# Patient Record
Sex: Female | Born: 1960 | Race: White | Hispanic: No | Marital: Married | Smoking: Never smoker
Health system: Southern US, Community
[De-identification: ages and names within clinical notes are randomized; demographics above are authoritative.]

## PROBLEM LIST (undated history)

## (undated) DIAGNOSIS — I10 Essential (primary) hypertension: Secondary | ICD-10-CM

## (undated) HISTORY — PX: CHOLECYSTECTOMY: SHX55

## (undated) HISTORY — DX: Essential (primary) hypertension: I10

---

## 2002-03-23 HISTORY — PX: ABDOMINAL HYSTERECTOMY: SHX81

## 2012-06-09 ENCOUNTER — Other Ambulatory Visit: Payer: Self-pay | Admitting: *Deleted

## 2012-06-09 MED ORDER — ATENOLOL 50 MG PO TABS: 50.0000 mg | ORAL_TABLET | Freq: Every day | ORAL | Status: DC

## 2012-06-09 NOTE — Telephone Encounter (Signed)
Request faxed back to [facility].

## 2012-12-23 ENCOUNTER — Other Ambulatory Visit: Payer: Self-pay | Admitting: Nurse Practitioner

## 2012-12-27 NOTE — Telephone Encounter (Signed)
Last seen 05/13/12  ACM

## 2012-12-27 NOTE — Telephone Encounter (Signed)
Patient needs to be seen. Has exceeded time since last visit. Needs to bring all medications to next appointment. Last refill. 

## 2013-01-30 ENCOUNTER — Other Ambulatory Visit: Payer: Self-pay | Admitting: Family Medicine

## 2013-02-10 ENCOUNTER — Encounter (INDEPENDENT_AMBULATORY_CARE_PROVIDER_SITE_OTHER): Payer: Self-pay

## 2013-02-10 ENCOUNTER — Encounter: Payer: Self-pay | Admitting: Physician Assistant

## 2013-02-10 ENCOUNTER — Ambulatory Visit (INDEPENDENT_AMBULATORY_CARE_PROVIDER_SITE_OTHER): Payer: 59 | Admitting: Physician Assistant

## 2013-02-10 VITALS — BP 143/68 | HR 53 | Temp 98.3°F | Ht 62.0 in | Wt 199.4 lb

## 2013-02-10 DIAGNOSIS — I10 Essential (primary) hypertension: Secondary | ICD-10-CM

## 2013-02-10 MED ORDER — ATENOLOL 50 MG PO TABS: 50.0000 mg | ORAL_TABLET | Freq: Every day | ORAL | Status: DC

## 2013-02-10 NOTE — Progress Notes (Signed)
  Subjective:    Patient ID: Jackie Ayala, female    DOB: 12-27-1960, 52 y.o.   MRN: [id number]  HPI 52 y/o female presents for refill of antihypertensive medication    Review of Systems  Constitutional: Negative.   HENT: Negative.   Eyes: Negative.   Respiratory: Negative.   Cardiovascular: Negative.   Neurological: Negative for dizziness, syncope, light-headedness, numbness and headaches.       Objective:   Physical Exam  Constitutional: She is oriented to person, place, and time. She appears well-developed and well-nourished. No distress.  Mildly obese  HENT:  Head: Normocephalic and atraumatic.  Right Ear: External ear normal.  Left Ear: External ear normal.  Mouth/Throat: Oropharynx is clear and moist. No oropharyngeal exudate.  Eyes: Pupils are equal, round, and reactive to light.  Cardiovascular: Normal rate, regular rhythm, normal heart sounds and intact distal pulses.  Exam reveals no gallop and no friction rub.   No murmur heard. Negative for BLE edema  Pulmonary/Chest: Effort normal and breath sounds normal. No respiratory distress. She has no wheezes. She has no rales. She exhibits no tenderness.  Abdominal: Soft.  Neurological: She is alert and oriented to person, place, and time.  Skin: She is not diaphoretic.  Psychiatric: She has a normal mood and affect. Thought content normal.          Assessment & Plan:  1. Hypertension: Refilled Atenolol x 3 months. Due to patients bradycardia, I would like for her to be reassessed in 2 1/2 - 3 months by Philomena Doheny, FNP since she is her PCP.

## 2013-05-04 ENCOUNTER — Encounter: Payer: Self-pay | Admitting: *Deleted

## 2013-05-17 ENCOUNTER — Ambulatory Visit: Payer: 59 | Admitting: General Practice

## 2013-05-19 ENCOUNTER — Ambulatory Visit: Payer: 59 | Admitting: General Practice

## 2013-05-24 ENCOUNTER — Other Ambulatory Visit: Payer: Self-pay | Admitting: Physician Assistant

## 2013-05-24 ENCOUNTER — Encounter: Payer: Self-pay | Admitting: *Deleted

## 2013-05-26 ENCOUNTER — Other Ambulatory Visit: Payer: Self-pay | Admitting: *Deleted

## 2013-05-26 DIAGNOSIS — I10 Essential (primary) hypertension: Secondary | ICD-10-CM

## 2013-05-26 MED ORDER — ATENOLOL 50 MG PO TABS: 50.0000 mg | ORAL_TABLET | Freq: Every day | ORAL | Status: DC

## 2013-06-16 ENCOUNTER — Encounter: Payer: Self-pay | Admitting: General Practice

## 2013-06-16 ENCOUNTER — Ambulatory Visit (INDEPENDENT_AMBULATORY_CARE_PROVIDER_SITE_OTHER): Payer: 59 | Admitting: General Practice

## 2013-06-16 VITALS — BP 144/83 | HR 61 | Temp 98.3°F | Ht 62.0 in | Wt 200.0 lb

## 2013-06-16 DIAGNOSIS — Z8249 Family history of ischemic heart disease and other diseases of the circulatory system: Secondary | ICD-10-CM

## 2013-06-16 DIAGNOSIS — Z87898 Personal history of other specified conditions: Secondary | ICD-10-CM

## 2013-06-16 DIAGNOSIS — R001 Bradycardia, unspecified: Secondary | ICD-10-CM

## 2013-06-16 DIAGNOSIS — K219 Gastro-esophageal reflux disease without esophagitis: Secondary | ICD-10-CM

## 2013-06-16 DIAGNOSIS — I498 Other specified cardiac arrhythmias: Secondary | ICD-10-CM

## 2013-06-16 DIAGNOSIS — I10 Essential (primary) hypertension: Secondary | ICD-10-CM

## 2013-06-16 LAB — POCT CBC
Granulocyte percent: 52.6 %G (ref 37–80)
HEMATOCRIT: 39.2 % (ref 37.7–47.9)
HEMOGLOBIN: 12.7 g/dL (ref 12.2–16.2)
LYMPH, POC: 2.2 (ref 0.6–3.4)
MCH, POC: 30.4 pg (ref 27–31.2)
MCHC: 32.5 g/dL (ref 31.8–35.4)
MCV: 93.6 fL (ref 80–97)
MPV: 6.8 fL (ref 0–99.8)
POC GRANULOCYTE: 2.9 (ref 2–6.9)
POC LYMPH PERCENT: 40 %L (ref 10–50)
Platelet Count, POC: 246 10*3/uL (ref 142–424)
RBC: 4.2 M/uL (ref 4.04–5.48)
RDW, POC: 12.5 %
WBC: 5.5 10*3/uL (ref 4.6–10.2)

## 2013-06-16 MED ORDER — ATENOLOL 50 MG PO TABS: 50.0000 mg | ORAL_TABLET | Freq: Every day | ORAL | Status: DC

## 2013-06-16 MED ORDER — OMEPRAZOLE 40 MG PO CPDR: 40.0000 mg | DELAYED_RELEASE_CAPSULE | Freq: Every day | ORAL | Status: DC

## 2013-06-16 NOTE — Progress Notes (Signed)
   Subjective:    Patient ID: Redonna Wilbert, female    DOB: Mar 18, 1961, 53 y.o.   MRN: [id number]  HPI Patient presents today for follow up of blood pressure. Also history of GERD. Taking medications as directed. Denies regular exercise or healthy eating. Checks blood pressure periodically at home and ranges 130's/80.     Review of Systems  Constitutional: Negative for fever and chills.  Respiratory: Negative for chest tightness and shortness of breath.   Cardiovascular: Negative for chest pain and palpitations.  Gastrointestinal: Negative for nausea, vomiting, abdominal pain, diarrhea, constipation and blood in stool.  Neurological: Negative for dizziness, weakness and headaches.       Objective:   Physical Exam  Constitutional: She is oriented to person, place, and time. She appears well-developed and well-nourished.  HENT:  Head: Normocephalic and atraumatic.  Right Ear: External ear normal.  Left Ear: External ear normal.  Eyes: Conjunctivae and EOM are normal.  Neck: Normal range of motion. Neck supple. No thyromegaly present.  Cardiovascular: Normal rate, regular rhythm and normal heart sounds.   Pulmonary/Chest: Effort normal and breath sounds normal.  Abdominal: Soft. Bowel sounds are normal. She exhibits no distension. There is no tenderness.  Lymphadenopathy:    She has no cervical adenopathy.  Neurological: She is alert and oriented to person, place, and time.  Skin: Skin is warm and dry.  Psychiatric: She has a normal mood and affect.          Assessment & Plan:  1. Hypertension  - POCT CBC - CMP14+EGFR - NMR, lipoprofile - atenolol (TENORMIN) 50 MG tablet; Take 1 tablet (50 mg total) by mouth daily.  Dispense: 30 tablet; Refill: 5  2. Hx of chest pain  - EKG 12-Lead; Standing - EKG 12-Lead - Ambulatory referral to Cardiology  3. Family history of heart disease  - Ambulatory referral to Cardiology  4. Unspecified essential hypertension   5. GERD  (gastroesophageal reflux disease)  - omeprazole (PRILOSEC) 40 MG capsule; Take 1 capsule (40 mg total) by mouth daily.  Dispense: 30 capsule; Refill: 11  6. Sinus bradycardia  - Ambulatory referral to Cardiology - Thyroid Panel With TSH -Continue all current medications Labs pending F/u in 3 months Discussed benefits of regular exercise and healthy eating Patient verbalized understanding Erby Pian, FNP-C

## 2013-06-16 NOTE — Patient Instructions (Signed)

## 2013-06-18 LAB — CMP14+EGFR
A/G RATIO: 1.4 (ref 1.1–2.5)
ALBUMIN: 4.2 g/dL (ref 3.5–5.5)
ALT: 29 IU/L (ref 0–32)
AST: 25 IU/L (ref 0–40)
Alkaline Phosphatase: 58 IU/L (ref 39–117)
BILIRUBIN TOTAL: 0.5 mg/dL (ref 0.0–1.2)
BUN/Creatinine Ratio: 14 (ref 9–23)
BUN: 13 mg/dL (ref 6–24)
CALCIUM: 9.4 mg/dL (ref 8.7–10.2)
CO2: 26 mmol/L (ref 18–29)
CREATININE: 0.91 mg/dL (ref 0.57–1.00)
Chloride: 100 mmol/L (ref 97–108)
GFR, EST AFRICAN AMERICAN: 82 mL/min/{1.73_m2} (ref >59)
GFR, EST NON AFRICAN AMERICAN: 71 mL/min/{1.73_m2} (ref >59)
GLOBULIN, TOTAL: 3 g/dL (ref 1.5–4.5)
GLUCOSE: 98 mg/dL (ref 65–99)
Potassium: 4 mmol/L (ref 3.5–5.2)
Sodium: 141 mmol/L (ref 134–144)
TOTAL PROTEIN: 7.2 g/dL (ref 6.0–8.5)

## 2013-06-18 LAB — NMR, LIPOPROFILE
Cholesterol: 166 mg/dL (ref <200)
HDL CHOLESTEROL BY NMR: 49 mg/dL (ref >=40)
HDL Particle Number: 39.1 umol/L (ref >=30.5)
LDL Particle Number: 961 nmol/L (ref <1000)
LDL Size: 21.1 nm (ref >20.5)
LDLC SERPL CALC-MCNC: 94 mg/dL (ref <100)
LP-IR Score: 54 — ABNORMAL HIGH (ref <=45)
SMALL LDL PARTICLE NUMBER: 319 nmol/L (ref <=527)
Triglycerides by NMR: 116 mg/dL (ref <150)

## 2013-06-19 LAB — SPECIMEN STATUS REPORT

## 2013-06-19 LAB — TSH: TSH: 2.04 u[IU]/mL (ref 0.450–4.500)

## 2013-06-19 LAB — T3: T3, Total: 121 ng/dL (ref 71–180)

## 2013-06-19 LAB — T4: T4, Total: 8.2 ug/dL (ref 4.5–12.0)

## 2013-07-05 ENCOUNTER — Ambulatory Visit: Payer: 59 | Admitting: General Practice

## 2013-07-07 ENCOUNTER — Ambulatory Visit: Payer: 59 | Admitting: Physician Assistant

## 2013-07-07 ENCOUNTER — Ambulatory Visit: Payer: 59 | Admitting: General Practice

## 2013-07-13 ENCOUNTER — Institutional Professional Consult (permissible substitution): Payer: Self-pay | Admitting: Cardiology

## 2013-07-20 ENCOUNTER — Encounter: Payer: Self-pay | Admitting: Cardiology

## 2013-07-28 ENCOUNTER — Institutional Professional Consult (permissible substitution): Payer: Self-pay | Admitting: Cardiology

## 2013-11-03 ENCOUNTER — Ambulatory Visit (INDEPENDENT_AMBULATORY_CARE_PROVIDER_SITE_OTHER): Payer: 59 | Admitting: Family

## 2013-11-03 ENCOUNTER — Encounter: Payer: Self-pay | Admitting: Family

## 2013-11-03 VITALS — BP 145/82 | HR 52 | Temp 97.1°F | Ht 62.0 in | Wt 200.0 lb

## 2013-11-03 DIAGNOSIS — M545 Low back pain, unspecified: Secondary | ICD-10-CM

## 2013-11-03 DIAGNOSIS — M25561 Pain in right knee: Secondary | ICD-10-CM

## 2013-11-03 DIAGNOSIS — M25569 Pain in unspecified knee: Secondary | ICD-10-CM

## 2013-11-03 MED ORDER — MELOXICAM 15 MG PO TABS: 15.0000 mg | ORAL_TABLET | Freq: Every day | ORAL | Status: DC

## 2013-11-03 MED ORDER — CYCLOBENZAPRINE HCL 5 MG PO TABS: 5.0000 mg | ORAL_TABLET | Freq: Three times a day (TID) | ORAL | Status: DC | PRN

## 2013-11-03 NOTE — Patient Instructions (Signed)

## 2013-11-03 NOTE — Progress Notes (Signed)
   Subjective:    Patient ID: Jackie Ayala, female    DOB: 01-29-1961, 53 y.o.   MRN: [id number]  Knee Pain  The incident occurred more than 1 week ago (Pt states it has been hurting for months but last two weeks have worsen). There was no injury mechanism. The pain is present in the right knee. The quality of the pain is described as shooting and aching. The pain is at a severity of 8/10. The pain is moderate. The pain has been constant since onset. Associated symptoms include a loss of sensation and muscle weakness. Pertinent negatives include no loss of motion, numbness or tingling. She reports no foreign bodies present. The symptoms are aggravated by movement and weight bearing. She has tried rest, ice and NSAIDs for the symptoms. The treatment provided mild relief.      Review of Systems  Constitutional: Negative.   HENT: Negative.   Eyes: Negative.   Respiratory: Negative.  Negative for shortness of breath.   Cardiovascular: Negative.  Negative for palpitations.  Gastrointestinal: Negative.   Endocrine: Negative.   Genitourinary: Negative.   Musculoskeletal: Positive for back pain and gait problem.  Neurological: Negative.  Negative for tingling, numbness and headaches.  Hematological: Negative.   Psychiatric/Behavioral: Negative.   All other systems reviewed and are negative.      Objective:   Physical Exam  Vitals reviewed. Constitutional: She is oriented to person, place, and time. She appears well-developed and well-nourished. No distress.  Neck: Normal range of motion. Neck supple. No thyromegaly present.  Cardiovascular: Normal rate, regular rhythm, normal heart sounds and intact distal pulses.   No murmur heard. Pulmonary/Chest: Effort normal and breath sounds normal. No respiratory distress. She has no wheezes.  Abdominal: Soft. Bowel sounds are normal. She exhibits no distension. There is no tenderness.  Musculoskeletal: Normal range of motion. She exhibits no edema  and no tenderness.  Neurological: She is alert and oriented to person, place, and time. She has normal reflexes. No cranial nerve deficit.  Skin: Skin is warm and dry.  Psychiatric: She has a normal mood and affect. Her behavior is normal. Judgment and thought content normal.    BP 145/82  Pulse 52  Temp(Src) 97.1 F (36.2 C) (Oral)  Ht $R'5\' 2"'Uq$  (1.575 m)  Wt 200 lb (90.719 kg)  BMI 36.57 kg/m2       Assessment & Plan:  1. Right knee pain -Rest -Ice -No other NSAIDS -Discussed drowsiness of flexeril and may can only take at night - Ambulatory referral to Orthopedic Surgery - cyclobenzaprine (FLEXERIL) 5 MG tablet; Take 1 tablet (5 mg total) by mouth 3 (three) times daily as needed for muscle spasms.  Dispense: 30 tablet; Refill: 1 - meloxicam (MOBIC) 15 MG tablet; Take 1 tablet (15 mg total) by mouth daily.  Dispense: 30 tablet; Refill: 1 - BMP8+EGFR  2. Low back pain without sciatica, unspecified back pain laterality   Evelina Dun, FNP

## 2013-11-04 LAB — BMP8+EGFR
BUN / CREAT RATIO: 16 (ref 9–23)
BUN: 15 mg/dL (ref 6–24)
CHLORIDE: 103 mmol/L (ref 97–108)
CO2: 26 mmol/L (ref 18–29)
Calcium: 9.3 mg/dL (ref 8.7–10.2)
Creatinine, Ser: 0.92 mg/dL (ref 0.57–1.00)
GFR calc Af Amer: 81 mL/min/{1.73_m2} (ref >59)
GFR calc non Af Amer: 70 mL/min/{1.73_m2} (ref >59)
Glucose: 89 mg/dL (ref 65–99)
POTASSIUM: 5.1 mmol/L (ref 3.5–5.2)
SODIUM: 141 mmol/L (ref 134–144)

## 2013-12-20 ENCOUNTER — Other Ambulatory Visit: Payer: Self-pay | Admitting: Nurse Practitioner

## 2014-06-05 ENCOUNTER — Other Ambulatory Visit: Payer: Self-pay | Admitting: Family

## 2014-06-05 NOTE — Telephone Encounter (Signed)
Last seen 11/03/13 [facility]

## 2014-06-12 ENCOUNTER — Ambulatory Visit (INDEPENDENT_AMBULATORY_CARE_PROVIDER_SITE_OTHER): Payer: 59 | Admitting: Nurse Practitioner

## 2014-06-12 ENCOUNTER — Encounter: Payer: Self-pay | Admitting: Nurse Practitioner

## 2014-06-12 VITALS — BP 133/81 | HR 68 | Temp 97.5°F | Ht 62.0 in | Wt 194.6 lb

## 2014-06-12 DIAGNOSIS — J069 Acute upper respiratory infection, unspecified: Secondary | ICD-10-CM | POA: Diagnosis not present

## 2014-06-12 DIAGNOSIS — K219 Gastro-esophageal reflux disease without esophagitis: Secondary | ICD-10-CM | POA: Insufficient documentation

## 2014-06-12 DIAGNOSIS — I10 Essential (primary) hypertension: Secondary | ICD-10-CM | POA: Insufficient documentation

## 2014-06-12 MED ORDER — HYDROCODONE-HOMATROPINE 5-1.5 MG/5ML PO SYRP: 5.0000 mL | ORAL_SOLUTION | Freq: Four times a day (QID) | ORAL | Status: DC | PRN

## 2014-06-12 MED ORDER — DOXYCYCLINE HYCLATE 100 MG PO TABS: 100.0000 mg | ORAL_TABLET | Freq: Two times a day (BID) | ORAL | Status: DC

## 2014-06-12 NOTE — Progress Notes (Signed)
  Subjective:     Ayshia Gramlich is a 54 y.o. female here for evaluation of a cough. Onset of symptoms was several weeks ago. Symptoms have been unchanged since that time. The cough is dry and productive of yellow and green sputum and is aggravated by cold air. Associated symptoms include: change in voice, sputum production and congestion.. Patient does not have a history of asthma. Patient does not have a history of environmental allergens. Patient has not traveled recently. Patient does not have a history of smoking. Patient has not had a previous chest x-ray. Patient has not had a PPD done.  The following portions of the patient's history were reviewed and updated as appropriate: allergies, current medications, past family history, past medical history, past social history, past surgical history and problem list.  Review of Systems Pertinent items are noted in HPI.    Objective:     BP 133/81 mmHg  Pulse 68  Temp(Src) 97.5 F (36.4 C) (Oral)  Ht 5\' 2"  (1.575 m)  Wt 194 lb 9.6 oz (88.27 kg)  BMI 35.58 kg/m2 General appearance: alert, cooperative, appears stated age, flushed and no distress Eyes: conjunctivae/corneas clear. PERRL, EOM's intact. Fundi benign. Ears: normal TM's and external ear canals both ears Nose: Nares normal. Septum midline. Mucosa normal. No drainage or sinus tenderness. Throat: lips, mucosa, and tongue normal; teeth and gums normal Neck: no adenopathy and supple, symmetrical, trachea midline Lungs: clear to auscultation bilaterally Heart: regular rate and rhythm, S1, S2 normal, no murmur, click, rub or gallop    Assessment:     Upper resp infection with cough   Plan:       Meds ordered this encounter  Medications  . doxycycline (VIBRA-TABS) 100 MG tablet    Sig: Take 1 tablet (100 mg total) by mouth 2 (two) times daily. 1 po bid    Dispense:  20 tablet    Refill:  0    Order Specific Question:  Supervising Provider    Answer:  Chipper Herb [[id number]]   . HYDROcodone-homatropine (HYCODAN) 5-1.5 MG/5ML syrup    Sig: Take 5 mLs by mouth every 6 (six) hours as needed for cough.    Dispense:  120 mL    Refill:  0    Order Specific Question:  Supervising Provider    Answer:  Chipper Herb [[id number]]   1. Take meds as prescribed 2. Use a cool mist humidifier especially during the winter months and when heat has been humid. 3. Use saline nose sprays frequently 4. Saline irrigations of the nose can be very helpful if done frequently.  * 4X daily for 1 week*  * Use of a nettie pot can be helpful with this. Follow directions with this* 5. Drink plenty of fluids 6. Keep thermostat turn down low 7.For any cough or congestion  Use plain Mucinex- regular strength or max strength is fine   * Children- consult with Pharmacist for dosing 8. For fever or aces or pains- take tylenol or ibuprofen appropriate for age and weight.  * for fevers greater than 101 orally you may alternate ibuprofen and tylenol every  3 hours.   Mary-Margaret Hassell Done, FNP

## 2014-06-12 NOTE — Patient Instructions (Signed)

## 2015-05-03 LAB — HM MAMMOGRAPHY: HM Mammogram: NEGATIVE

## 2015-05-24 ENCOUNTER — Encounter: Payer: Self-pay | Admitting: Pediatrics

## 2015-05-24 ENCOUNTER — Ambulatory Visit (INDEPENDENT_AMBULATORY_CARE_PROVIDER_SITE_OTHER): Payer: 59 | Admitting: Pediatrics

## 2015-05-24 VITALS — BP 136/76 | HR 56 | Temp 97.3°F | Ht 62.0 in | Wt 187.2 lb

## 2015-05-24 DIAGNOSIS — R1012 Left upper quadrant pain: Secondary | ICD-10-CM | POA: Diagnosis not present

## 2015-05-24 DIAGNOSIS — I10 Essential (primary) hypertension: Secondary | ICD-10-CM | POA: Diagnosis not present

## 2015-05-24 DIAGNOSIS — J069 Acute upper respiratory infection, unspecified: Secondary | ICD-10-CM | POA: Diagnosis not present

## 2015-05-24 NOTE — Progress Notes (Signed)
    Subjective:    Patient ID: Jackie Ayala, female    DOB: 1960/07/28, 55 y.o.   MRN: [id number]  CC: Nasal Congestion and Hoarse   HPI: Jackie Ayala is a 55 y.o. female presenting for Nasal Congestion and Hoarse  Last night lost her voice No nausea, abd pain Hacky cough Spitting up mucus Taking nyquil, dayquil  Also has had persistent LUQ pain for some years She has had a noticeable mass in LUQ when she stands Is not sure if it has been growing or not, thinks it has been there for past several years Daughter recently found to have LUQ mass on her spleen requiring splenectomy, pathology is still pending Appetite normal, no weight loss Normal stool habits   Depression screen PHQ 2/9 05/24/2015  Decreased Interest 0  Down, Depressed, Hopeless 0  PHQ - 2 Score 0     Relevant past medical, surgical, family and social history reviewed and updated as indicated. Interim medical history since our last visit reviewed. Allergies and medications reviewed and updated.    ROS: Per HPI unless specifically indicated above  History  Smoking status  . Never Smoker   Smokeless tobacco  . Not on file    Past Medical History Patient Active Problem List   Diagnosis Date Noted  . Essential hypertension 06/12/2014  . GERD (gastroesophageal reflux disease) 06/12/2014    Current Outpatient Prescriptions  Medication Sig Dispense Refill  . atenolol (TENORMIN) 50 MG tablet TAKE 1 TABLET (50 MG TOTAL) BY MOUTH DAILY. 90 tablet 4  . omeprazole (PRILOSEC) 40 MG capsule Take 1 capsule (40 mg total) by mouth daily. (Patient not taking: Reported on 05/24/2015) 30 capsule 11   No current facility-administered medications for this visit.       Objective:    BP 136/76 mmHg  Pulse 56  Temp(Src) 97.3 F (36.3 C) (Oral)  Ht _0  (1.575 m)  Wt 187 lb 3.2 oz (84.913 kg)  BMI 34.23 kg/m2  Wt Readings from Last 3 Encounters:  05/24/15 187 lb 3.2 oz (84.913 kg)  06/12/14 194 lb 9.6 oz  (88.27 kg)  11/03/13 200 lb (90.719 kg)     Gen: NAD, alert, cooperative with exam, NCAT, hoarse voice EYES: EOMI, no scleral injection or icterus ENT:  TMs dull gray b/l, OP with mild erythema LYMPH: no cervical LAD CV: NRRR, normal S1/S2, no murmur, distal pulses 2+ b/l Resp: CTABL, no wheezes, normal WOB Abd: +BS, soft, NTND. no guarding, when she stands and raises her arms, has mass LUQ, soft, non tender that is not present in RUQ Ext: No edema, warm Neuro: Alert and oriented, strength equal b/l UE and LE, coordination grossly normal MSK: normal muscle bulk     Assessment & Plan:    Jackie Ayala was seen today for below problems.  Diagnoses and all orders for this visit:  Left upper quadrant pain Not clear etiology. Pt very concerned with daughter's recent difficulties of LUQ mass, although pt hasnt had the pain she has a very similar mass to that of her daughter's. Will get labs as below, CT scan for eval. -     CMP14+EGFR -     CBC with Differential -     CT Abdomen Pelvis W Contrast; Future  Acute URI Discussed sx care  Essential hypertension Continue atenolol   Follow up plan: Return in about 4 weeks (around 06/21/2015).  Assunta Found, MD [facility] 05/24/2015, 2:58 PM d

## 2015-05-25 LAB — CBC WITH DIFFERENTIAL/PLATELET
Basophils Absolute: 0 10*3/uL (ref 0.0–0.2)
Basos: 0 %
EOS (ABSOLUTE): 0.1 10*3/uL (ref 0.0–0.4)
EOS: 2 %
HEMATOCRIT: 38.4 % (ref 34.0–46.6)
Hemoglobin: 13 g/dL (ref 11.1–15.9)
IMMATURE GRANULOCYTES: 0 %
Immature Grans (Abs): 0 10*3/uL (ref 0.0–0.1)
LYMPHS ABS: 3 10*3/uL (ref 0.7–3.1)
Lymphs: 45 %
MCH: 30.7 pg (ref 26.6–33.0)
MCHC: 33.9 g/dL (ref 31.5–35.7)
MCV: 91 fL (ref 79–97)
MONOS ABS: 0.5 10*3/uL (ref 0.1–0.9)
Monocytes: 7 %
NEUTROS PCT: 46 %
Neutrophils Absolute: 3.1 10*3/uL (ref 1.4–7.0)
PLATELETS: 302 10*3/uL (ref 150–379)
RBC: 4.24 x10E6/uL (ref 3.77–5.28)
RDW: 13.1 % (ref 12.3–15.4)
WBC: 6.8 10*3/uL (ref 3.4–10.8)

## 2015-05-25 LAB — CMP14+EGFR
ALBUMIN: 4.2 g/dL (ref 3.5–5.5)
ALK PHOS: 75 IU/L (ref 39–117)
ALT: 21 IU/L (ref 0–32)
AST: 18 IU/L (ref 0–40)
Albumin/Globulin Ratio: 1.2 (ref 1.1–2.5)
BILIRUBIN TOTAL: 0.4 mg/dL (ref 0.0–1.2)
BUN / CREAT RATIO: 14 (ref 9–23)
BUN: 12 mg/dL (ref 6–24)
CO2: 25 mmol/L (ref 18–29)
Calcium: 9.6 mg/dL (ref 8.7–10.2)
Chloride: 99 mmol/L (ref 96–106)
Creatinine, Ser: 0.86 mg/dL (ref 0.57–1.00)
GFR calc Af Amer: 87 mL/min/{1.73_m2} (ref >59)
GFR calc non Af Amer: 75 mL/min/{1.73_m2} (ref >59)
GLUCOSE: 92 mg/dL (ref 65–99)
Globulin, Total: 3.5 g/dL (ref 1.5–4.5)
POTASSIUM: 4.6 mmol/L (ref 3.5–5.2)
SODIUM: 140 mmol/L (ref 134–144)
Total Protein: 7.7 g/dL (ref 6.0–8.5)

## 2015-06-11 ENCOUNTER — Telehealth: Payer: Self-pay | Admitting: Family

## 2015-06-11 MED ORDER — OSELTAMIVIR PHOSPHATE 75 MG PO CAPS: 75.0000 mg | ORAL_CAPSULE | Freq: Every day | ORAL | Status: DC

## 2015-06-11 NOTE — Telephone Encounter (Signed)
tamiflu rx sent to pharmacy

## 2015-06-13 ENCOUNTER — Ambulatory Visit
Admission: RE | Admit: 2015-06-13 | Discharge: 2015-06-13 | Disposition: A | Discharge status: Home / Self Care | Payer: Managed Care, Other (non HMO) | Source: Ambulatory Visit | Attending: Pediatrics | Admitting: Pediatrics

## 2015-06-13 ENCOUNTER — Encounter: Payer: Self-pay | Admitting: *Deleted

## 2015-06-13 DIAGNOSIS — R1012 Left upper quadrant pain: Secondary | ICD-10-CM

## 2015-06-13 MED ORDER — IOPAMIDOL (ISOVUE-300) INJECTION 61%
100.0000 mL | Freq: Once | INTRAVENOUS | Status: AC | PRN
  Administered 2015-06-13: 100 mL via INTRAVENOUS

## 2015-09-01 ENCOUNTER — Other Ambulatory Visit: Payer: Self-pay | Admitting: Family

## 2015-09-17 ENCOUNTER — Other Ambulatory Visit: Payer: Self-pay | Admitting: Family

## 2015-12-12 ENCOUNTER — Ambulatory Visit (INDEPENDENT_AMBULATORY_CARE_PROVIDER_SITE_OTHER): Payer: Self-pay | Admitting: Nurse Practitioner

## 2015-12-12 ENCOUNTER — Encounter: Payer: Self-pay | Admitting: Nurse Practitioner

## 2015-12-12 ENCOUNTER — Other Ambulatory Visit: Payer: Self-pay

## 2015-12-12 VITALS — BP 141/87 | HR 63 | Temp 96.9°F | Ht 62.0 in | Wt 202.0 lb

## 2015-12-12 DIAGNOSIS — L200 Besnier's prurigo: Secondary | ICD-10-CM

## 2015-12-12 DIAGNOSIS — L239 Allergic contact dermatitis, unspecified cause: Secondary | ICD-10-CM

## 2015-12-12 DIAGNOSIS — K219 Gastro-esophageal reflux disease without esophagitis: Secondary | ICD-10-CM

## 2015-12-12 MED ORDER — METHYLPREDNISOLONE ACETATE 80 MG/ML IJ SUSP
80.0000 mg | Freq: Once | INTRAMUSCULAR | Status: AC
  Administered 2015-12-12: 80 mg via INTRAMUSCULAR

## 2015-12-12 MED ORDER — OMEPRAZOLE 40 MG PO CPDR: 40.0000 mg | DELAYED_RELEASE_CAPSULE | Freq: Every day | ORAL | 3 refills | Status: DC

## 2015-12-12 NOTE — Patient Instructions (Signed)

## 2015-12-12 NOTE — Progress Notes (Signed)
   Subjective:    Patient ID: Jackie Ayala, female    DOB: 1960/11/08, 55 y.o.   MRN: [id number]  HPI Patient in today c/o rash allover her. Started Tuesday morning- very itchy. Denies any new foods, soaps or detergents. SHe brought her husband in for rash and provider gave both of them prednisone for rash. SHe finished prednisone Saturday     ( only took 20 mg a day for 5 days ) and rash reoccurred on Tuesday.    Review of Systems  Constitutional: Negative.   HENT: Negative.   Respiratory: Negative.   Cardiovascular: Negative.   Gastrointestinal: Negative.   Genitourinary: Negative.   Skin: Positive for rash.  Neurological: Negative.   Psychiatric/Behavioral: Negative.   All other systems reviewed and are negative.      Objective:   Physical Exam  Constitutional: She appears well-developed and well-nourished. No distress.  Cardiovascular: Normal rate, regular rhythm and normal heart sounds.   Pulmonary/Chest: Effort normal and breath sounds normal.  Abdominal: Soft. Bowel sounds are normal.  Neurological: She is alert.  Skin: Skin is warm. Rash (erythematous maculo-papular lesions all over trunk- avoiding arma and legs) noted.  Psychiatric: She has a normal mood and affect. Her behavior is normal. Judgment and thought content normal.   BP (!) 141/87   Pulse 63   Temp (!) 96.9 F (36.1 C) (Oral)   Ht 5\' 2"  (1.575 m)   Wt 202 lb (91.6 kg)   BMI 36.95 kg/m        Assessment & Plan:   1. Allergic dermatitis    Meds ordered this encounter  Medications  . methylPREDNISolone acetate (DEPO-MEDROL) injection 80 mg   Avoid scratching Benadryl OTC RTO prn  Mary-Margaret Hassell Done, FNP

## 2015-12-14 ENCOUNTER — Other Ambulatory Visit: Payer: Self-pay | Admitting: Family

## 2016-01-07 ENCOUNTER — Ambulatory Visit (INDEPENDENT_AMBULATORY_CARE_PROVIDER_SITE_OTHER): Payer: Self-pay

## 2016-01-07 DIAGNOSIS — Z23 Encounter for immunization: Secondary | ICD-10-CM

## 2016-03-06 ENCOUNTER — Encounter: Payer: Self-pay | Admitting: Physician Assistant

## 2016-03-06 ENCOUNTER — Ambulatory Visit (INDEPENDENT_AMBULATORY_CARE_PROVIDER_SITE_OTHER): Payer: Self-pay | Admitting: Physician Assistant

## 2016-03-06 ENCOUNTER — Encounter (INDEPENDENT_AMBULATORY_CARE_PROVIDER_SITE_OTHER): Payer: Self-pay

## 2016-03-06 VITALS — BP 147/70 | HR 62 | Temp 98.2°F | Ht 62.0 in | Wt 207.2 lb

## 2016-03-06 DIAGNOSIS — B373 Candidiasis of vulva and vagina: Secondary | ICD-10-CM

## 2016-03-06 DIAGNOSIS — B3731 Acute candidiasis of vulva and vagina: Secondary | ICD-10-CM

## 2016-03-06 MED ORDER — FLUCONAZOLE 150 MG PO TABS: 150.0000 mg | ORAL_TABLET | Freq: Once | ORAL | 1 refills | Status: AC

## 2016-03-06 NOTE — Patient Instructions (Signed)

## 2016-03-06 NOTE — Progress Notes (Signed)
   BP (!) 159/77   Pulse 62   Temp 98.2 F (36.8 C) (Oral)   Ht 5\' 2"  (1.575 m)   Wt 207 lb 3.2 oz (94 kg)   BMI 37.90 kg/m    Subjective:    Patient ID: Jackie Ayala, female    DOB: 15-Jan-1961, 55 y.o.   MRN: [id number]  HPI: Jackie Ayala is a 55 y.o. female presenting on 03/06/2016 for vaginal burning  Patient has had vaginal burning for several days. She has not had any over-the-counter medications. She has had an issue with his infections in the past. She denies any urinary tract symptoms such as frequency burning or urine changes.  Relevant past medical, surgical, family and social history reviewed and updated as indicated. Allergies and medications reviewed and updated.  Past Medical History:  Diagnosis Date  . Hypertension     History reviewed. No pertinent surgical history.  Review of Systems  Constitutional: Negative.   HENT: Negative.   Eyes: Negative.   Respiratory: Negative.   Gastrointestinal: Negative.   Genitourinary: Negative.     Allergies as of 03/06/2016      Reactions   Penicillins    Sulfa Antibiotics       Medication List       Accurate as of 03/06/16  5:26 PM. Always use your most recent med list.          atenolol 50 MG tablet Commonly known as:  TENORMIN TAKE 1 TABLET (50 MG TOTAL) BY MOUTH DAILY.   fluconazole 150 MG tablet Commonly known as:  DIFLUCAN Take 1 tablet (150 mg total) by mouth once.   omeprazole 40 MG capsule Commonly known as:  PRILOSEC Take 1 capsule (40 mg total) by mouth daily.          Objective:    BP (!) 159/77   Pulse 62   Temp 98.2 F (36.8 C) (Oral)   Ht 5\' 2"  (1.575 m)   Wt 207 lb 3.2 oz (94 kg)   BMI 37.90 kg/m   Allergies  Allergen Reactions  . Penicillins   . Sulfa Antibiotics     Physical Exam  Constitutional: She is oriented to person, place, and time. She appears well-developed and well-nourished.  HENT:  Head: Normocephalic and atraumatic.  Eyes: Conjunctivae and EOM are  normal. Pupils are equal, round, and reactive to light.  Cardiovascular: Normal rate, regular rhythm, normal heart sounds and intact distal pulses.   Pulmonary/Chest: Effort normal and breath sounds normal.  Abdominal: Soft. Bowel sounds are normal.  Neurological: She is alert and oriented to person, place, and time. She has normal reflexes.  Skin: Skin is warm and dry. No rash noted.  Psychiatric: She has a normal mood and affect. Her behavior is normal. Judgment and thought content normal.      Assessment & Plan:   1. Candida vaginitis - fluconazole (DIFLUCAN) 150 MG tablet; Take 1 tablet (150 mg total) by mouth once.  Dispense: 1 tablet; Refill: 1  Continue all other maintenance medications as listed above.  Follow up plan: Return if symptoms worsen or fail to improve.  Educational handout given for candida yeast  Terald Sleeper PA-C [facility] [address]  [place], [place] [address] [phone]   03/06/2016, 5:26 PM

## 2016-05-13 ENCOUNTER — Other Ambulatory Visit: Payer: Self-pay | Admitting: Family

## 2016-05-17 ENCOUNTER — Other Ambulatory Visit: Payer: Self-pay | Admitting: Nurse Practitioner

## 2016-05-17 DIAGNOSIS — K219 Gastro-esophageal reflux disease without esophagitis: Secondary | ICD-10-CM

## 2016-08-27 ENCOUNTER — Encounter: Payer: Self-pay | Admitting: Family

## 2016-08-27 ENCOUNTER — Ambulatory Visit (INDEPENDENT_AMBULATORY_CARE_PROVIDER_SITE_OTHER): Payer: Self-pay | Admitting: Family

## 2016-08-27 VITALS — BP 148/108 | HR 63 | Temp 97.3°F | Ht 62.0 in | Wt 209.8 lb

## 2016-08-27 DIAGNOSIS — S80862A Insect bite (nonvenomous), left lower leg, initial encounter: Secondary | ICD-10-CM

## 2016-08-27 DIAGNOSIS — W57XXXA Bitten or stung by nonvenomous insect and other nonvenomous arthropods, initial encounter: Secondary | ICD-10-CM

## 2016-08-27 DIAGNOSIS — E669 Obesity, unspecified: Secondary | ICD-10-CM

## 2016-08-27 DIAGNOSIS — S80861A Insect bite (nonvenomous), right lower leg, initial encounter: Secondary | ICD-10-CM

## 2016-08-27 MED ORDER — DOXYCYCLINE HYCLATE 100 MG PO TABS: 100.0000 mg | ORAL_TABLET | Freq: Two times a day (BID) | ORAL | 0 refills | Status: DC

## 2016-08-27 NOTE — Progress Notes (Signed)
   Subjective:    Patient ID: Jackie Ayala, female    DOB: Aug 27, 1960, 56 y.o.   MRN: [id number]  HPI PT presents to the office today with erythemas lesion on bilateral legs that itch and tender. PT states she spends a lot of time outside and her grass was tall while she was outside. Pt has tried calmoseptine lotion with moderate relief. PT states she is having tenderness of 7 out 10.    Review of Systems  Skin: Positive for rash.  All other systems reviewed and are negative.      Objective:   Physical Exam  Constitutional: She is oriented to person, place, and time. She appears well-developed and well-nourished. No distress.  HENT:  Head: Normocephalic.  Cardiovascular: Normal rate, regular rhythm, normal heart sounds and intact distal pulses.   No murmur heard. Pulmonary/Chest: Effort normal and breath sounds normal. No respiratory distress. She has no wheezes.  Abdominal: Soft. Bowel sounds are normal. She exhibits no distension. There is no tenderness.  Musculoskeletal: Normal range of motion. She exhibits no edema or tenderness.  Neurological: She is alert and oriented to person, place, and time.  Skin: Skin is warm and dry. Rash noted.  Erythemas lesion 2.5X2.0 Cm on right lower leg Erythemas lesion 2.5X.2.0 cm on left lower leg  Psychiatric: She has a normal mood and affect. Her behavior is normal. Judgment and thought content normal.  Vitals reviewed.        BP (!) 148/108   Pulse 63   Temp 97.3 F (36.3 C) (Oral)   Ht 5\' 2"  (1.575 m)   Wt 209 lb 12.8 oz (95.2 kg)   BMI 38.37 kg/m       Assessment & Plan:  1. Insect bite, initial encounter -Do not scratch or pick -Wear protective clothing while outside- Long sleeves and long pants -Put insect repellent on all exposed skin and along clothing -Take a shower as soon as possible after being outside RTO prn  - doxycycline (VIBRA-TABS) 100 MG tablet; Take 1 tablet (100 mg total) by mouth 2 (two) times daily.   Dispense: 20 tablet; Refill: 0  2. Obesity (BMI 35.0-39.9 without comorbidity)   Evelina Dun, FNP

## 2016-08-27 NOTE — Patient Instructions (Signed)
Insect Bite, Adult An insect bite can make your skin red, itchy, and swollen. An insect bite is different from an insect sting, which happens when an insect injects poison (venom) into the skin. Some insects can spread disease to people through a bite. However, most insect bites do not lead to disease and are not serious. What are the causes? Insects may bite for a variety of reasons, including:  Hunger.  To defend themselves.  Insects that bite include:  Spiders.  Mosquitoes.  Ticks.  Fleas.  Ants.  Flies.  Bedbugs.  What are the signs or symptoms? Symptoms of this condition include:  Itching or pain in the bite area.  Redness and swelling in the bite area.  An open wound (skin ulcer).  In many cases, symptoms last for 2-4 days. How is this diagnosed? This condition is usually diagnosed based on symptoms and a physical exam. How is this treated? Treatment is usually not needed. Symptoms often go away on their own. When treatment is recommended, it may involve:  Applying a cream or lotion to the bitten area. This treatment helps with itching.  Taking an antibiotic medicine. This treatment is needed if the bite area gets infected.  Getting a tetanus shot.  Applying ice to the affected area.  Medicines called antihistamines. This treatment is needed if you develop an allergic reaction to the insect bite.  Follow these instructions at home: Bite area care  Do not scratch the bite area.  Keep the bite area clean and dry. Wash it every day with soap and water as told by your health care provider.  Check the bite area every day for signs of infection. Check for: ? More redness, swelling, or pain. ? Fluid or blood. ? Warmth. ? Pus. Managing pain, itching, and swelling   You may apply a baking soda paste, cortisone cream, or calamine lotion to the bite area as told by your health care provider.  If directed, applyice to the bite area. ? Put ice in a  plastic bag. ? Place a towel between your skin and the bag. ? Leave the ice on for 20 minutes, 2-3 times per day. Medicines  Apply or take over-the-counter and prescription medicines only as told by your health care provider.  If you were prescribed an antibiotic medicine, use it as told by your health care provider. Do not stop using the antibiotic even if your condition improves. General instructions  Keep all follow-up visits as told by your health care provider. This is important. How is this prevented? To help reduce your risk of insect bites:  When you are outdoors, wear clothing that covers your arms and legs.  Use insect repellent. The best insect repellents contain: ? DEET, picaridin, oil of lemon eucalyptus (OLE), or IR3535. ? Higher amounts of an active ingredient.  If your home windows do not have screens, consider installing them.  Contact a health care provider if:  You have more redness, swelling, or pain in the bite area.  You have fluid, blood, or pus coming from the bite area.  The bite area feels warm to the touch.  You have a fever. Get help right away if:  You have joint pain.  You have a rash.  You have shortness of breath.  You feel unusually tired or sleepy.  You have neck pain.  You have a headache.  You have unusual weakness.  You have chest pain.  You have nausea, vomiting, or pain in the abdomen. This   information is not intended to replace advice given to you by your health care provider. Make sure you discuss any questions you have with your health care provider. Document Released: 04/16/2004 Document Revised: 11/06/2015 Document Reviewed: 09/16/2015 Elsevier Interactive Patient Education  2018 [employer]  

## 2016-12-02 ENCOUNTER — Other Ambulatory Visit: Payer: Self-pay | Admitting: Family

## 2016-12-29 ENCOUNTER — Ambulatory Visit (INDEPENDENT_AMBULATORY_CARE_PROVIDER_SITE_OTHER): Payer: Self-pay | Admitting: *Deleted

## 2016-12-29 DIAGNOSIS — Z23 Encounter for immunization: Secondary | ICD-10-CM

## 2017-02-27 ENCOUNTER — Other Ambulatory Visit: Payer: Self-pay | Admitting: Family

## 2017-02-27 NOTE — Telephone Encounter (Signed)
Last labs 05/24/2015

## 2017-04-02 ENCOUNTER — Other Ambulatory Visit: Payer: Self-pay | Admitting: Family

## 2017-05-05 ENCOUNTER — Other Ambulatory Visit: Payer: Self-pay | Admitting: Family

## 2017-05-05 NOTE — Telephone Encounter (Signed)
Last seen 08/27/16  Jackie Ayala

## 2017-06-02 ENCOUNTER — Other Ambulatory Visit: Payer: Self-pay | Admitting: Family

## 2017-06-02 NOTE — Telephone Encounter (Signed)
Patient NTBS for follow up and lab work. Will do 30 days refill, but no more refills until appt.

## 2017-06-02 NOTE — Telephone Encounter (Signed)
Last seen 08/27/16  Jackie Ayala

## 2017-07-05 ENCOUNTER — Ambulatory Visit (INDEPENDENT_AMBULATORY_CARE_PROVIDER_SITE_OTHER): Payer: 59 | Admitting: Family

## 2017-07-05 ENCOUNTER — Encounter: Payer: Self-pay | Admitting: Family

## 2017-07-05 VITALS — BP 121/72 | HR 52 | Temp 97.4°F | Ht 62.0 in | Wt 207.4 lb

## 2017-07-05 DIAGNOSIS — E669 Obesity, unspecified: Secondary | ICD-10-CM

## 2017-07-05 DIAGNOSIS — K219 Gastro-esophageal reflux disease without esophagitis: Secondary | ICD-10-CM

## 2017-07-05 DIAGNOSIS — Z23 Encounter for immunization: Secondary | ICD-10-CM

## 2017-07-05 DIAGNOSIS — Z1212 Encounter for screening for malignant neoplasm of rectum: Secondary | ICD-10-CM

## 2017-07-05 DIAGNOSIS — I10 Essential (primary) hypertension: Secondary | ICD-10-CM

## 2017-07-05 DIAGNOSIS — Z0000 Encounter for general adult medical examination without abnormal findings: Secondary | ICD-10-CM | POA: Diagnosis not present

## 2017-07-05 DIAGNOSIS — R001 Bradycardia, unspecified: Secondary | ICD-10-CM

## 2017-07-05 DIAGNOSIS — Z1211 Encounter for screening for malignant neoplasm of colon: Secondary | ICD-10-CM

## 2017-07-05 MED ORDER — ATENOLOL 50 MG PO TABS: 50.0000 mg | ORAL_TABLET | Freq: Every day | ORAL | 1 refills | Status: DC

## 2017-07-05 MED ORDER — OMEPRAZOLE 40 MG PO CPDR: 40.0000 mg | DELAYED_RELEASE_CAPSULE | Freq: Every day | ORAL | 3 refills | Status: DC

## 2017-07-05 NOTE — Addendum Note (Signed)
Addended by: Shelbie Ammons on: 07/05/2017 11:03 AM   Modules accepted: Orders

## 2017-07-05 NOTE — Progress Notes (Signed)
Subjective:    Patient ID: Jackie Ayala, female    DOB: 1960/05/29, 57 y.o.   MRN: [id number]  Pt presents to the office today for CPE without pap.  Hypertension  This is a chronic problem. The current episode started more than 1 year ago. The problem has been resolved since onset. The problem is controlled. Associated symptoms include malaise/fatigue. Pertinent negatives include no headaches, peripheral edema or shortness of breath. Risk factors for coronary artery disease include dyslipidemia and obesity. The current treatment provides moderate improvement. There is no history of kidney disease, CAD/MI or heart failure.  Gastroesophageal Reflux  She complains of belching and heartburn. She reports no coughing. This is a chronic problem. The current episode started more than 1 year ago. The problem occurs frequently. The problem has been waxing and waning. The symptoms are aggravated by certain foods. Risk factors include obesity. She has tried a PPI for the symptoms. The treatment provided moderate relief.  Arthritis  Presents for follow-up visit. She complains of pain. The symptoms have been worsening. Affected locations include the right knee, left knee, right ankle and left ankle. Her pain is at a severity of 5/10.      Review of Systems  Constitutional: Positive for malaise/fatigue.  Respiratory: Negative for cough and shortness of breath.   Gastrointestinal: Positive for heartburn.  Musculoskeletal: Positive for arthritis.  Neurological: Negative for headaches.  All other systems reviewed and are negative.      Objective:   Physical Exam  Constitutional: She is oriented to person, place, and time. She appears well-developed and well-nourished. No distress.  HENT:  Head: Normocephalic and atraumatic.  Right Ear: External ear normal.  Left Ear: External ear normal.  Nose: Nose normal.  Mouth/Throat: Oropharynx is clear and moist.  Eyes: Pupils are equal, round, and reactive  to light.  Neck: Normal range of motion. Neck supple. No thyromegaly present.  Cardiovascular: Regular rhythm, normal heart sounds and intact distal pulses. Bradycardia present.  No murmur heard. Pulmonary/Chest: Effort normal and breath sounds normal. No respiratory distress. She has no wheezes.  Abdominal: Soft. Bowel sounds are normal. She exhibits no distension. There is no tenderness.  Musculoskeletal: Normal range of motion. She exhibits no edema or tenderness.  Neurological: She is alert and oriented to person, place, and time.  Skin: Skin is warm and dry.  Psychiatric: She has a normal mood and affect. Her behavior is normal. Judgment and thought content normal.  Vitals reviewed.    BP 121/72   Pulse (!) 52   Temp (!) 97.4 F (36.3 C) (Oral)   Ht _0  (1.575 m)   Wt 207 lb 6.4 oz (94.1 kg)   BMI 37.93 kg/m      Assessment & Plan:  1. Annual physical exam - Anemia Profile B - CMP14+EGFR - Lipid panel - TSH - VITAMIN D 25 Hydroxy (Vit-D Deficiency, Fractures) - Hepatitis C antibody - Cologuard  2. Essential hypertension - CMP14+EGFR - atenolol (TENORMIN) 50 MG tablet; Take 1 tablet (50 mg total) by mouth daily.  Dispense: 90 tablet; Refill: 1  3. Gastroesophageal reflux disease, esophagitis presence not specified - CMP14+EGFR - omeprazole (PRILOSEC) 40 MG capsule; Take 1 capsule (40 mg total) by mouth daily.  Dispense: 90 capsule; Refill: 3  4. Obesity (BMI 35.0-39.9 without comorbidity)  - CMP14+EGFR  5. Colon cancer screening - CMP14+EGFR - Cologuard  6. Screening for malignant neoplasm of the rectum - CMP14+EGFR - Cologuard  7. Bradycardia -Pt's heart rate  has been normal last 3 visits, will hold off on adjusting medications at this time. If continues to be low will change to lisinopril.  Continue all meds Labs pending Health Maintenance reviewed-TDAP given today Diet and exercise encouraged RTO 1 year  Evelina Dun, FNP

## 2017-07-05 NOTE — Patient Instructions (Signed)

## 2017-07-06 LAB — CMP14+EGFR
ALT: 21 IU/L (ref 0–32)
AST: 24 IU/L (ref 0–40)
Albumin/Globulin Ratio: 1.2 (ref 1.2–2.2)
Albumin: 4 g/dL (ref 3.5–5.5)
Alkaline Phosphatase: 80 IU/L (ref 39–117)
BUN/Creatinine Ratio: 15 (ref 9–23)
BUN: 14 mg/dL (ref 6–24)
Bilirubin Total: 0.5 mg/dL (ref 0.0–1.2)
CO2: 24 mmol/L (ref 20–29)
Calcium: 9.4 mg/dL (ref 8.7–10.2)
Chloride: 102 mmol/L (ref 96–106)
Creatinine, Ser: 0.96 mg/dL (ref 0.57–1.00)
GFR calc Af Amer: 75 mL/min/1.73 (ref >59)
GFR calc non Af Amer: 65 mL/min/1.73 (ref >59)
Globulin, Total: 3.3 g/dL (ref 1.5–4.5)
Glucose: 100 mg/dL — ABNORMAL HIGH (ref 65–99)
Potassium: 4.4 mmol/L (ref 3.5–5.2)
Sodium: 139 mmol/L (ref 134–144)
Total Protein: 7.3 g/dL (ref 6.0–8.5)

## 2017-07-06 LAB — LIPID PANEL
Chol/HDL Ratio: 3.7 ratio (ref 0.0–4.4)
Cholesterol, Total: 183 mg/dL (ref 100–199)
HDL: 49 mg/dL (ref >39)
LDL Calculated: 98 mg/dL (ref 0–99)
Triglycerides: 178 mg/dL — ABNORMAL HIGH (ref 0–149)
VLDL Cholesterol Cal: 36 mg/dL (ref 5–40)

## 2017-07-06 LAB — VITAMIN D 25 HYDROXY (VIT D DEFICIENCY, FRACTURES): VIT D 25 HYDROXY: 31.5 ng/mL (ref 30.0–100.0)

## 2017-07-06 LAB — ANEMIA PROFILE B
BASOS ABS: 0 10*3/uL (ref 0.0–0.2)
Basos: 0 %
EOS (ABSOLUTE): 0.1 10*3/uL (ref 0.0–0.4)
EOS: 2 %
Ferritin: 172 ng/mL — ABNORMAL HIGH (ref 15–150)
HEMOGLOBIN: 13.6 g/dL (ref 11.1–15.9)
Hematocrit: 40.1 % (ref 34.0–46.6)
IMMATURE GRANULOCYTES: 0 %
IRON SATURATION: 38 % (ref 15–55)
IRON: 108 ug/dL (ref 27–159)
Immature Grans (Abs): 0 10*3/uL (ref 0.0–0.1)
LYMPHS ABS: 2 10*3/uL (ref 0.7–3.1)
Lymphs: 34 %
MCH: 31.1 pg (ref 26.6–33.0)
MCHC: 33.9 g/dL (ref 31.5–35.7)
MCV: 92 fL (ref 79–97)
MONOCYTES: 8 %
Monocytes Absolute: 0.5 10*3/uL (ref 0.1–0.9)
NEUTROS ABS: 3.2 10*3/uL (ref 1.4–7.0)
Neutrophils: 56 %
Platelets: 249 10*3/uL (ref 150–379)
RBC: 4.37 x10E6/uL (ref 3.77–5.28)
RDW: 13.7 % (ref 12.3–15.4)
Retic Ct Pct: 1.8 % (ref 0.6–2.6)
TIBC: 287 ug/dL (ref 250–450)
UIBC: 179 ug/dL (ref 131–425)
WBC: 5.8 10*3/uL (ref 3.4–10.8)

## 2017-07-06 LAB — HEPATITIS C ANTIBODY: Hep C Virus Ab: <0.1 {s_co_ratio} (ref 0.0–0.9)

## 2017-07-06 LAB — TSH: TSH: 2.25 u[IU]/mL (ref 0.450–4.500)

## 2017-10-05 ENCOUNTER — Encounter: Payer: Self-pay | Admitting: *Deleted

## 2017-11-19 ENCOUNTER — Other Ambulatory Visit: Payer: Self-pay | Admitting: Family

## 2017-11-19 DIAGNOSIS — I10 Essential (primary) hypertension: Secondary | ICD-10-CM

## 2017-11-23 NOTE — Telephone Encounter (Signed)
OV 07/05/17 RTC 51yr

## 2018-01-24 ENCOUNTER — Encounter: Payer: Self-pay | Admitting: *Deleted

## 2018-01-24 ENCOUNTER — Ambulatory Visit (INDEPENDENT_AMBULATORY_CARE_PROVIDER_SITE_OTHER): Payer: Managed Care, Other (non HMO)

## 2018-01-24 ENCOUNTER — Ambulatory Visit (INDEPENDENT_AMBULATORY_CARE_PROVIDER_SITE_OTHER): Payer: Managed Care, Other (non HMO) | Admitting: Family Medicine

## 2018-01-24 ENCOUNTER — Encounter: Payer: Self-pay | Admitting: Family Medicine

## 2018-01-24 VITALS — BP 128/69 | HR 56 | Temp 98.3°F | Ht 61.0 in | Wt 206.4 lb

## 2018-01-24 DIAGNOSIS — M25562 Pain in left knee: Secondary | ICD-10-CM

## 2018-01-24 MED ORDER — DICLOFENAC SODIUM 75 MG PO TBEC: 75.0000 mg | DELAYED_RELEASE_TABLET | Freq: Two times a day (BID) | ORAL | 2 refills | Status: DC

## 2018-01-24 NOTE — Progress Notes (Signed)
Chief Complaint  Patient presents with  . Knee Pain    back of left knee    HPI  Patient presents today for posterior left knee pain. Feels squishy behind left knee. Also has pain when she fully extends that is severe enough that she feels she will fall. She ashn't fallen, but struggled at work 2 nights last week. NKI  PMH: Smoking status noted ROS: Per HPI  Objective: BP 128/69 (BP Location: Left Arm)   Pulse (!) 56   Temp 98.3 F (36.8 C)   Ht 5\' 1"  (1.549 m)   Wt 206 lb 6.4 oz (93.6 kg)   BMI 39.00 kg/m  Gen: NAD, alert, cooperative with exam Ext: No edema, warm. There is Bakers cyst palpable at left. There is mild tenderness. Knee stable for drawer, Collateral stress, [place]. Pronounced limp with ambulation. Neuro: Alert and oriented, No gross deficits XR - STS. No acute fx or dislocation. Assessment and plan:  1. Acute pain of left knee     Meds ordered this encounter  Medications  . diclofenac (VOLTAREN) 75 MG EC tablet    Sig: Take 1 tablet (75 mg total) by mouth 2 (two) times daily. For muscle and  Joint pain    Dispense:  60 tablet    Refill:  2    Orders Placed This Encounter  Procedures  . DG Knee 1-2 Views Left    Standing Status:   Future    Number of Occurrences:   1    Standing Expiration Date:   03/26/2019    Order Specific Question:   Reason for Exam (SYMPTOM  OR DIAGNOSIS REQUIRED)    Answer:   pain posteriorly with ambulation NKI    Order Specific Question:   Is the patient pregnant?    Answer:   No    Order Specific Question:   Preferred imaging location?    Answer:   Internal   Wear brace when active for 2 weeks Follow up as needed.  Claretta Fraise, MD

## 2018-02-07 ENCOUNTER — Other Ambulatory Visit: Payer: Self-pay

## 2018-02-07 ENCOUNTER — Encounter: Payer: Self-pay | Admitting: Obstetrics and Gynecology

## 2018-02-07 ENCOUNTER — Ambulatory Visit (INDEPENDENT_AMBULATORY_CARE_PROVIDER_SITE_OTHER): Payer: 59 | Admitting: Obstetrics and Gynecology

## 2018-02-07 VITALS — BP 158/98 | HR 70 | Resp 14 | Ht 62.0 in | Wt 210.0 lb

## 2018-02-07 DIAGNOSIS — Z01419 Encounter for gynecological examination (general) (routine) without abnormal findings: Secondary | ICD-10-CM

## 2018-02-07 NOTE — Progress Notes (Signed)
57 y.o. G2P2 Married Caucasian female here for annual exam.    Hx of vaginal atrophy.  Did not fill due to cost.  Not symptomatic now.   Feels warm sometimes. No real hot flashes.  No HRT use.   Had some rectal bleeding due to hemorrhoids.   Feels like she is dragging.  Stopped her vit B12.   Caring for her husband who has Parkinson's disease and has had prostate surgery.  PCP: Georgiana Spinner, FNP    No LMP recorded. Patient has had a hysterectomy.           Sexually active: No. female The current method of family planning is status post hysterectomy for fibroids.  Exercising: No.   Smoker:  no  Health Maintenance: Pap: 2016/2017 normal History of abnormal Pap:  no MMG:  2016 normal Colonoscopy:  None.  Has a Cologuard from her PCP.  BMD:   n/a  Result  n/a TDaP:  07-05-17 Gardasil:   no HIV:no Hep C:no Screening Labs:  Hb today: PCP Did flu vaccine.    reports that she has never smoked. She has never used smokeless tobacco. She reports that she does not drink alcohol or use drugs.  Past Medical History:  Diagnosis Date  . Hypertension     Past Surgical History:  Procedure Laterality Date  . ABDOMINAL HYSTERECTOMY  2004   ovaries remain  . CHOLECYSTECTOMY      Current Outpatient Medications  Medication Sig Dispense Refill  . atenolol (TENORMIN) 50 MG tablet TAKE 1 TABLET BY MOUTH EVERY DAY 90 tablet 1  . Cholecalciferol (VITAMIN D-3 PO) Take by mouth. 1000 IUD 1-2 capsules daily    . diclofenac (VOLTAREN) 75 MG EC tablet Take 1 tablet (75 mg total) by mouth 2 (two) times daily. For muscle and  Joint pain 60 tablet 2  . Multiple Vitamin (MULTIVITAMIN) capsule Take 1 capsule by mouth daily.    . Omega-3 1000 MG CAPS Take 1 capsule by mouth daily.    Marland Kitchen omeprazole (PRILOSEC) 40 MG capsule Take 1 capsule (40 mg total) by mouth daily. 90 capsule 3  . Turmeric 500 MG CAPS Take 1 tablet by mouth daily.     No current facility-administered medications for this visit.      Family History  Problem Relation Age of Onset  . Diabetes Mother   . Breast cancer Mother 16  . COPD Mother   . Hypertension Mother   . Heart disease Father        Heart attack    Review of Systems  All other systems reviewed and are negative.   Exam:   BP (!) 158/98 (BP Location: Right Arm, Patient Position: Sitting, Cuff Size: Large)   Pulse 70   Resp 14   Ht 5\' 2"  (1.575 m)   Wt 210 lb (95.3 kg)   BMI 38.41 kg/m     General appearance: alert, cooperative and appears stated age Head: Normocephalic, without obvious abnormality, atraumatic Neck: no adenopathy, supple, symmetrical, trachea midline and thyroid normal to inspection and palpation Lungs: clear to auscultation bilaterally Breasts: normal appearance, no masses or tenderness, No nipple retraction or dimpling, No nipple discharge or bleeding, No axillary or supraclavicular adenopathy Heart: regular rate and rhythm Abdomen: soft, non-tender; no masses, no organomegaly Extremities: extremities normal, atraumatic, no cyanosis or edema.  Left knee in a brace. Skin: Skin color, texture, turgor normal. No rashes or lesions Lymph nodes: Cervical, supraclavicular, and axillary nodes normal. No abnormal inguinal nodes  palpated Neurologic: Grossly normal  Pelvic: External genitalia:  no lesions              Urethra:  normal appearing urethra with no masses, tenderness or lesions              Bartholins and Skenes: normal                 Vagina: normal appearing vagina with normal color and discharge, no lesions              Cervix:  absent              Pap taken: No. Bimanual Exam:  Uterus:   absent              Adnexa: no mass, fullness, tenderness              Rectal exam: Yes.  .  Confirms.              Anus:  normal sphincter tone, no lesions  Chaperone was present for exam.  Assessment:   Well woman visit with normal exam. Status post TAH for fibroids.  Ovaries remain.  FH breast cancer.  Hx vit B12  deficiency.   Plan: Mammogram screening.  She will schedule.  Recommended self breast awareness. Pap and HR HPV as above. Guidelines for Calcium, Vitamin D, regular exercise program including cardiovascular and weight bearing exercise. She will contact her PCP about her Cologuard. Labs with PCP.  She will follow up to see if she needs to restart her B12.  Follow up annually and prn.   After visit summary provided.

## 2018-02-07 NOTE — Patient Instructions (Signed)

## 2018-05-02 ENCOUNTER — Ambulatory Visit: Payer: 59

## 2018-06-13 ENCOUNTER — Other Ambulatory Visit: Payer: Self-pay | Admitting: Family

## 2018-06-13 DIAGNOSIS — I10 Essential (primary) hypertension: Secondary | ICD-10-CM

## 2018-07-05 ENCOUNTER — Other Ambulatory Visit: Payer: Self-pay | Admitting: Family

## 2018-07-05 DIAGNOSIS — K219 Gastro-esophageal reflux disease without esophagitis: Secondary | ICD-10-CM

## 2018-08-03 ENCOUNTER — Other Ambulatory Visit: Payer: Self-pay | Admitting: Family

## 2018-08-03 DIAGNOSIS — K219 Gastro-esophageal reflux disease without esophagitis: Secondary | ICD-10-CM

## 2018-08-03 NOTE — Telephone Encounter (Signed)
Needs appt for refills

## 2018-08-03 NOTE — Telephone Encounter (Signed)
Appointment scheduled with provider for refills.

## 2018-08-04 ENCOUNTER — Ambulatory Visit (INDEPENDENT_AMBULATORY_CARE_PROVIDER_SITE_OTHER): Payer: 59 | Admitting: Family

## 2018-08-04 ENCOUNTER — Encounter: Payer: Self-pay | Admitting: Family

## 2018-08-04 ENCOUNTER — Other Ambulatory Visit: Payer: Self-pay

## 2018-08-04 DIAGNOSIS — E669 Obesity, unspecified: Secondary | ICD-10-CM

## 2018-08-04 DIAGNOSIS — I10 Essential (primary) hypertension: Secondary | ICD-10-CM

## 2018-08-04 DIAGNOSIS — K219 Gastro-esophageal reflux disease without esophagitis: Secondary | ICD-10-CM | POA: Diagnosis not present

## 2018-08-04 DIAGNOSIS — M25562 Pain in left knee: Secondary | ICD-10-CM | POA: Diagnosis not present

## 2018-08-04 DIAGNOSIS — G8929 Other chronic pain: Secondary | ICD-10-CM

## 2018-08-04 MED ORDER — DICLOFENAC SODIUM 75 MG PO TBEC: 75.0000 mg | DELAYED_RELEASE_TABLET | Freq: Two times a day (BID) | ORAL | 2 refills | Status: DC

## 2018-08-04 MED ORDER — ATENOLOL 50 MG PO TABS: 50.0000 mg | ORAL_TABLET | Freq: Every day | ORAL | 1 refills | Status: DC

## 2018-08-04 MED ORDER — OMEPRAZOLE 40 MG PO CPDR: 40.0000 mg | DELAYED_RELEASE_CAPSULE | Freq: Every day | ORAL | 0 refills | Status: DC

## 2018-08-04 NOTE — Progress Notes (Signed)
Virtual Visit via telephone Note  I connected with Jackie Ayala on 08/04/18 at 12:51 pm by telephone and verified that I am speaking with the correct person using two identifiers. Jackie Ayala is currently located at home and grandson  is currently with her during visit. The provider, Evelina Dun, FNP is located in their office at time of visit.  I discussed the limitations, risks, security and privacy concerns of performing an evaluation and management service by telephone and the availability of in person appointments. I also discussed with the patient that there may be a patient responsible charge related to this service. The patient expressed understanding and agreed to proceed.   History and Present Illness:  Pt calls the office today for chronic follow up.  Hypertension  This is a chronic problem. The current episode started more than 1 year ago. The problem has been resolved since onset. The problem is controlled. Pertinent negatives include no headaches, malaise/fatigue, peripheral edema or shortness of breath. Risk factors for coronary artery disease include obesity and sedentary lifestyle. Past treatments include beta blockers. The current treatment provides moderate improvement. There is no history of kidney disease, CAD/MI, CVA or heart failure.  Gastroesophageal Reflux  She complains of belching and heartburn. She reports no globus sensation. This is a chronic problem. The current episode started more than 1 year ago. The problem occurs occasionally. The problem has been waxing and waning. She has tried a PPI for the symptoms. The treatment provided moderate relief.  Knee Pain   The injury mechanism was a fall. The pain is present in the left knee. The quality of the pain is described as aching. The pain is at a severity of 6/10. The pain is moderate. She has tried NSAIDs and rest for the symptoms. The treatment provided mild relief.      Review of Systems  Constitutional:  Negative for malaise/fatigue.  Respiratory: Negative for shortness of breath.   Gastrointestinal: Positive for heartburn.  Neurological: Negative for headaches.  All other systems reviewed and are negative.    Observations/Objective: No SOB or distress   Assessment and Plan: Jackie Ayala comes in today with chief complaint of No chief complaint on file.   Diagnosis and orders addressed:  1. Essential hypertension - atenolol (TENORMIN) 50 MG tablet; Take 1 tablet (50 mg total) by mouth daily.  Dispense: 90 tablet; Refill: 1  2. Gastroesophageal reflux disease, esophagitis presence not specified - omeprazole (PRILOSEC) 40 MG capsule; Take 1 capsule (40 mg total) by mouth daily. (Needs to be seen before next refill)  Dispense: 30 capsule; Refill: 0  3. Obesity (BMI 35.0-39.9 without comorbidity)  4. Chronic pain of left knee - diclofenac (VOLTAREN) 75 MG EC tablet; Take 1 tablet (75 mg total) by mouth 2 (two) times daily. For muscle and  Joint pain  Dispense: 60 tablet; Refill: 2   Labs reviewed- Has been a year. Pt will follow up in 3 months and have lab work completed Health Maintenance reviewed- Will get pap scanned into chart, she will schedule colonoscopy  Diet and exercise encouraged  Follow up plan: 3 months       I discussed the assessment and treatment plan with the patient. The patient was provided an opportunity to ask questions and all were answered. The patient agreed with the plan and demonstrated an understanding of the instructions.   The patient was advised to call back or seek an in-person evaluation if the symptoms worsen or if the condition fails  to improve as anticipated.  The above assessment and management plan was discussed with the patient. The patient verbalized understanding of and has agreed to the management plan. Patient is aware to call the clinic if symptoms persist or worsen. Patient is aware when to return to the clinic for a follow-up  visit. Patient educated on when it is appropriate to go to the emergency department.   Time call ended:  1:05 pm  I provided 14 minutes of non-face-to-face time during this encounter.    Evelina Dun, FNP

## 2018-09-01 ENCOUNTER — Other Ambulatory Visit: Payer: Self-pay | Admitting: Family

## 2018-09-01 DIAGNOSIS — K219 Gastro-esophageal reflux disease without esophagitis: Secondary | ICD-10-CM

## 2018-09-26 ENCOUNTER — Ambulatory Visit (INDEPENDENT_AMBULATORY_CARE_PROVIDER_SITE_OTHER): Payer: 59 | Admitting: Family

## 2018-09-26 ENCOUNTER — Encounter: Payer: Self-pay | Admitting: Family

## 2018-09-26 ENCOUNTER — Other Ambulatory Visit: Payer: Self-pay

## 2018-09-26 DIAGNOSIS — E669 Obesity, unspecified: Secondary | ICD-10-CM

## 2018-09-26 DIAGNOSIS — I10 Essential (primary) hypertension: Secondary | ICD-10-CM

## 2018-09-26 MED ORDER — ATENOLOL 50 MG PO TABS: 50.0000 mg | ORAL_TABLET | Freq: Every day | ORAL | 1 refills | Status: DC

## 2018-09-26 NOTE — Progress Notes (Signed)
   Virtual Visit via telephone Note  I connected with Jackie Ayala on 09/26/18 at 11:33 AM by telephone and verified that I am speaking with the correct person using two identifiers. Jackie Ayala is currently located at home and no one is currently with her during visit. The provider, Evelina Dun, FNP is located in their office at time of visit.  I discussed the limitations, risks, security and privacy concerns of performing an evaluation and management service by telephone and the availability of in person appointments. I also discussed with the patient that there may be a patient responsible charge related to this service. The patient expressed understanding and agreed to proceed.   History and Present Illness:  Pt presents to the office today to discuss her BP medication. She states she has tried getting her BP medication filled, but was told it was too soon to fill again. She had a virtual visit on 08/04/18 and we will refilled her atenolol. She is unsure if her medication bottle was thrown away, but now is out of medication. She states she is out of work and worries about the price.  Hypertension This is a chronic problem. The current episode started more than 1 year ago. The problem has been resolved since onset. The problem is controlled. Associated symptoms include malaise/fatigue. Pertinent negatives include no peripheral edema or shortness of breath. Risk factors for coronary artery disease include obesity and sedentary lifestyle. Past treatments include beta blockers. The current treatment provides moderate improvement.      Review of Systems  Constitutional: Positive for malaise/fatigue.  Respiratory: Negative for shortness of breath.      Observations/Objective: No SOB or distress noted   Assessment and Plan: 1. Essential hypertension Will refill atenolol today since she can not find her other bottle, we did send to [facility] today to use their cash price -Daily blood  pressure log given with instructions on how to fill out and told to bring to next visit -Dash diet information given -Exercise encouraged - Stress Management  -Continue current meds -Keep chronic follow up for lab work - atenolol (TENORMIN) 50 MG tablet; Take 1 tablet (50 mg total) by mouth daily.  Dispense: 90 tablet; Refill: 1  2. Obesity (BMI 35.0-39.9 without comorbidity)     I discussed the assessment and treatment plan with the patient. The patient was provided an opportunity to ask questions and all were answered. The patient agreed with the plan and demonstrated an understanding of the instructions.   The patient was advised to call back or seek an in-person evaluation if the symptoms worsen or if the condition fails to improve as anticipated.  The above assessment and management plan was discussed with the patient. The patient verbalized understanding of and has agreed to the management plan. Patient is aware to call the clinic if symptoms persist or worsen. Patient is aware when to return to the clinic for a follow-up visit. Patient educated on when it is appropriate to go to the emergency department.   Time call ended:  11:44 AM  I provided 11 minutes of non-face-to-face time during this encounter.    Evelina Dun, FNP

## 2018-12-14 ENCOUNTER — Ambulatory Visit (INDEPENDENT_AMBULATORY_CARE_PROVIDER_SITE_OTHER): Payer: Self-pay

## 2018-12-14 ENCOUNTER — Other Ambulatory Visit: Payer: Self-pay

## 2018-12-14 DIAGNOSIS — Z23 Encounter for immunization: Secondary | ICD-10-CM

## 2018-12-19 ENCOUNTER — Ambulatory Visit (INDEPENDENT_AMBULATORY_CARE_PROVIDER_SITE_OTHER): Payer: Self-pay | Admitting: Physician Assistant

## 2018-12-19 ENCOUNTER — Encounter: Payer: Self-pay | Admitting: Physician Assistant

## 2018-12-19 DIAGNOSIS — R42 Dizziness and giddiness: Secondary | ICD-10-CM

## 2018-12-19 MED ORDER — MECLIZINE HCL 25 MG PO TABS: 25.0000 mg | ORAL_TABLET | Freq: Three times a day (TID) | ORAL | 0 refills | Status: DC | PRN

## 2018-12-19 NOTE — Progress Notes (Signed)
         Telephone visit  Subjective: CC: Dizziness PCP: Sharion Balloon, FNP [id number] Jackie Ayala is a 58 y.o. female calls for telephone consult today. Patient provides verbal consent for consult held via phone.  Patient is identified with 2 separate identifiers.  At this time the entire area is on COVID-19 social distancing and stay home orders are in place.  Patient is of higher risk and therefore we are performing this by a virtual method.  Location of patient: home Location of provider: [facility] Others present for call: No  This patient has had severe vertigo for the past few days.  It started on 12/16/2018.  When she got up that morning she felt very dizzy.  In 2006 she had a similar situation but that was when she lost her hearing.  But this feels very much the same.  She had the dizziness and had walked on the walls on Saturday.  She did have some nausea but did not vomit.  She really has rested over the past few days.  She did not go and take anything like Dramamine.  So we have had a discussion that she may want to always keep a medication like Antivert at her house in case she has episodes like this again.  And she does want to get that medication sent in.  She denies any other fever or chills, no sinus pressure or drainage.  No cough wheeze, no COVID exposure.   ROS: Per HPI  Allergies  Allergen Reactions  . Penicillins   . Sulfa Antibiotics    Past Medical History:  Diagnosis Date  . Hypertension     Current Outpatient Medications:  .  atenolol (TENORMIN) 50 MG tablet, Take 1 tablet (50 mg total) by mouth daily., Disp: 90 tablet, Rfl: 1 .  Cholecalciferol (VITAMIN D-3 PO), Take by mouth. 1000 IUD 1-2 capsules daily, Disp: , Rfl:  .  diclofenac (VOLTAREN) 75 MG EC tablet, Take 1 tablet (75 mg total) by mouth 2 (two) times daily. For muscle and  Joint pain, Disp: 60 tablet, Rfl: 2 .  meclizine (ANTIVERT) 25 MG tablet, Take 1 tablet (25 mg total) by mouth 3 (three) times  daily as needed for dizziness., Disp: 60 tablet, Rfl: 0 .  Multiple Vitamin (MULTIVITAMIN) capsule, Take 1 capsule by mouth daily., Disp: , Rfl:  .  Omega-3 1000 MG CAPS, Take 1 capsule by mouth daily., Disp: , Rfl:  .  omeprazole (PRILOSEC) 40 MG capsule, TAKE 1 CAPSULE (40 MG TOTAL) BY MOUTH DAILY. (NEEDS TO BE SEEN BEFORE NEXT REFILL), Disp: 30 capsule, Rfl: 6 .  Turmeric 500 MG CAPS, Take 1 tablet by mouth daily., Disp: , Rfl:   Assessment/ Plan: 58 y.o. female   1. Dizziness Antivert 25 mg 1 up to 3 times a day as needed for severe dizziness   No follow-ups on file.  Continue all other maintenance medications as listed above.  Start time: 5:27 PM End time: 5:43 PM  Meds ordered this encounter  Medications  . meclizine (ANTIVERT) 25 MG tablet    Sig: Take 1 tablet (25 mg total) by mouth 3 (three) times daily as needed for dizziness.    Dispense:  60 tablet    Refill:  0    Order Specific Question:   Supervising Provider    Answer:   Janora Norlander [id number]    Particia Nearing PA-C [place] [phone]

## 2018-12-22 ENCOUNTER — Telehealth: Payer: Self-pay | Admitting: Family

## 2018-12-22 NOTE — Telephone Encounter (Signed)
Please schedule an appointment.

## 2018-12-23 ENCOUNTER — Encounter: Payer: Self-pay | Admitting: Family

## 2018-12-23 ENCOUNTER — Ambulatory Visit (INDEPENDENT_AMBULATORY_CARE_PROVIDER_SITE_OTHER): Payer: Self-pay | Admitting: Family

## 2018-12-23 ENCOUNTER — Other Ambulatory Visit: Payer: Self-pay

## 2018-12-23 VITALS — BP 137/79 | HR 60 | Temp 98.9°F | Ht 62.0 in | Wt 211.4 lb

## 2018-12-23 DIAGNOSIS — I10 Essential (primary) hypertension: Secondary | ICD-10-CM

## 2018-12-23 DIAGNOSIS — E669 Obesity, unspecified: Secondary | ICD-10-CM

## 2018-12-23 DIAGNOSIS — J301 Allergic rhinitis due to pollen: Secondary | ICD-10-CM

## 2018-12-23 DIAGNOSIS — R42 Dizziness and giddiness: Secondary | ICD-10-CM

## 2018-12-23 MED ORDER — FLUTICASONE PROPIONATE 50 MCG/ACT NA SUSP: 2.0000 | Freq: Every day | NASAL | 6 refills | Status: DC

## 2018-12-23 MED ORDER — CETIRIZINE HCL 10 MG PO TABS: 10.0000 mg | ORAL_TABLET | Freq: Every day | ORAL | 11 refills | Status: DC

## 2018-12-23 NOTE — Patient Instructions (Signed)
How to Perform the Epley Maneuver The Epley maneuver is an exercise that relieves symptoms of vertigo. Vertigo is the feeling that you or your surroundings are moving when they are not. When you feel vertigo, you may feel like the room is spinning and have trouble walking. Dizziness is a little different than vertigo. When you are dizzy, you may feel unsteady or light-headed. You can do this maneuver at home whenever you have symptoms of vertigo. You can do it up to 3 times a day until your symptoms go away. Even though the Epley maneuver may relieve your vertigo for a few weeks, it is possible that your symptoms will return. This maneuver relieves vertigo, but it does not relieve dizziness. What are the risks? If it is done correctly, the Epley maneuver is considered safe. Sometimes it can lead to dizziness or nausea that goes away after a short time. If you develop other symptoms, such as changes in vision, weakness, or numbness, stop doing the maneuver and call your health care provider. How to perform the Epley maneuver 1. Sit on the edge of a bed or table with your back straight and your legs extended or hanging over the edge of the bed or table. 2. Turn your head halfway toward the affected ear or side. 3. Lie backward quickly with your head turned until you are lying flat on your back. You may want to position a pillow under your shoulders. 4. Hold this position for 30 seconds. You may experience an attack of vertigo. This is normal. 5. Turn your head to the opposite direction until your unaffected ear is facing the floor. 6. Hold this position for 30 seconds. You may experience an attack of vertigo. This is normal. Hold this position until the vertigo stops. 7. Turn your whole body to the same side as your head. Hold for another 30 seconds. 8. Sit back up. You can repeat this exercise up to 3 times a day. Follow these instructions at home:  After doing the Epley maneuver, you can return to  your normal activities.  Ask your health care provider if there is anything you should do at home to prevent vertigo. He or she may recommend that you: ? Keep your head raised (elevated) with two or more pillows while you sleep. ? Do not sleep on the side of your affected ear. ? Get up slowly from bed. ? Avoid sudden movements during the day. ? Avoid extreme head movement, like looking up or bending over. Contact a health care provider if:  Your vertigo gets worse.  You have other symptoms, including: ? Nausea. ? Vomiting. ? Headache. Get help right away if:  You have vision changes.  You have a severe or worsening headache or neck pain.  You cannot stop vomiting.  You have new numbness or weakness in any part of your body. Summary  Vertigo is the feeling that you or your surroundings are moving when they are not.  The Epley maneuver is an exercise that relieves symptoms of vertigo.  If the Epley maneuver is done correctly, it is considered safe. You can do it up to 3 times a day. This information is not intended to replace advice given to you by your health care provider. Make sure you discuss any questions you have with your health care provider. Document Released: 03/14/2013 Document Revised: 02/19/2017 Document Reviewed: 01/28/2016 Elsevier Patient Education  2020 [employer] Dizziness Dizziness is a common problem. It is a feeling of unsteadiness   or light-headedness. You may feel like you are about to faint. Dizziness can lead to injury if you stumble or fall. Anyone can become dizzy, but dizziness is more common in older adults. This condition can be caused by a number of things, including medicines, dehydration, or illness. Follow these instructions at home: Eating and drinking  Drink enough fluid to keep your urine clear or pale yellow. This helps to keep you from becoming dehydrated. Try to drink more clear fluids, such as water.  Do not drink alcohol.  Limit  your caffeine intake if told to do so by your health care provider. Check ingredients and nutrition facts to see if a food or beverage contains caffeine.  Limit your salt (sodium) intake if told to do so by your health care provider. Check ingredients and nutrition facts to see if a food or beverage contains sodium. Activity  Avoid making quick movements. ? Rise slowly from chairs and steady yourself until you feel okay. ? In the morning, first sit up on the side of the bed. When you feel okay, stand slowly while you hold onto something until you know that your balance is fine.  If you need to stand in one place for a long time, move your legs often. Tighten and relax the muscles in your legs while you are standing.  Do not drive or use heavy machinery if you feel dizzy.  Avoid bending down if you feel dizzy. Place items in your home so that they are easy for you to reach without leaning over. Lifestyle  Do not use any products that contain nicotine or tobacco, such as cigarettes and e-cigarettes. If you need help quitting, ask your health care provider.  Try to reduce your stress level by using methods such as yoga or meditation. Talk with your health care provider if you need help to manage your stress. General instructions  Watch your dizziness for any changes.  Take over-the-counter and prescription medicines only as told by your health care provider. Talk with your health care provider if you think that your dizziness is caused by a medicine that you are taking.  Tell a friend or a family member that you are feeling dizzy. If he or she notices any changes in your behavior, have this person call your health care provider.  Keep all follow-up visits as told by your health care provider. This is important. Contact a health care provider if:  Your dizziness does not go away.  Your dizziness or light-headedness gets worse.  You feel nauseous.  You have reduced hearing.  You have  new symptoms.  You are unsteady on your feet or you feel like the room is spinning. Get help right away if:  You vomit or have diarrhea and are unable to eat or drink anything.  You have problems talking, walking, swallowing, or using your arms, hands, or legs.  You feel generally weak.  You are not thinking clearly or you have trouble forming sentences. It may take a friend or family member to notice this.  You have chest pain, abdominal pain, shortness of breath, or sweating.  Your vision changes.  You have any bleeding.  You have a severe headache.  You have neck pain or a stiff neck.  You have a fever. These symptoms may represent a serious problem that is an emergency. Do not wait to see if the symptoms will go away. Get medical help right away. Call your local emergency services (911 in the [place]   Do not drive yourself to the hospital. Summary  Dizziness is a feeling of unsteadiness or light-headedness. This condition can be caused by a number of things, including medicines, dehydration, or illness.  Anyone can become dizzy, but dizziness is more common in older adults.  Drink enough fluid to keep your urine clear or pale yellow. Do not drink alcohol.  Avoid making quick movements if you feel dizzy. Monitor your dizziness for any changes. This information is not intended to replace advice given to you by your health care provider. Make sure you discuss any questions you have with your health care provider. Document Released: 09/02/2000 Document Revised: 03/12/2017 Document Reviewed: 04/11/2016 Elsevier Patient Education  2020 [employer] Inc[employer]  

## 2018-12-23 NOTE — Telephone Encounter (Signed)
Apt scheduled.  

## 2018-12-23 NOTE — Progress Notes (Signed)
Subjective:    Patient ID: Jackie Ayala, female    DOB: [birth year], 58 y.o.   MRN: [id number]  Chief Complaint  Patient presents with  . Dizziness    Dizziness This is a recurrent problem. The current episode started in the past 7 days. The problem occurs intermittently. The problem has been gradually improving. Associated symptoms include headaches and vertigo. Pertinent negatives include no chest pain, chills, congestion, coughing, fatigue, fever, nausea or vomiting. Associated symptoms comments: Sinus pressure . The symptoms are aggravated by bending and twisting. She has tried rest and NSAIDs for the symptoms. The treatment provided mild relief.  Hypertension This is a chronic problem. The current episode started more than 1 year ago. The problem has been resolved since onset. The problem is controlled. Associated symptoms include headaches. Pertinent negatives include no chest pain, malaise/fatigue, peripheral edema or shortness of breath. The current treatment provides moderate improvement.      Review of Systems  Constitutional: Negative for chills, fatigue, fever and malaise/fatigue.  HENT: Negative for congestion.   Respiratory: Negative for cough and shortness of breath.   Cardiovascular: Negative for chest pain.  Gastrointestinal: Negative for nausea and vomiting.  Neurological: Positive for dizziness, vertigo and headaches.  All other systems reviewed and are negative.      Objective:   Physical Exam Vitals signs reviewed.  Constitutional:      General: She is not in acute distress.    Appearance: She is well-developed. She is obese.  HENT:     Head: Normocephalic and atraumatic.     Right Ear: Tympanic membrane normal.     Left Ear: Tympanic membrane normal.     Nose:     Right Turbinates: Swollen.     Left Turbinates: Swollen.     Right Sinus: No maxillary sinus tenderness or frontal sinus tenderness.     Left Sinus: No maxillary sinus tenderness or frontal  sinus tenderness.     Mouth/Throat:     Pharynx: Posterior oropharyngeal erythema present.  Eyes:     Pupils: Pupils are equal, round, and reactive to light.  Neck:     Musculoskeletal: Normal range of motion and neck supple.     Thyroid: No thyromegaly.  Cardiovascular:     Rate and Rhythm: Normal rate and regular rhythm.     Heart sounds: Normal heart sounds. No murmur.  Pulmonary:     Effort: Pulmonary effort is normal. No respiratory distress.     Breath sounds: Normal breath sounds. No wheezing.  Abdominal:     General: Bowel sounds are normal. There is no distension.     Palpations: Abdomen is soft.     Tenderness: There is no abdominal tenderness.  Musculoskeletal: Normal range of motion.        General: No tenderness.  Skin:    General: Skin is warm and dry.  Neurological:     Mental Status: She is alert and oriented to person, place, and time.     Cranial Nerves: No cranial nerve deficit.     Deep Tendon Reflexes: Reflexes are normal and symmetric.  Psychiatric:        Behavior: Behavior normal.        Thought Content: Thought content normal.        Judgment: Judgment normal.       BP 137/79   Pulse 60   Temp 98.9 F (37.2 C) (Temporal)   Ht 5\' 2"  (1.575 m)   Wt 211 lb 6.4 oz (  95.9 kg)   SpO2 99%   BMI 38.67 kg/m      Assessment & Plan:  [name] comes in today with chief complaint of Dizziness   Diagnosis and orders addressed:  1. Essential hypertension  2. Obesity (BMI 35.0-39.9 without comorbidity)  3. Allergic rhinitis due to pollen, unspecified seasonality - cetirizine (ZYRTEC) 10 MG tablet; Take 1 tablet (10 mg total) by mouth daily.  Dispense: 30 tablet; Refill: 11 - fluticasone (FLONASE) 50 MCG/ACT nasal spray; Place 2 sprays into both nostrils daily.  Dispense: 16 g; Refill: 6  4. Vertigo   Pt will start Zyrtec and flonase daily Epley exercises discussed- Handout given Fall preventions discussed Avoid fast position changes  Call if symptoms worsen or do not improve. If it does not improve, will do referral to ENT  Evelina Dun, FNP

## 2018-12-30 ENCOUNTER — Telehealth: Payer: Self-pay | Admitting: Family

## 2018-12-30 DIAGNOSIS — R7309 Other abnormal glucose: Secondary | ICD-10-CM

## 2018-12-30 MED ORDER — DOXYCYCLINE HYCLATE 100 MG PO TABS: 100.0000 mg | ORAL_TABLET | Freq: Two times a day (BID) | ORAL | 0 refills | Status: DC

## 2018-12-30 NOTE — Telephone Encounter (Signed)
Pt aware.

## 2018-12-30 NOTE — Telephone Encounter (Signed)
Doxycyline Prescription sent to pharmacy   

## 2018-12-30 NOTE — Telephone Encounter (Signed)
Patient just seen you on the second. She is back dizzy again and started back last night should she try an abx? Please advise.

## 2019-01-02 NOTE — Telephone Encounter (Signed)
Labs placed.

## 2019-01-02 NOTE — Telephone Encounter (Signed)
Patient wants lab orders placed so she can come in to have blood sugar checked.  Had an appointment 12/23/2018.

## 2019-01-02 NOTE — Telephone Encounter (Signed)
Patient notified and verbalized understanding. 

## 2019-01-02 NOTE — Telephone Encounter (Signed)
I have already placed these orders. She can come in and have them drawn.

## 2019-01-03 ENCOUNTER — Other Ambulatory Visit: Payer: Self-pay

## 2019-01-03 DIAGNOSIS — R7309 Other abnormal glucose: Secondary | ICD-10-CM

## 2019-01-03 LAB — BAYER DCA HB A1C WAIVED: HB A1C (BAYER DCA - WAIVED): 6 % (ref <7.0)

## 2019-01-03 NOTE — Addendum Note (Signed)
Addended by: Earlene Plater on: 01/03/2019 03:56 PM   Modules accepted: Orders

## 2019-01-05 ENCOUNTER — Other Ambulatory Visit: Payer: Self-pay | Admitting: Family

## 2019-01-05 DIAGNOSIS — E119 Type 2 diabetes mellitus without complications: Secondary | ICD-10-CM

## 2019-01-05 LAB — BMP8+EGFR
BUN/Creatinine Ratio: 17 (ref 12–28)
BUN: 17 mg/dL (ref 8–27)
CO2: 22 mmol/L (ref 20–29)
Calcium: 9.8 mg/dL (ref 8.7–10.3)
Chloride: 102 mmol/L (ref 96–106)
Creatinine, Ser: 1.03 mg/dL — ABNORMAL HIGH (ref 0.57–1.00)
GFR calc Af Amer: 68 mL/min/{1.73_m2} (ref >59)
GFR calc non Af Amer: 59 mL/min/{1.73_m2} — ABNORMAL LOW (ref >59)
Glucose: 95 mg/dL (ref 65–99)
Potassium: 4.1 mmol/L (ref 3.5–5.2)
Sodium: 141 mmol/L (ref 134–144)

## 2019-01-05 LAB — SPECIMEN STATUS REPORT

## 2019-01-11 ENCOUNTER — Telehealth: Payer: Self-pay | Admitting: Family

## 2019-01-11 DIAGNOSIS — R42 Dizziness and giddiness: Secondary | ICD-10-CM

## 2019-01-11 NOTE — Telephone Encounter (Signed)
REFERRAL REQUEST Telephone Note  What type of referral do you need? ENT  Have you been seen at our office for this problem? Yes 12/23/2018 for dizzy. She has finished all medications now wants to go forward with the referral (Advise that they will likely need an appointment with their PCP before a referral can be done)  Is there a particular doctor or location that you prefer? [place]  Patient notified that referrals can take up to a week or longer to process. If they haven't heard anything within a week they should call back and speak with the referral department.

## 2019-01-13 NOTE — Telephone Encounter (Signed)
What type of referral do you need? ENT  Have you been seen at our office for this problem? Yes 12/23/2018 for dizzy. She has finished all medications now wants to go forward with the referral (Advise that they will likely need an appointment with their PCP before a referral can be done)  Is there a particular doctor or location that you prefer? [facility]  Patient notified that referrals can take up to a week or longer to process. If they haven't heard anything within a week they should call back and speak with the referral department.

## 2019-01-13 NOTE — Telephone Encounter (Signed)
Referral placed.

## 2019-02-01 ENCOUNTER — Other Ambulatory Visit: Payer: Self-pay | Admitting: Physician Assistant

## 2019-02-02 ENCOUNTER — Ambulatory Visit (INDEPENDENT_AMBULATORY_CARE_PROVIDER_SITE_OTHER): Payer: Self-pay | Admitting: Otolaryngology

## 2019-02-09 ENCOUNTER — Other Ambulatory Visit: Payer: Self-pay

## 2019-02-09 ENCOUNTER — Ambulatory Visit (INDEPENDENT_AMBULATORY_CARE_PROVIDER_SITE_OTHER): Payer: Self-pay | Admitting: Otolaryngology

## 2019-02-15 ENCOUNTER — Ambulatory Visit: Payer: Self-pay | Admitting: Nutrition

## 2019-02-20 ENCOUNTER — Ambulatory Visit: Payer: 59 | Admitting: Obstetrics and Gynecology

## 2019-03-10 ENCOUNTER — Other Ambulatory Visit: Payer: Self-pay | Admitting: Family

## 2019-03-10 DIAGNOSIS — K219 Gastro-esophageal reflux disease without esophagitis: Secondary | ICD-10-CM

## 2019-03-29 ENCOUNTER — Encounter: Payer: Self-pay | Admitting: Nutrition

## 2019-03-29 ENCOUNTER — Other Ambulatory Visit: Payer: Self-pay

## 2019-03-29 ENCOUNTER — Encounter: Payer: Self-pay | Attending: Family | Admitting: Nutrition

## 2019-03-29 VITALS — Ht 62.0 in | Wt 215.0 lb

## 2019-03-29 DIAGNOSIS — I10 Essential (primary) hypertension: Secondary | ICD-10-CM | POA: Insufficient documentation

## 2019-03-29 DIAGNOSIS — E669 Obesity, unspecified: Secondary | ICD-10-CM | POA: Insufficient documentation

## 2019-03-29 DIAGNOSIS — R7303 Prediabetes: Secondary | ICD-10-CM | POA: Insufficient documentation

## 2019-03-29 NOTE — Patient Instructions (Signed)
Goals Follow MY Plate  Eat meals on time Drink 6 bottles of water per day Cut down on portions. Exercise 30-60 minutes 4-5 times per week Increase fresh fruits and vegetables. Lose 1-2 lbs per week

## 2019-03-29 NOTE — Progress Notes (Signed)
  Medical Nutrition Therapy:  Appt start time: 1300 end time:  1400.   Assessment:  Primary concerns today: Pre Dm and Obesity and HTN Currently laid off for 10 months.  Her husband has parkinsons and is in a wheelchair. She was working 3rd shift. Has gained 15 lbs since being laid off. Admits to overeating at certain. She notes she has GERD a lot and takes meds for it whenever she thinks she needs it.  Lab Results  Component Value Date   HGBA1C 6.0 01/03/2019   CMP Latest Ref Rng & Units 01/03/2019 07/05/2017 05/24/2015  Glucose 65 - 99 mg/dL 95 100(H) 92  BUN 8 - 27 mg/dL 17 14 12   Creatinine 0.57 - 1.00 mg/dL 1.03(H) 0.96 0.86  Sodium 134 - 144 mmol/L 141 139 140  Potassium 3.5 - 5.2 mmol/L 4.1 4.4 4.6  Chloride 96 - 106 mmol/L 102 102 99  CO2 20 - 29 mmol/L 22 24 25   Calcium 8.7 - 10.3 mg/dL 9.8 9.4 9.6  Total Protein 6.0 - 8.5 g/dL - 7.3 7.7  Total Bilirubin 0.0 - 1.2 mg/dL - 0.5 0.4  Alkaline Phos 39 - 117 IU/L - 80 75  AST 0 - 40 IU/L - 24 18  ALT 0 - 32 IU/L - 21 21    Preferred Learning Style:   No preference indicated   Learning Readiness:   Ready  Change in progress   MEDICATIONS:   DIETARY INTAKE:  24-hr recall:  B ( AM): 1 cup- 1 1/2c Banana, blueberries and strawberries, Donut Snk ( AM):   L ( PM):  Water , few sips of pepsi Snk ( PM):  D ( PM): 3 slices of pizza, canadian and pineapple, milk,  water Snk ( PM): Beverages: milk, little water  Usual physical activity:   Estimated energy needs: 1200 calories 135 g carbohydrates 90 g protein 30 g fat  Progress Towards Goal(s):  In progress.   Nutritional Diagnosis:  NB-1.1 Food and nutrition-related knowledge deficit As related to Diabetes Type 2.  As evidenced by A1C .    Intervention:  Nutrition and Diabetes education provided on My Plate, CHO counting, meal planning, portion sizes, timing of meals, avoiding snacks between meals unless having a low blood sugar, target ranges for A1C and blood  sugars, signs/symptoms and treatment of hyper/hypoglycemia, monitoring blood sugars, taking medications as prescribed, benefits of exercising 30 minutes per day and prevention of complications of DM. Healthy Weight loss tips, low sodium diet.  Goals Follow MY Plate  Eat meals on time Drink 6 bottles of water per day Cut down on portions. Exercise 30-60 minutes 4-5 times per week Increase fresh fruits and vegetables. Lose 1-2 lbs per week   Teaching Method Utilized:  Visual Auditory Hands on  Handouts given during visit include:  The Plate Method   Meal Plan Card  Diabetes Instructions   Barriers to learning/adherence to lifestyle change: none  Demonstrated degree of understanding via:  Teach Back   Monitoring/Evaluation:  Dietary intake, exercise, , and body weight in 1 month(s).

## 2019-03-30 ENCOUNTER — Encounter: Payer: Self-pay | Admitting: Nutrition

## 2019-04-12 ENCOUNTER — Ambulatory Visit: Payer: Self-pay | Admitting: Obstetrics and Gynecology

## 2019-04-14 ENCOUNTER — Other Ambulatory Visit: Payer: Self-pay | Admitting: Family

## 2019-04-14 DIAGNOSIS — I10 Essential (primary) hypertension: Secondary | ICD-10-CM

## 2019-05-10 ENCOUNTER — Ambulatory Visit: Payer: Self-pay | Admitting: Nutrition

## 2019-07-09 ENCOUNTER — Other Ambulatory Visit: Payer: Self-pay | Admitting: Family

## 2019-07-09 DIAGNOSIS — I10 Essential (primary) hypertension: Secondary | ICD-10-CM

## 2019-08-02 ENCOUNTER — Other Ambulatory Visit: Payer: Self-pay | Admitting: Family

## 2019-08-02 DIAGNOSIS — I10 Essential (primary) hypertension: Secondary | ICD-10-CM

## 2019-08-02 NOTE — Telephone Encounter (Signed)
[place]. NTBS 30 days given 07/10/19

## 2019-08-03 ENCOUNTER — Ambulatory Visit (INDEPENDENT_AMBULATORY_CARE_PROVIDER_SITE_OTHER): Payer: Self-pay | Admitting: Family

## 2019-08-03 ENCOUNTER — Encounter: Payer: Self-pay | Admitting: Family

## 2019-08-03 DIAGNOSIS — I10 Essential (primary) hypertension: Secondary | ICD-10-CM

## 2019-08-03 DIAGNOSIS — E1169 Type 2 diabetes mellitus with other specified complication: Secondary | ICD-10-CM

## 2019-08-03 DIAGNOSIS — K219 Gastro-esophageal reflux disease without esophagitis: Secondary | ICD-10-CM

## 2019-08-03 DIAGNOSIS — E669 Obesity, unspecified: Secondary | ICD-10-CM

## 2019-08-03 DIAGNOSIS — E785 Hyperlipidemia, unspecified: Secondary | ICD-10-CM

## 2019-08-03 MED ORDER — ATENOLOL 50 MG PO TABS: ORAL_TABLET | ORAL | 3 refills | Status: DC

## 2019-08-03 MED ORDER — OMEPRAZOLE 40 MG PO CPDR: 40.0000 mg | DELAYED_RELEASE_CAPSULE | Freq: Every day | ORAL | 3 refills | Status: DC

## 2019-08-03 NOTE — Progress Notes (Signed)
Virtual Visit via telephone Note Due to COVID-19 pandemic this visit was conducted virtually. This visit type was conducted due to national recommendations for restrictions regarding the COVID-19 Pandemic (e.g. social distancing, sheltering in place) in an effort to limit this patient's exposure and mitigate transmission in our community. All issues noted in this document were discussed and addressed.  A physical exam was not performed with this format.  I connected with Jackie Ayala on 08/03/19 at 1:53 pm  by telephone and verified that I am speaking with the correct person using two identifiers. Jackie Ayala is currently located at home and husband   is currently with her during visit. The provider, Evelina Dun, FNP is located in their office at time of visit.  I discussed the limitations, risks, security and privacy concerns of performing an evaluation and management service by telephone and the availability of in person appointments. I also discussed with the patient that there may be a patient responsible charge related to this service. The patient expressed understanding and agreed to proceed.   History and Present Illness:  Hypertension This is a chronic problem. The current episode started more than 1 year ago. The problem has been waxing and waning since onset. The problem is uncontrolled. Pertinent negatives include no peripheral edema or shortness of breath. Risk factors for coronary artery disease include dyslipidemia, obesity and sedentary lifestyle. Past treatments include beta blockers. The current treatment provides moderate improvement. There is no history of CAD/MI, CVA or heart failure.  Gastroesophageal Reflux She complains of belching and heartburn. This is a chronic problem. The current episode started more than 1 year ago. The problem occurs occasionally. Risk factors include obesity. She has tried a PPI for the symptoms. The treatment provided moderate relief.   Diabetes She presents for her follow-up diabetic visit. She has type 2 diabetes mellitus. Her disease course has been stable. There are no hypoglycemic associated symptoms. There are no diabetic associated symptoms. There are no hypoglycemic complications. Pertinent negatives for hypoglycemia complications include no blackouts. Symptoms are stable. Pertinent negatives for diabetic complications include no CVA. Risk factors for coronary artery disease include dyslipidemia, hypertension, diabetes mellitus, post-menopausal and sedentary lifestyle. She is following a generally unhealthy diet. (Does not check BS at home )      Review of Systems  Respiratory: Negative for shortness of breath.   Gastrointestinal: Positive for heartburn.  All other systems reviewed and are negative.    Observations/Objective: No SOB or distress noted   Assessment and Plan: 1. Essential hypertension PT will monitor BP at home, if >140/90 will let me know - CBC with Differential/Platelet; Future - CMP14+EGFR; Future - atenolol (TENORMIN) 50 MG tablet; One tab by mouth every day. Needs appointment for future refills.  Dispense: 90 tablet; Refill: 3  2. Gastroesophageal reflux disease, unspecified whether esophagitis present - CBC with Differential/Platelet; Future - CMP14+EGFR; Future  3. Obesity (BMI 35.0-39.9 without comorbidity) - CBC with Differential/Platelet; Future - CMP14+EGFR; Future  4. Type 2 diabetes mellitus with other specified complication, without long-term current use of insulin (HCC) - Bayer DCA Hb A1c Waived; Future - CBC with Differential/Platelet; Future - CMP14+EGFR; Future - Microalbumin / creatinine urine ratio; Future  5. Gastroesophageal reflux disease - CBC with Differential/Platelet; Future - CMP14+EGFR; Future - omeprazole (PRILOSEC) 40 MG capsule; Take 1 capsule (40 mg total) by mouth daily. (Needs to be seen before next refill)  Dispense: 90 capsule; Refill: 3  6.  Hyperlipidemia associated with type 2 diabetes mellitus (  [place]) - CBC with Differential/Platelet; Future - CMP14+EGFR; Future - Lipid panel; Future   Follow Up Instructions: 6 months     I discussed the assessment and treatment plan with the patient. The patient was provided an opportunity to ask questions and all were answered. The patient agreed with the plan and demonstrated an understanding of the instructions.   The patient was advised to call back or seek an in-person evaluation if the symptoms worsen or if the condition fails to improve as anticipated.  The above assessment and management plan was discussed with the patient. The patient verbalized understanding of and has agreed to the management plan. Patient is aware to call the clinic if symptoms persist or worsen. Patient is aware when to return to the clinic for a follow-up visit. Patient educated on when it is appropriate to go to the emergency department.   Time call ended:  2:13 pm  I provided 20 minutes of non-face-to-face time during this encounter.    Evelina Dun, FNP

## 2019-08-03 NOTE — Telephone Encounter (Signed)
Left detailed message to make an appointment.

## 2019-09-06 ENCOUNTER — Other Ambulatory Visit: Payer: Self-pay | Admitting: Family

## 2019-09-06 DIAGNOSIS — Z1231 Encounter for screening mammogram for malignant neoplasm of breast: Secondary | ICD-10-CM

## 2019-09-11 ENCOUNTER — Other Ambulatory Visit: Payer: Self-pay

## 2019-09-11 ENCOUNTER — Emergency Department (HOSPITAL_COMMUNITY)
Admission: EM | Admit: 2019-09-11 | Discharge: 2019-09-11 | Disposition: A | Discharge status: Home / Self Care | Payer: Self-pay | Attending: Emergency Medicine | Admitting: Emergency Medicine

## 2019-09-11 ENCOUNTER — Encounter (HOSPITAL_COMMUNITY): Payer: Self-pay | Admitting: Emergency Medicine

## 2019-09-11 DIAGNOSIS — W268XXA Contact with other sharp object(s), not elsewhere classified, initial encounter: Secondary | ICD-10-CM | POA: Insufficient documentation

## 2019-09-11 DIAGNOSIS — Z5321 Procedure and treatment not carried out due to patient leaving prior to being seen by health care provider: Secondary | ICD-10-CM | POA: Insufficient documentation

## 2019-09-11 DIAGNOSIS — Y929 Unspecified place or not applicable: Secondary | ICD-10-CM | POA: Insufficient documentation

## 2019-09-11 DIAGNOSIS — S81811A Laceration without foreign body, right lower leg, initial encounter: Secondary | ICD-10-CM | POA: Insufficient documentation

## 2019-09-11 DIAGNOSIS — Y999 Unspecified external cause status: Secondary | ICD-10-CM | POA: Insufficient documentation

## 2019-09-11 DIAGNOSIS — Y939 Activity, unspecified: Secondary | ICD-10-CM | POA: Insufficient documentation

## 2019-09-11 NOTE — ED Triage Notes (Signed)
Patient has a laceration to her right leg. Patient cut her leg on a metal spike that is used to secure her outside swing. Bleeding controlled at this time. Patient's last tetanus shot was April 15 th 2019.

## 2019-09-20 ENCOUNTER — Other Ambulatory Visit: Payer: Self-pay

## 2019-09-20 ENCOUNTER — Ambulatory Visit
Admission: RE | Admit: 2019-09-20 | Discharge: 2019-09-20 | Disposition: A | Discharge status: Home / Self Care | Payer: 59 | Source: Ambulatory Visit | Attending: Family | Admitting: Family

## 2019-09-20 DIAGNOSIS — Z1231 Encounter for screening mammogram for malignant neoplasm of breast: Secondary | ICD-10-CM

## 2020-05-13 ENCOUNTER — Encounter: Payer: Self-pay | Admitting: Family

## 2020-05-13 ENCOUNTER — Other Ambulatory Visit: Payer: Self-pay

## 2020-05-13 ENCOUNTER — Ambulatory Visit (INDEPENDENT_AMBULATORY_CARE_PROVIDER_SITE_OTHER): Payer: 59 | Admitting: Family

## 2020-05-13 VITALS — BP 169/80 | HR 57 | Temp 98.3°F | Ht 62.0 in | Wt 204.0 lb

## 2020-05-13 DIAGNOSIS — K219 Gastro-esophageal reflux disease without esophagitis: Secondary | ICD-10-CM

## 2020-05-13 DIAGNOSIS — I10 Essential (primary) hypertension: Secondary | ICD-10-CM | POA: Diagnosis not present

## 2020-05-13 DIAGNOSIS — Z1211 Encounter for screening for malignant neoplasm of colon: Secondary | ICD-10-CM

## 2020-05-13 DIAGNOSIS — E669 Obesity, unspecified: Secondary | ICD-10-CM

## 2020-05-13 DIAGNOSIS — R7303 Prediabetes: Secondary | ICD-10-CM | POA: Diagnosis not present

## 2020-05-13 DIAGNOSIS — E119 Type 2 diabetes mellitus without complications: Secondary | ICD-10-CM | POA: Insufficient documentation

## 2020-05-13 DIAGNOSIS — S86911A Strain of unspecified muscle(s) and tendon(s) at lower leg level, right leg, initial encounter: Secondary | ICD-10-CM

## 2020-05-13 LAB — BAYER DCA HB A1C WAIVED: HB A1C (BAYER DCA - WAIVED): 5.7 % (ref <7.0)

## 2020-05-13 MED ORDER — LISINOPRIL 20 MG PO TABS: 20.0000 mg | ORAL_TABLET | Freq: Every day | ORAL | 3 refills | Status: DC

## 2020-05-13 MED ORDER — ATENOLOL 50 MG PO TABS: ORAL_TABLET | ORAL | 3 refills | Status: DC

## 2020-05-13 MED ORDER — DICLOFENAC SODIUM 75 MG PO TBEC: 75.0000 mg | DELAYED_RELEASE_TABLET | Freq: Two times a day (BID) | ORAL | 1 refills | Status: DC

## 2020-05-13 MED ORDER — OMEPRAZOLE 40 MG PO CPDR: 40.0000 mg | DELAYED_RELEASE_CAPSULE | Freq: Every day | ORAL | 3 refills | Status: DC

## 2020-05-13 NOTE — Progress Notes (Signed)
Subjective:    Patient ID: Jackie Ayala, female    DOB: 1960/09/17, 60 y.o.   MRN: [id number]  Chief Complaint  Patient presents with  . Leg Pain    Patient states they feel weak, numbing burning and weakness in them.with cramping.   Pt presents to the office today for chronic follow up and complaining of bilateral leg weakness and numbness.  Leg Pain  Associated symptoms include numbness and tingling.  Hypertension This is a chronic problem. The current episode started more than 1 year ago. The problem has been waxing and waning since onset. The problem is uncontrolled. Associated symptoms include malaise/fatigue. Pertinent negatives include no blurred vision, peripheral edema or shortness of breath. Risk factors for coronary artery disease include dyslipidemia and obesity. Past treatments include beta blockers. The current treatment provides mild improvement. There is no history of CVA or heart failure.  Gastroesophageal Reflux She complains of belching and heartburn. This is a chronic problem. The current episode started more than 1 year ago. The problem occurs occasionally. She has tried a PPI for the symptoms. The treatment provided moderate relief.  Diabetes She presents for her follow-up diabetic visit. She has type 2 diabetes mellitus. Her disease course has been stable. There are no hypoglycemic associated symptoms. Associated symptoms include foot paresthesias. Pertinent negatives for diabetes include no blurred vision. There are no hypoglycemic complications. Symptoms are stable. Pertinent negatives for diabetic complications include no CVA. Risk factors for coronary artery disease include dyslipidemia, diabetes mellitus, hypertension, sedentary lifestyle and post-menopausal. (Does not check BS at home ) An ACE inhibitor/angiotensin II receptor blocker is not being taken. Eye exam is current.  Knee Pain  There was no injury mechanism. The pain is present in the right knee. The  quality of the pain is described as aching. The pain is at a severity of 3/10 (when she twists it, it can be a 8 out 10. ). The pain is moderate. Associated symptoms include numbness and tingling. She reports no foreign bodies present. The symptoms are aggravated by movement. She has tried non-weight bearing and rest for the symptoms. The treatment provided mild relief.      Review of Systems  Constitutional: Positive for malaise/fatigue.  Eyes: Negative for blurred vision.  Respiratory: Negative for shortness of breath.   Gastrointestinal: Positive for heartburn.  Neurological: Positive for tingling and numbness.  All other systems reviewed and are negative.      Objective:   Physical Exam Vitals reviewed.  Constitutional:      General: She is not in acute distress.    Appearance: She is well-developed and well-nourished.  HENT:     Head: Normocephalic and atraumatic.     Right Ear: Tympanic membrane normal.     Left Ear: Tympanic membrane normal.     Mouth/Throat:     Mouth: Oropharynx is clear and moist.  Eyes:     Pupils: Pupils are equal, round, and reactive to light.  Neck:     Thyroid: No thyromegaly.  Cardiovascular:     Rate and Rhythm: Normal rate and regular rhythm.     Pulses: Intact distal pulses.     Heart sounds: Normal heart sounds. No murmur heard.   Pulmonary:     Effort: Pulmonary effort is normal. No respiratory distress.     Breath sounds: Normal breath sounds. No wheezing.  Abdominal:     General: Bowel sounds are normal. There is no distension.     Palpations: Abdomen is soft.  Tenderness: There is no abdominal tenderness.  Musculoskeletal:        General: Tenderness present. No edema.     Cervical back: Normal range of motion and neck supple.     Comments: Pain in right knee with flexion and extension  Skin:    General: Skin is warm and dry.  Neurological:     Mental Status: She is alert and oriented to person, place, and time.      Cranial Nerves: No cranial nerve deficit.     Deep Tendon Reflexes: Reflexes are normal and symmetric.  Psychiatric:        Mood and Affect: Mood and affect normal.        Behavior: Behavior normal.        Thought Content: Thought content normal.        Judgment: Judgment normal.       BP (!) 169/80   Pulse (!) 57   Temp 98.3 F (36.8 C) (Temporal)   Ht 5' 2" (1.575 m)   Wt 204 lb (92.5 kg)   BMI 37.31 kg/m      Assessment & Plan:  Jackie Ayala comes in today with chief complaint of Leg Pain (Patient states they feel weak, numbing burning and weakness in them.with cramping.)   Diagnosis and orders addressed:  1. Essential hypertension -Will add lisinopril 20 mg today -Daily blood pressure log given with instructions on how to fill out and told to bring to next visit -Dash diet information given -Exercise encouraged - Stress Management  -Continue current meds -RTO in 2 weeks  - atenolol (TENORMIN) 50 MG tablet; One tab by mouth every day. Needs appointment for future refills.  Dispense: 90 tablet; Refill: 3 - lisinopril (ZESTRIL) 20 MG tablet; Take 1 tablet (20 mg total) by mouth daily.  Dispense: 90 tablet; Refill: 3 - CMP14+EGFR - CBC with Differential/Platelet  2. Gastroesophageal reflux disease, unspecified whether esophagitis present - omeprazole (PRILOSEC) 40 MG capsule; Take 1 capsule (40 mg total) by mouth daily. (Needs to be seen before next refill)  Dispense: 90 capsule; Refill: 3 - CMP14+EGFR - CBC with Differential/Platelet  3. Obesity (BMI 35.0-39.9 without comorbidity) - CMP14+EGFR - CBC with Differential/Platelet  4. Prediabetes - Bayer DCA Hb A1c Waived - CMP14+EGFR - CBC with Differential/Platelet - Lipid panel - Microalbumin / creatinine urine ratio  5. Colon cancer screening - Cologuard  6. Strain of right knee, initial encounter Rest Ice  ROM exercises encouraged Wear a brace while working No other NSAID's while taking diclofenac   - diclofenac (VOLTAREN) 75 MG EC tablet; Take 1 tablet (75 mg total) by mouth 2 (two) times daily.  Dispense: 60 tablet; Refill: 1   Labs pending Health Maintenance reviewed Diet and exercise encouraged  Follow up plan: 2-4 weeks to recheck HTN   Evelina Dun, FNP

## 2020-05-13 NOTE — Patient Instructions (Signed)

## 2020-05-14 LAB — MICROALBUMIN / CREATININE URINE RATIO
Creatinine, Urine: 264 mg/dL
Microalb/Creat Ratio: 3 mg/g creat (ref 0–29)
Microalbumin, Urine: 7.9 ug/mL

## 2020-05-14 LAB — CMP14+EGFR
ALT: 20 IU/L (ref 0–32)
AST: 19 IU/L (ref 0–40)
Albumin/Globulin Ratio: 1.3 (ref 1.2–2.2)
Albumin: 4.3 g/dL (ref 3.8–4.8)
Alkaline Phosphatase: 81 IU/L (ref 44–121)
BUN/Creatinine Ratio: 13 (ref 12–28)
BUN: 13 mg/dL (ref 8–27)
Bilirubin Total: 0.3 mg/dL (ref 0.0–1.2)
CO2: 25 mmol/L (ref 20–29)
Calcium: 9.4 mg/dL (ref 8.7–10.3)
Chloride: 104 mmol/L (ref 96–106)
Creatinine, Ser: 1.03 mg/dL — ABNORMAL HIGH (ref 0.57–1.00)
GFR calc Af Amer: 67 mL/min/{1.73_m2} (ref >59)
GFR calc non Af Amer: 58 mL/min/{1.73_m2} — ABNORMAL LOW (ref >59)
Globulin, Total: 3.2 g/dL (ref 1.5–4.5)
Glucose: 110 mg/dL — ABNORMAL HIGH (ref 65–99)
Potassium: 4.5 mmol/L (ref 3.5–5.2)
Sodium: 140 mmol/L (ref 134–144)
Total Protein: 7.5 g/dL (ref 6.0–8.5)

## 2020-05-14 LAB — CBC WITH DIFFERENTIAL/PLATELET
Basophils Absolute: 0 10*3/uL (ref 0.0–0.2)
Basos: 1 %
EOS (ABSOLUTE): 0.1 10*3/uL (ref 0.0–0.4)
Eos: 2 %
Hematocrit: 40.1 % (ref 34.0–46.6)
Hemoglobin: 13.7 g/dL (ref 11.1–15.9)
Immature Grans (Abs): 0 10*3/uL (ref 0.0–0.1)
Immature Granulocytes: 0 %
Lymphocytes Absolute: 2.1 10*3/uL (ref 0.7–3.1)
Lymphs: 37 %
MCH: 31.4 pg (ref 26.6–33.0)
MCHC: 34.2 g/dL (ref 31.5–35.7)
MCV: 92 fL (ref 79–97)
Monocytes Absolute: 0.5 10*3/uL (ref 0.1–0.9)
Monocytes: 9 %
Neutrophils Absolute: 2.9 10*3/uL (ref 1.4–7.0)
Neutrophils: 51 %
Platelets: 243 10*3/uL (ref 150–450)
RBC: 4.37 x10E6/uL (ref 3.77–5.28)
RDW: 12.5 % (ref 11.7–15.4)
WBC: 5.7 10*3/uL (ref 3.4–10.8)

## 2020-05-14 LAB — LIPID PANEL
Chol/HDL Ratio: 3.8 ratio (ref 0.0–4.4)
Cholesterol, Total: 188 mg/dL (ref 100–199)
HDL: 50 mg/dL (ref >39)
LDL Chol Calc (NIH): 119 mg/dL — ABNORMAL HIGH (ref 0–99)
Triglycerides: 105 mg/dL (ref 0–149)
VLDL Cholesterol Cal: 19 mg/dL (ref 5–40)

## 2020-05-15 ENCOUNTER — Other Ambulatory Visit: Payer: Self-pay | Admitting: *Deleted

## 2020-05-15 MED ORDER — ROSUVASTATIN CALCIUM 5 MG PO TABS: 5.0000 mg | ORAL_TABLET | Freq: Every day | ORAL | 3 refills | Status: DC

## 2020-05-30 ENCOUNTER — Encounter: Payer: Self-pay | Admitting: Family

## 2020-05-30 ENCOUNTER — Ambulatory Visit (INDEPENDENT_AMBULATORY_CARE_PROVIDER_SITE_OTHER): Payer: 59 | Admitting: Family

## 2020-05-30 ENCOUNTER — Other Ambulatory Visit: Payer: Self-pay

## 2020-05-30 VITALS — BP 122/67 | HR 52 | Temp 97.9°F | Ht 62.0 in | Wt 203.6 lb

## 2020-05-30 DIAGNOSIS — I10 Essential (primary) hypertension: Secondary | ICD-10-CM | POA: Diagnosis not present

## 2020-05-30 DIAGNOSIS — E669 Obesity, unspecified: Secondary | ICD-10-CM

## 2020-05-30 DIAGNOSIS — S86911D Strain of unspecified muscle(s) and tendon(s) at lower leg level, right leg, subsequent encounter: Secondary | ICD-10-CM

## 2020-05-30 NOTE — Patient Instructions (Signed)

## 2020-05-30 NOTE — Progress Notes (Signed)
Subjective:    Patient ID: Jackie Ayala, female    DOB: 22-Dec-1960, 60 y.o.   MRN: [id number]  Chief Complaint  Patient presents with  . Hypertension  . Leg Pain    Left leg pain worse. Right better    PT presents to the office today to recheck HTN. Pt's BP is at goal. Jackie Ayala was seen 04/24/20 and we started her on Lisinopril 20 mg.   Jackie Ayala also reports her right knee is improved since taking diclofenac 75 mg.  Hypertension This is a chronic problem. The current episode started more than 1 year ago. The problem has been resolved since onset. The problem is controlled. Pertinent negatives include no malaise/fatigue, peripheral edema or shortness of breath. Risk factors for coronary artery disease include dyslipidemia and obesity. The current treatment provides moderate improvement.  Leg Pain       Review of Systems  Constitutional: Negative for malaise/fatigue.  Respiratory: Negative for shortness of breath.   All other systems reviewed and are negative.      Objective:   Physical Exam Vitals reviewed.  Constitutional:      General: Jackie Ayala is not in acute distress.    Appearance: Jackie Ayala is well-developed.  HENT:     Head: Normocephalic and atraumatic.     Right Ear: Tympanic membrane normal.     Left Ear: Tympanic membrane normal.  Eyes:     Pupils: Pupils are equal, round, and reactive to light.  Neck:     Thyroid: No thyromegaly.  Cardiovascular:     Rate and Rhythm: Normal rate and regular rhythm.     Heart sounds: Normal heart sounds. No murmur heard.   Pulmonary:     Effort: Pulmonary effort is normal. No respiratory distress.     Breath sounds: Normal breath sounds. No wheezing.  Abdominal:     General: Bowel sounds are normal. There is no distension.     Palpations: Abdomen is soft.     Tenderness: There is no abdominal tenderness.  Musculoskeletal:        General: No tenderness. Normal range of motion.     Cervical back: Normal range of motion and neck supple.   Skin:    General: Skin is warm and dry.  Neurological:     Mental Status: Jackie Ayala is alert and oriented to person, place, and time.     Cranial Nerves: No cranial nerve deficit.     Deep Tendon Reflexes: Reflexes are normal and symmetric.  Psychiatric:        Behavior: Behavior normal.        Thought Content: Thought content normal.        Judgment: Judgment normal.          BP 122/67   Pulse (!) 52   Temp 97.9 F (36.6 C)   Ht _0  (1.575 m)   Wt 203 lb 9.6 oz (92.4 kg)   BMI 37.24 kg/m   Assessment & Plan:  Rakeb Kibble comes in today with chief complaint of Hypertension and Leg Pain (Left leg pain worse. Right better )   Diagnosis and orders addressed:  1. Essential hypertension At goal today -Daily blood pressure log given with instructions on how to fill out and told to bring to next visit -Dash diet information given -Exercise encouraged - Stress Management  -Continue current meds - BMP8+EGFR  2. Obesity (BMI 35.0-39.9 without comorbidity) - BMP8+EGFR  3. Strain of right knee, subsequent encounter Continue NSAIDS ROM exercises  -  BMP8+EGFR   Labs pending Health Maintenance reviewed Diet and exercise encouraged  Follow up plan: 6 months   Jackie Dun, FNP

## 2020-05-31 LAB — BMP8+EGFR
BUN/Creatinine Ratio: 15 (ref 12–28)
BUN: 15 mg/dL (ref 8–27)
CO2: 24 mmol/L (ref 20–29)
Calcium: 9.5 mg/dL (ref 8.7–10.3)
Chloride: 103 mmol/L (ref 96–106)
Creatinine, Ser: 0.97 mg/dL (ref 0.57–1.00)
Glucose: 86 mg/dL (ref 65–99)
Potassium: 4.7 mmol/L (ref 3.5–5.2)
Sodium: 141 mmol/L (ref 134–144)
eGFR: 66 mL/min/{1.73_m2} (ref >59)

## 2020-07-08 ENCOUNTER — Encounter: Payer: Self-pay | Admitting: Family Medicine

## 2020-07-08 ENCOUNTER — Ambulatory Visit (INDEPENDENT_AMBULATORY_CARE_PROVIDER_SITE_OTHER): Payer: 59 | Admitting: Family Medicine

## 2020-07-08 DIAGNOSIS — J0110 Acute frontal sinusitis, unspecified: Secondary | ICD-10-CM

## 2020-07-08 MED ORDER — DOXYCYCLINE HYCLATE 100 MG PO TABS: 100.0000 mg | ORAL_TABLET | Freq: Two times a day (BID) | ORAL | 0 refills | Status: AC

## 2020-07-08 MED ORDER — CETIRIZINE HCL 10 MG PO TABS: 10.0000 mg | ORAL_TABLET | Freq: Every day | ORAL | 11 refills | Status: DC

## 2020-07-08 NOTE — Progress Notes (Signed)
   Virtual Visit  Note Due to COVID-19 pandemic this visit was conducted virtually. This visit type was conducted due to national recommendations for restrictions regarding the COVID-19 Pandemic (e.g. social distancing, sheltering in place) in an effort to limit this patient's exposure and mitigate transmission in our community. All issues noted in this document were discussed and addressed.  A physical exam was not performed with this format.  I connected with Jackie Ayala on 07/08/20 at [facility] by telephone and verified that I am speaking with the correct person using two identifiers. Jackie Ayala is currently located at home and her husband is currently with her during the visit. The provider, Gwenlyn Perking, FNP is located in their office at time of visit.  I discussed the limitations, risks, security and privacy concerns of performing an evaluation and management service by telephone and the availability of in person appointments. I also discussed with the patient that there may be a patient responsible charge related to this service. The patient expressed understanding and agreed to proceed.  CC: sinus congestion  History and Present Illness:  HPI  Jackie Ayala reports head and nasal congestion for about 1 month. She has also had a mild dry cough. She denies fever, chills,  headache, nausea, vomiting, abdominal pain, sneezing, or sore throat. Denies shortness of breath or chest pain. She has been taking coricidin and using a humidifier without improvement. She reports that her congestion seemed to worsen 2 days ago. She now reports pressure around her eyes. She denies history of seasonal allergies.     ROS As per HPI.   Observations/Objective: Alert and oriented x 3. Able to speak in full sentences without difficulty.    Assessment and Plan: [name] was seen today for sinus problem.  Diagnoses and all orders for this visit:  Acute non-recurrent frontal sinusitis Will sent in RX for  doxycycline given length of symptoms. Try zyrtec daily for congestion. Continue humifier and coricidin. Stay well hydrated. Return to office for new or worsening symptoms, or if symptoms persist.  -     doxycycline (VIBRA-TABS) 100 MG tablet; Take 1 tablet (100 mg total) by mouth 2 (two) times daily for 10 days. 1 po bid -     cetirizine (ZYRTEC) 10 MG tablet; Take 1 tablet (10 mg total) by mouth daily.   Follow Up Instructions: Return to office for new or worsening symptoms, or if symptoms persist.     I discussed the assessment and treatment plan with the patient. The patient was provided an opportunity to ask questions and all were answered. The patient agreed with the plan and demonstrated an understanding of the instructions.   The patient was advised to call back or seek an in-person evaluation if the symptoms worsen or if the condition fails to improve as anticipated.  The above assessment and management plan was discussed with the patient. The patient verbalized understanding of and has agreed to the management plan. Patient is aware to call the clinic if symptoms persist or worsen. Patient is aware when to return to the clinic for a follow-up visit. Patient educated on when it is appropriate to go to the emergency department.   Time call ended:  0945  I provided 13 minutes of  non face-to-face time during this encounter.    Gwenlyn Perking, FNP

## 2020-07-10 ENCOUNTER — Other Ambulatory Visit: Payer: Self-pay | Admitting: Family Medicine

## 2020-07-10 ENCOUNTER — Telehealth: Payer: Self-pay

## 2020-07-10 DIAGNOSIS — J0110 Acute frontal sinusitis, unspecified: Secondary | ICD-10-CM

## 2020-07-10 MED ORDER — AZITHROMYCIN 250 MG PO TABS: ORAL_TABLET | ORAL | 0 refills | Status: DC

## 2020-07-10 NOTE — Telephone Encounter (Signed)
Patient states she is taking the doxy with food and it is still making her very sick and wants something else called in please advise.

## 2020-07-10 NOTE — Telephone Encounter (Signed)
Sent in a Z-pak instead.

## 2020-07-10 NOTE — Telephone Encounter (Signed)
Patient aware.

## 2020-07-14 ENCOUNTER — Other Ambulatory Visit: Payer: Self-pay | Admitting: Family

## 2020-07-14 DIAGNOSIS — S86911A Strain of unspecified muscle(s) and tendon(s) at lower leg level, right leg, initial encounter: Secondary | ICD-10-CM

## 2020-07-22 ENCOUNTER — Encounter: Payer: Self-pay | Admitting: Family Medicine

## 2020-07-24 ENCOUNTER — Encounter: Payer: Self-pay | Admitting: Family Medicine

## 2020-07-29 ENCOUNTER — Ambulatory Visit (INDEPENDENT_AMBULATORY_CARE_PROVIDER_SITE_OTHER): Payer: 59 | Admitting: Family

## 2020-07-29 ENCOUNTER — Telehealth: Payer: Self-pay

## 2020-07-29 ENCOUNTER — Encounter: Payer: Self-pay | Admitting: Family

## 2020-07-29 DIAGNOSIS — M5442 Lumbago with sciatica, left side: Secondary | ICD-10-CM

## 2020-07-29 MED ORDER — BACLOFEN 10 MG PO TABS: 10.0000 mg | ORAL_TABLET | Freq: Three times a day (TID) | ORAL | 0 refills | Status: DC

## 2020-07-29 MED ORDER — DICLOFENAC SODIUM 75 MG PO TBEC: 75.0000 mg | DELAYED_RELEASE_TABLET | Freq: Two times a day (BID) | ORAL | 3 refills | Status: DC

## 2020-07-29 MED ORDER — GABAPENTIN 100 MG PO CAPS: 100.0000 mg | ORAL_CAPSULE | Freq: Three times a day (TID) | ORAL | 0 refills | Status: DC

## 2020-07-29 MED ORDER — PREDNISONE 10 MG (21) PO TBPK
ORAL_TABLET | ORAL | 0 refills | Status: DC
Start: 2020-07-29 — End: 2020-08-26

## 2020-07-29 NOTE — Telephone Encounter (Signed)
Pt called stating that she saw Christy on 05/13/20 regarding weakness/numbness with lower back and leg. Was told to call if pain/symptoms got worse. Pt says last night and today the pain really got worse and is barely able to walk. Wants to speak with nurse on what to do.

## 2020-07-29 NOTE — Telephone Encounter (Signed)
Pt is scheduled today.

## 2020-07-29 NOTE — Progress Notes (Signed)
Virtual Visit  Note Due to COVID-19 pandemic this visit was conducted virtually. This visit type was conducted due to national recommendations for restrictions regarding the COVID-19 Pandemic (e.g. social distancing, sheltering in place) in an effort to limit this patient's exposure and mitigate transmission in our community. All issues noted in this document were discussed and addressed.  A physical exam was not performed with this format.  I connected with Jackie Ayala on 07/29/20 at 12:40 pm  by telephone and verified that I am speaking with the correct person using two identifiers. Jackie Ayala is currently located at home and her husband is currently with her during visit. The provider, Evelina Dun, FNP is located in their office at time of visit.  I discussed the limitations, risks, security and privacy concerns of performing an evaluation and management service by telephone and the availability of in person appointments. I also discussed with the patient that there may be a patient responsible charge related to this service. The patient expressed understanding and agreed to proceed.   History and Present Illness:  Pt calls the office today with back pain that radiates down her thigh.  Back Pain This is a new problem. The current episode started more than 1 month ago. The problem occurs intermittently. The problem has been waxing and waning since onset. The pain is present in the lumbar spine. The quality of the pain is described as aching. The pain radiates to the left thigh. The pain is at a severity of 9/10. The pain is moderate. The symptoms are aggravated by lying down and standing. Associated symptoms include leg pain, tingling and weakness. Risk factors include obesity. She has tried NSAIDs for the symptoms. The treatment provided mild relief.      Review of Systems  Musculoskeletal: Positive for back pain.  Neurological: Positive for tingling and weakness.      Observations/Objective: No SOB or distress noted, griming in pain while moving around.   Assessment and Plan: 1. Acute left-sided low back pain with left-sided sciatica Rest ROM exercises  No other NSAID's while taking diclofenac Sedation precautions discussed with gabapentin and baclofen RTO if symptoms worsen or do not improve  - diclofenac (VOLTAREN) 75 MG EC tablet; Take 1 tablet (75 mg total) by mouth 2 (two) times daily.  Dispense: 60 tablet; Refill: 3 - predniSONE (STERAPRED UNI-PAK 21 TAB) 10 MG (21) TBPK tablet; Use as directed  Dispense: 21 tablet; Refill: 0 - gabapentin (NEURONTIN) 100 MG capsule; Take 1 capsule (100 mg total) by mouth 3 (three) times daily.  Dispense: 90 capsule; Refill: 0 - baclofen (LIORESAL) 10 MG tablet; Take 1 tablet (10 mg total) by mouth 3 (three) times daily.  Dispense: 30 each; Refill: 0      I discussed the assessment and treatment plan with the patient. The patient was provided an opportunity to ask questions and all were answered. The patient agreed with the plan and demonstrated an understanding of the instructions.   The patient was advised to call back or seek an in-person evaluation if the symptoms worsen or if the condition fails to improve as anticipated.  The above assessment and management plan was discussed with the patient. The patient verbalized understanding of and has agreed to the management plan. Patient is aware to call the clinic if symptoms persist or worsen. Patient is aware when to return to the clinic for a follow-up visit. Patient educated on when it is appropriate to go to the emergency department.  Time call ended:  12:52 pm   I provided 12 minutes of  non face-to-face time during this encounter.    Evelina Dun, FNP

## 2020-07-29 NOTE — Telephone Encounter (Signed)
Please schedule patient a telephone visit for today.  

## 2020-07-29 NOTE — Telephone Encounter (Signed)
  Patient states left side of lower back and legs are in sever pain and can barley walk. States the top of right leg is burning in the front. What can she do to help this?

## 2020-07-30 ENCOUNTER — Telehealth: Payer: Self-pay

## 2020-07-30 DIAGNOSIS — M5442 Lumbago with sciatica, left side: Secondary | ICD-10-CM

## 2020-07-31 NOTE — Telephone Encounter (Signed)
Pt called  She is still having a lot of pain  Can not work like this.  She is concerned that something MORE is going on.  She is wanting a note stating to be out of work "until better"   Does she need further follow, referral?   I will forward to [facility] for approval for this type of note.   I will write a note for 2 days until [facility] approves further note.

## 2020-08-01 ENCOUNTER — Ambulatory Visit (INDEPENDENT_AMBULATORY_CARE_PROVIDER_SITE_OTHER): Payer: 59

## 2020-08-01 ENCOUNTER — Encounter: Payer: Self-pay | Admitting: Family Medicine

## 2020-08-01 ENCOUNTER — Other Ambulatory Visit: Payer: Self-pay

## 2020-08-01 ENCOUNTER — Ambulatory Visit (INDEPENDENT_AMBULATORY_CARE_PROVIDER_SITE_OTHER): Payer: 59 | Admitting: Family Medicine

## 2020-08-01 VITALS — BP 141/67 | HR 56 | Temp 97.8°F

## 2020-08-01 DIAGNOSIS — M5442 Lumbago with sciatica, left side: Secondary | ICD-10-CM

## 2020-08-01 NOTE — Telephone Encounter (Signed)
Pt saw another provider today.

## 2020-08-01 NOTE — Patient Instructions (Signed)
Sciatica Rehab Ask your health care provider which exercises are safe for you. Do exercises exactly as told by your health care provider and adjust them as directed. It is normal to feel mild stretching, pulling, tightness, or discomfort as you do these exercises. Stop right away if you feel sudden pain or your pain gets worse. Do not begin these exercises until told by your health care provider. Stretching and range-of-motion exercises These exercises warm up your muscles and joints and improve the movement and flexibility of your hips and back. These exercises also help to relieve pain, numbness, and tingling. Sciatic nerve glide 1. Sit in a chair with your head facing down toward your chest. Place your hands behind your back. Let your shoulders slump forward. 2. Slowly straighten one of your legs while you tilt your head back as if you are looking toward the ceiling. Only straighten your leg as far as you can without making your symptoms worse. 3. Hold this position for __________ seconds. 4. Slowly return to the starting position. 5. Repeat with your other leg. Repeat __________ times. Complete this exercise __________ times a day. Knee to chest with hip adduction and internal rotation 1. Lie on your back on a firm surface with both legs straight. 2. Bend one of your knees and move it up toward your chest until you feel a gentle stretch in your lower back and buttock. Then, move your knee toward the shoulder that is on the opposite side from your leg. This is hip adduction and internal rotation. ? Hold your leg in this position by holding on to the front of your knee. 3. Hold this position for __________ seconds. 4. Slowly return to the starting position. 5. Repeat with your other leg. Repeat __________ times. Complete this exercise __________ times a day.   Prone extension on elbows 1. Lie on your abdomen on a firm surface. A bed may be too soft for this exercise. 2. Prop yourself up on  your elbows. 3. Use your arms to help lift your chest up until you feel a gentle stretch in your abdomen and your lower back. ? This will place some of your body weight on your elbows. If this is uncomfortable, try stacking pillows under your chest. ? Your hips should stay down, against the surface that you are lying on. Keep your hip and back muscles relaxed. 4. Hold this position for __________ seconds. 5. Slowly relax your upper body and return to the starting position. Repeat __________ times. Complete this exercise __________ times a day.   Strengthening exercises These exercises build strength and endurance in your back. Endurance is the ability to use your muscles for a long time, even after they get tired. Pelvic tilt This exercise strengthens the muscles that lie deep in the abdomen. 1. Lie on your back on a firm surface. Bend your knees and keep your feet flat on the floor. 2. Tense your abdominal muscles. Tip your pelvis up toward the ceiling and flatten your lower back into the floor. ? To help with this exercise, you may place a small towel under your lower back and try to push your back into the towel. 3. Hold this position for __________ seconds. 4. Let your muscles relax completely before you repeat this exercise. Repeat __________ times. Complete this exercise __________ times a day. Alternating arm and leg raises 1. Get on your hands and knees on a firm surface. If you are on a hard floor, you may want to  use padding, such as an exercise mat, to cushion your knees. 2. Line up your arms and legs. Your hands should be directly below your shoulders, and your knees should be directly below your hips. 3. Lift your left leg behind you. At the same time, raise your right arm and straighten it in front of you. ? Do not lift your leg higher than your hip. ? Do not lift your arm higher than your shoulder. ? Keep your abdominal and back muscles tight. ? Keep your hips facing the  ground. ? Do not arch your back. ? Keep your balance carefully, and do not hold your breath. 4. Hold this position for __________ seconds. 5. Slowly return to the starting position. 6. Repeat with your right leg and your left arm. Repeat __________ times. Complete this exercise __________ times a day.   Posture and body mechanics Good posture and healthy body mechanics can help to relieve stress in your body's tissues and joints. Body mechanics refers to the movements and positions of your body while you do your daily activities. Posture is part of body mechanics. Good posture means:  Your spine is in its natural S-curve position (neutral).  Your shoulders are pulled back slightly.  Your head is not tipped forward. Follow these guidelines to improve your posture and body mechanics in your everyday activities. Standing  When standing, keep your spine neutral and your feet about hip width apart. Keep a slight bend in your knees. Your ears, shoulders, and hips should line up.  When you do a task in which you stand in one place for a long time, place one foot up on a stable object that is 2-4 inches (5-10 cm) high, such as a footstool. This helps keep your spine neutral.   Sitting  When sitting, keep your spine neutral and keep your feet flat on the floor. Use a footrest, if necessary, and keep your thighs parallel to the floor. Avoid rounding your shoulders, and avoid tilting your head forward.  When working at a desk or a computer, keep your desk at a height where your hands are slightly lower than your elbows. Slide your chair under your desk so you are close enough to maintain good posture.  When working at a computer, place your monitor at a height where you are looking straight ahead and you do not have to tilt your head forward or downward to look at the screen.   Resting  When lying down and resting, avoid positions that are most painful for you.  If you have pain with activities  such as sitting, bending, stooping, or squatting, lie in a position in which your body does not bend very much. For example, avoid curling up on your side with your arms and knees near your chest (fetal position).  If you have pain with activities such as standing for a long time or reaching with your arms, lie with your spine in a neutral position and bend your knees slightly. Try the following positions: ? Lying on your side with a pillow between your knees. ? Lying on your back with a pillow under your knees. Lifting  When lifting objects, keep your feet at least shoulder width apart and tighten your abdominal muscles.  Bend your knees and hips and keep your spine neutral. It is important to lift using the strength of your legs, not your back. Do not lock your knees straight out.  Always ask for help to lift heavy or awkward objects.  This information is not intended to replace advice given to you by your health care provider. Make sure you discuss any questions you have with your health care provider. Document Revised: 07/01/2018 Document Reviewed: 03/31/2018 Elsevier Patient Education  2021 [employer] Inc.  

## 2020-08-01 NOTE — Progress Notes (Signed)
Assessment & Plan:  1. Acute left-sided low back pain with left-sided sciatica Sciatica rehab exercises provided to patient.  - DG Lumbar Spine 2-3 Views - Ambulatory referral to Physical Therapy - Ambulatory referral to Orthopedic Surgery   Follow up plan: Return if symptoms worsen or fail to improve.  Hendricks Limes, MSN, APRN, FNP-C [facility]  Subjective:   Patient ID: Daissy Yerian, female    DOB: 02-10-1961, 60 y.o.   MRN: [id number]  HPI: Suzzanne Brunkhorst is a 60 y.o. female presenting on 08/01/2020 for Sciatica (Patient had a phone visit on 07/29/20 for left lower back pain with sciatica. Patient states it is no better)  Patient had a visit three days ago due to low back pain with left sided sciatica, at which time she was prescribed baclofen, gabapentin, prednisone, and diclofenac. 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 She is in a wheelchair today and is rolling around in an office chair at home. She has also tried Voltaren gel, Icy Hot, Salonpas patches, and Lidocaine patches.    ROS: Negative unless specifically indicated above in HPI.   Relevant past medical history reviewed and updated as indicated.   Allergies and medications reviewed and updated.   Current Outpatient Medications:  .  atenolol (TENORMIN) 50 MG tablet, One tab by mouth every day. Needs appointment for future refills., Disp: 90 tablet, Rfl: 3 .  baclofen (LIORESAL) 10 MG tablet, Take 1 tablet (10 mg total) by mouth 3 (three) times daily., Disp: 30 each, Rfl: 0 .  cetirizine (ZYRTEC) 10 MG tablet, Take 1 tablet (10 mg total) by mouth daily., Disp: 30 tablet, Rfl: 11 .  Cholecalciferol (VITAMIN D-3 PO), Take by mouth. 1000 IUD 1-2 capsules daily, Disp: , Rfl:  .  diclofenac (VOLTAREN) 75 MG EC tablet, Take 1 tablet (75 mg total) by mouth 2 (two) times daily., Disp: 60 tablet,  Rfl: 3 .  gabapentin (NEURONTIN) 100 MG capsule, Take 1 capsule (100 mg total) by mouth 3 (three) times daily., Disp: 90 capsule, Rfl: 0 .  lisinopril (ZESTRIL) 20 MG tablet, Take 1 tablet (20 mg total) by mouth daily., Disp: 90 tablet, Rfl: 3 .  Omega-3 1000 MG CAPS, Take 1 capsule by mouth daily., Disp: , Rfl:  .  omeprazole (PRILOSEC) 40 MG capsule, Take 1 capsule (40 mg total) by mouth daily. (Needs to be seen before next refill), Disp: 90 capsule, Rfl: 3 .  predniSONE (STERAPRED UNI-PAK 21 TAB) 10 MG (21) TBPK tablet, Use as directed, Disp: 21 tablet, Rfl: 0 .  rosuvastatin (CRESTOR) 5 MG tablet, Take 1 tablet (5 mg total) by mouth daily., Disp: 90 tablet, Rfl: 3 .  Zinc 100 MG TABS, Take by mouth., Disp: , Rfl:   Allergies  Allergen Reactions  . Penicillins   . Sulfa Antibiotics     Objective:   BP (!) 141/67   Pulse (!) 56   Temp 97.8 F (36.6 C) (Temporal)   SpO2 95%    Physical Exam Vitals reviewed.  Constitutional:      General: She is not in acute distress.    Appearance: Normal appearance. She is not ill-appearing, toxic-appearing or diaphoretic.  HENT:     Head: Normocephalic and atraumatic.  Eyes:     General: No scleral icterus.       Right eye: No discharge.  Left eye: No discharge.     Conjunctiva/sclera: Conjunctivae normal.  Cardiovascular:     Rate and Rhythm: Normal rate.  Pulmonary:     Effort: Pulmonary effort is normal. No respiratory distress.  Musculoskeletal:        General: Normal range of motion.     Cervical back: Normal range of motion.     Lumbar back: Tenderness present.  Skin:    General: Skin is warm and dry.     Capillary Refill: Capillary refill takes less than 2 seconds.  Neurological:     General: No focal deficit present.     Mental Status: She is alert and oriented to person, place, and time. Mental status is at baseline.  Psychiatric:        Mood and Affect: Mood normal.        Behavior: Behavior normal.         Thought Content: Thought content normal.        Judgment: Judgment normal.

## 2020-08-12 ENCOUNTER — Ambulatory Visit: Payer: 59 | Admitting: Physical Therapy

## 2020-08-12 ENCOUNTER — Telehealth: Payer: Self-pay | Admitting: Family

## 2020-08-12 ENCOUNTER — Other Ambulatory Visit: Payer: Self-pay | Admitting: Family

## 2020-08-12 DIAGNOSIS — M5442 Lumbago with sciatica, left side: Secondary | ICD-10-CM

## 2020-08-26 ENCOUNTER — Other Ambulatory Visit: Payer: Self-pay

## 2020-08-26 ENCOUNTER — Ambulatory Visit (INDEPENDENT_AMBULATORY_CARE_PROVIDER_SITE_OTHER): Payer: 59 | Admitting: Family

## 2020-08-26 ENCOUNTER — Encounter: Payer: Self-pay | Admitting: Family

## 2020-08-26 VITALS — BP 143/78 | HR 64 | Temp 98.2°F | Ht 62.0 in | Wt 195.0 lb

## 2020-08-26 DIAGNOSIS — M5442 Lumbago with sciatica, left side: Secondary | ICD-10-CM

## 2020-08-26 DIAGNOSIS — M5416 Radiculopathy, lumbar region: Secondary | ICD-10-CM

## 2020-08-26 DIAGNOSIS — Z01818 Encounter for other preprocedural examination: Secondary | ICD-10-CM | POA: Diagnosis not present

## 2020-08-26 NOTE — Patient Instructions (Signed)
Lumbosacral Radiculopathy Lumbosacral radiculopathy is a condition that involves the spinal nerves and nerve roots in the low back and bottom of the spine. The condition develops when these nerves and nerve roots move out of place or become inflamed and cause symptoms. What are the causes? This condition may be caused by:  Pressure from a disk that bulges out of place (herniated disk). A disk is a plate of soft cartilage that separates bones in the spine.  Disk changes that occur with age (disk degeneration).  A narrowing of the bones of the lower back (spinal stenosis).  A tumor.  An infection.  An injury that places sudden pressure on the disks that cushion the bones of your lower spine. What increases the risk? You are more likely to develop this condition if:  You are a female who is 59-7 years old.  You are a female who is 47-38 years old.  You use improper technique when lifting things.  You are overweight or live a sedentary lifestyle.  You smoke.  Your work requires frequent lifting.  You do repetitive activities that strain the spine. What are the signs or symptoms? Symptoms of this condition include:  Pain that goes down from your back into your legs (sciatica), usually on one side of the body. This is the most common symptom. The pain may be worse with sitting, coughing, or sneezing.  Pain and numbness in your legs.  Muscle weakness.  Tingling.  Loss of bladder control or bowel control.   How is this diagnosed? This condition may be diagnosed based on:  Your symptoms and medical history.  A physical exam. If the pain is lasting, you may have tests, such as:  MRI scan.  X-ray.  CT scan.  A type of X-ray used to examine the spinal canal after injecting a dye into your spine (myelogram).  A test to measure how electrical impulses move through a nerve (nerve conduction study). How is this treated? Treatment may depend on the cause of the condition  and may include:  Working with a physical therapist.  Taking pain medicine.  Applying heat and ice to affected areas.  Doing stretches to improve flexibility.  Doing exercises to strengthen back muscles.  Having chiropractic spinal manipulation.  Using transcutaneous electrical nerve stimulation (TENS) therapy.  Getting a steroid injection in the spine. In some cases, no treatment is needed. If the condition is long-lasting (chronic), or if symptoms are severe, treatment may involve surgery or lifestyle changes, such as following a weight-loss plan. Follow these instructions at home: Activity  Avoid bending and other activities that make the problem worse.  Maintain a proper position when standing or sitting: ? When standing, keep your upper back and neck straight, with your shoulders pulled back. Avoid slouching. ? When sitting, keep your back straight and relax your shoulders. Do not round your shoulders or pull them backward.  Do not sit or stand in one place for long periods of time.  Take brief periods of rest throughout the day. This will reduce your pain. It is usually better to rest by lying down or standing, not sitting.  When you are resting for longer periods, mix in some mild activity or stretching between periods of rest. This will help to prevent stiffness and pain.  Get regular exercise. Ask your health care provider what activities are safe for you. If you were shown how to do any exercises or stretches, do them as directed by your health care provider.  Do not lift anything that is heavier than 10 lb (4.5 kg) or the limit that you are told by your health care provider. Always use proper lifting technique, which includes: ? Bending your knees. ? Keeping the load close to your body. ? Avoiding twisting. Managing pain  If directed, put ice on the affected area: ? Put ice in a plastic bag. ? Place a towel between your skin and the bag. ? Leave the ice on for  20 minutes, 2-3 times a day.  If directed, apply heat to the affected area as often as told by your health care provider. Use the heat source that your health care provider recommends, such as a moist heat pack or a heating pad. ? Place a towel between your skin and the heat source. ? Leave the heat on for 20-30 minutes. ? Remove the heat if your skin turns bright red. This is especially important if you are unable to feel pain, heat, or cold. You may have a greater risk of getting burned.  Take over-the-counter and prescription medicines only as told by your health care provider. General instructions  Sleep on a firm mattress in a comfortable position. Try lying on your side with your knees slightly bent. If you lie on your back, put a pillow under your knees.  Do not drive or use heavy machinery while taking prescription pain medicine.  If your health care provider prescribed a diet or exercise program, follow it as directed.  Keep all follow-up visits as told by your health care provider. This is important. Contact a health care provider if:  Your pain does not improve over time, even when taking pain medicines. Get help right away if:  You develop severe pain.  Your pain suddenly gets worse.  You develop increasing weakness in your legs.  You lose the ability to control your bladder or bowel.  You have difficulty walking or balancing.  You have a fever. Summary  Lumbosacral radiculopathy is a condition that occurs when the spinal nerves and nerve roots in the lower part of the spine move out of place or become inflamed and cause symptoms.  Symptoms include pain, numbness, and tingling that go down from your back into your legs (sciatica), muscle weakness, and loss of bladder control or bowel control.  If directed, apply ice or heat to the affected area as told by your health care provider.  Follow instructions about activity, rest, and proper lifting technique. This  information is not intended to replace advice given to you by your health care provider. Make sure you discuss any questions you have with your health care provider. Document Revised: 01/18/2020 Document Reviewed: 01/18/2020 Elsevier Patient Education  2021 [employer].

## 2020-08-26 NOTE — Progress Notes (Signed)
   Subjective:    Patient ID: Jackie Ayala, female    DOB: May 24, 1960, 60 y.o.   MRN: [id number]  Chief Complaint  Patient presents with  . Pre-op Exam   Pt presents to the office today for pre-op exam. She is scheduled for left L3 on 09/06/20. Back Pain This is a chronic problem. The current episode started more than 1 year ago. The problem occurs intermittently. The problem has been waxing and waning since onset. The pain is present in the lumbar spine. The quality of the pain is described as aching. The pain radiates to the left thigh. The pain is at a severity of 8/10. The pain is moderate. She has tried bed rest, analgesics and heat for the symptoms. The treatment provided mild relief.      Review of Systems  Musculoskeletal: Positive for back pain.  All other systems reviewed and are negative.      Objective:   Physical Exam Vitals reviewed.  Constitutional:      General: She is not in acute distress.    Appearance: She is well-developed.  HENT:     Head: Normocephalic and atraumatic.     Right Ear: Tympanic membrane normal.     Left Ear: Tympanic membrane normal.  Eyes:     Pupils: Pupils are equal, round, and reactive to light.  Neck:     Thyroid: No thyromegaly.  Cardiovascular:     Rate and Rhythm: Normal rate and regular rhythm.     Heart sounds: Normal heart sounds. No murmur heard.   Pulmonary:     Effort: Pulmonary effort is normal. No respiratory distress.     Breath sounds: Normal breath sounds. No wheezing.  Abdominal:     General: Bowel sounds are normal. There is no distension.     Palpations: Abdomen is soft.     Tenderness: There is no abdominal tenderness.  Musculoskeletal:        General: Tenderness present.     Cervical back: Normal range of motion and neck supple.     Comments: Pain in lower lumber with flexion and extension, unable to perform SLR because of pain  Skin:    General: Skin is warm and dry.  Neurological:     Mental Status:  She is alert and oriented to person, place, and time.     Cranial Nerves: No cranial nerve deficit.     Deep Tendon Reflexes: Reflexes are normal and symmetric.  Psychiatric:        Behavior: Behavior normal.        Thought Content: Thought content normal.        Judgment: Judgment normal.     BP (!) 143/78   Pulse 64   Temp 98.2 F (36.8 C) (Temporal)   Ht $R'5\' 2"'iO$  (1.575 m)   Wt 195 lb (88.5 kg)   BMI 35.67 kg/m        Assessment & Plan:  Jackie Ayala comes in today with chief complaint of Pre-op Exam   Diagnosis and orders addressed:  1. Pre-op evaluation - EKG 12-Lead - CMP14+EGFR - CBC with Differential/Platelet  2. Acute left-sided low back pain with left-sided sciatica - CMP14+EGFR - CBC with Differential/Platelet  3. Lumbar radiculopathy   Labs pending  Rest Keep surgery appt     Evelina Dun, FNP

## 2020-08-27 LAB — CBC WITH DIFFERENTIAL/PLATELET
Basophils Absolute: 0 10*3/uL (ref 0.0–0.2)
Basos: 1 %
EOS (ABSOLUTE): 0 10*3/uL (ref 0.0–0.4)
Eos: 1 %
Hematocrit: 37.9 % (ref 34.0–46.6)
Hemoglobin: 13.2 g/dL (ref 11.1–15.9)
Immature Grans (Abs): 0 10*3/uL (ref 0.0–0.1)
Immature Granulocytes: 0 %
Lymphocytes Absolute: 1.4 10*3/uL (ref 0.7–3.1)
Lymphs: 33 %
MCH: 32.1 pg (ref 26.6–33.0)
MCHC: 34.8 g/dL (ref 31.5–35.7)
MCV: 92 fL (ref 79–97)
Monocytes Absolute: 0.3 10*3/uL (ref 0.1–0.9)
Monocytes: 8 %
Neutrophils Absolute: 2.4 10*3/uL (ref 1.4–7.0)
Neutrophils: 57 %
Platelets: 258 10*3/uL (ref 150–450)
RBC: 4.11 x10E6/uL (ref 3.77–5.28)
RDW: 13 % (ref 11.7–15.4)
WBC: 4.2 10*3/uL (ref 3.4–10.8)

## 2020-08-27 LAB — CMP14+EGFR
ALT: 23 IU/L (ref 0–32)
AST: 18 IU/L (ref 0–40)
Albumin/Globulin Ratio: 1.6 (ref 1.2–2.2)
Albumin: 4.3 g/dL (ref 3.8–4.8)
Alkaline Phosphatase: 66 IU/L (ref 44–121)
BUN/Creatinine Ratio: 11 — ABNORMAL LOW (ref 12–28)
BUN: 9 mg/dL (ref 8–27)
Bilirubin Total: 0.3 mg/dL (ref 0.0–1.2)
CO2: 26 mmol/L (ref 20–29)
Calcium: 9.5 mg/dL (ref 8.7–10.3)
Chloride: 103 mmol/L (ref 96–106)
Creatinine, Ser: 0.85 mg/dL (ref 0.57–1.00)
Globulin, Total: 2.7 g/dL (ref 1.5–4.5)
Glucose: 126 mg/dL — ABNORMAL HIGH (ref 65–99)
Potassium: 4.4 mmol/L (ref 3.5–5.2)
Sodium: 143 mmol/L (ref 134–144)
Total Protein: 7 g/dL (ref 6.0–8.5)
eGFR: 77 mL/min/{1.73_m2} (ref >59)

## 2020-08-29 ENCOUNTER — Telehealth: Payer: Self-pay | Admitting: Family

## 2020-08-29 NOTE — Telephone Encounter (Signed)
PHARMACY DELETED AND THE DRUG STORE ADDED

## 2020-09-03 ENCOUNTER — Other Ambulatory Visit: Payer: Self-pay | Admitting: Family

## 2020-09-04 ENCOUNTER — Other Ambulatory Visit: Payer: Self-pay | Admitting: Family

## 2020-09-04 DIAGNOSIS — M5442 Lumbago with sciatica, left side: Secondary | ICD-10-CM

## 2020-09-04 DIAGNOSIS — J0110 Acute frontal sinusitis, unspecified: Secondary | ICD-10-CM

## 2020-09-13 ENCOUNTER — Telehealth: Payer: Self-pay | Admitting: Family

## 2020-09-13 DIAGNOSIS — M5442 Lumbago with sciatica, left side: Secondary | ICD-10-CM

## 2020-09-13 MED ORDER — GABAPENTIN 100 MG PO CAPS: 100.0000 mg | ORAL_CAPSULE | Freq: Three times a day (TID) | ORAL | 1 refills | Status: DC

## 2020-09-13 NOTE — Telephone Encounter (Signed)
  Prescription Request  09/13/2020  What is the name of the medication or equipment? gabapentin (NEURONTIN) 100 MG capsule   Have you contacted your pharmacy to request a refill? (if applicable) yes  Which pharmacy would you like this sent to? [facility]    Patient notified that their request is being sent to the clinical staff for review and that they should receive a response within 2 business days.

## 2020-09-13 NOTE — Telephone Encounter (Signed)
sent 

## 2020-10-01 ENCOUNTER — Other Ambulatory Visit: Payer: Self-pay | Admitting: Family

## 2020-10-01 ENCOUNTER — Telehealth: Payer: Self-pay | Admitting: Family

## 2020-10-01 DIAGNOSIS — K219 Gastro-esophageal reflux disease without esophagitis: Secondary | ICD-10-CM

## 2020-10-01 DIAGNOSIS — I10 Essential (primary) hypertension: Secondary | ICD-10-CM

## 2020-10-01 DIAGNOSIS — M5442 Lumbago with sciatica, left side: Secondary | ICD-10-CM

## 2020-10-01 DIAGNOSIS — J0110 Acute frontal sinusitis, unspecified: Secondary | ICD-10-CM

## 2020-10-01 MED ORDER — LISINOPRIL 20 MG PO TABS: 20.0000 mg | ORAL_TABLET | Freq: Every day | ORAL | 3 refills | Status: DC

## 2020-10-01 MED ORDER — ROSUVASTATIN CALCIUM 5 MG PO TABS: 5.0000 mg | ORAL_TABLET | Freq: Every day | ORAL | 3 refills | Status: DC

## 2020-10-01 MED ORDER — OMEPRAZOLE 40 MG PO CPDR: 40.0000 mg | DELAYED_RELEASE_CAPSULE | Freq: Every day | ORAL | 3 refills | Status: DC

## 2020-10-01 MED ORDER — CETIRIZINE HCL 10 MG PO TABS: 10.0000 mg | ORAL_TABLET | Freq: Every day | ORAL | 11 refills | Status: DC

## 2020-10-01 MED ORDER — GABAPENTIN 100 MG PO CAPS: 100.0000 mg | ORAL_CAPSULE | Freq: Three times a day (TID) | ORAL | 1 refills | Status: DC

## 2020-10-01 NOTE — Telephone Encounter (Signed)
All meds sent to the drug store in [place] patient aware

## 2020-10-01 NOTE — Telephone Encounter (Signed)
  Prescription Request  10/01/2020  What is the name of the medication or equipment?  omeprazole (PRILOSEC) 40 MG capsule  cetirizine (ZYRTEC) 10 MG tablet  lisinopril (ZESTRIL) 20 MG tablet  rosuvastatin (CRESTOR) 5 MG tablet  baclofen (LIORESAL) 10 MG tablet  gabapentin (NEURONTIN) 100 MG capsule (do not need a refill but when she does needs to go to [facility])    Have you contacted your pharmacy to request a refill? (if applicable) no was told to call office  Which pharmacy would you like this sent to? [facility] [place].   Patient notified that their request is being sent to the clinical staff for review and that they should receive a response within 2 business days.

## 2020-10-23 ENCOUNTER — Ambulatory Visit: Payer: 59 | Attending: Orthopedic Surgery | Admitting: Physical Therapy

## 2020-10-23 ENCOUNTER — Encounter: Payer: Self-pay | Admitting: Physical Therapy

## 2020-10-23 DIAGNOSIS — M545 Low back pain, unspecified: Secondary | ICD-10-CM | POA: Insufficient documentation

## 2020-10-23 NOTE — Therapy (Signed)
[facility] [place], [place], [address] Phone: [phone]   Fax:  [phone]  Physical Therapy Evaluation  Patient Details  Name: Jackie Ayala MRN: [id number] Date of Birth: [birth year] Referring Provider (PT): Melina Schools MD   Encounter Date: 10/23/2020   PT End of Session - 10/23/20 1135     Visit Number 1    Number of Visits 12    Date for PT Re-Evaluation 11/20/20    PT Start Time 0952    PT Stop Time 1025    PT Time Calculation (min) 33 min    Activity Tolerance Patient tolerated treatment well    Behavior During Therapy [facility] for tasks assessed/performed             Past Medical History:  Diagnosis Date   Hypertension     Past Surgical History:  Procedure Laterality Date   ABDOMINAL HYSTERECTOMY  2004   ovaries remain   CHOLECYSTECTOMY      There were no vitals filed for this visit.    Subjective Assessment - 10/23/20 1138     Subjective COVID-19 screen performed prior to patient entering clinic.  The patient presents to the clinic today s/p left Le foraminectomy and discectomy performed on 09/06/20.  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  She is very pleased with her surgical outcome thus far.  She is wearing a back brace but can begin weaning out of it.  She reports no pain today.    Pertinent History HTN, Cholecystectomy.    Patient Stated Goals Get back to a regular happy life.    Currently in Pain? No/denies                [facility] PT Assessment - 10/23/20 0001       Assessment   Medical Diagnosis L3 foraminotomy and discectomy    Referring Provider (PT) Melina Schools MD    Onset Date/Surgical Date --   09/06/20 (surgery date).     Precautions   Precaution Comments Follow lumbar discectomy protocol.      Restrictions   Weight Bearing Restrictions No      Balance Screen   Has the patient fallen in the past 6 months No    Has the patient had a  decrease in activity level because of a fear of falling?  Yes    Is the patient reluctant to leave their home because of a fear of falling?  No      Home Environment   Living Environment Private residence      Prior Function   Level of Independence Independent      Observation/Other Assessments   Observations Lumbar incision looks to be well healed.      Posture/Postural Control   Posture/Postural Control No significant limitations      Deep Tendon Reflexes   DTR Assessment Site Patella;Achilles    Patella DTR 2+    Achilles DTR 2+      ROM / Strength   AROM / PROM / Strength AROM;Strength      AROM   Overall AROM Comments Assesed in supine the patient's bilateral hip flexion is WNL.      Strength   Overall Strength Comments Normal bilateral knee and ankle strength.      Palpation   Palpation comment Minimal at most incisional tenderness.      Ambulation/Gait   Gait Comments The patient was walking with a normal gait cycle wiht her  lumbar brace donned.                        Objective measurements completed on examination: See above findings.                    PT Long Term Goals - 10/23/20 1203       PT LONG TERM GOAL #1   Title Independent with a HEP.    Time 4    Period Weeks    Status New      PT LONG TERM GOAL #2   Title Perform ADL's with pain not > 2/10.    Time 4    Period Weeks    Status New      PT LONG TERM GOAL #3   Title Return to work.    Time 4    Period Weeks    Status New                    Plan - 10/23/20 1157     Clinical Impression Statement The patient presents to OPPT s/p L3 foraminotomy and discectomy performed on 09/06/20.  She is very pleased with her progress thus far.  She has been complinat to wearing her lumbar brace but can begin to wean out of it.  Her bilateral hip flexion is normal.  Bilateral knee and ankle strength is normal.  Bilateral LE DTR's are normal and she exhibits a  normal gait cycle.  Patient will benefit from skilled physical therapy intervention to address pain and deficits.    Personal Factors and Comorbidities Comorbidity 1;Other    Comorbidities HTN, Cholecystectomy.    Examination-Activity Limitations Other    Examination-Participation Restrictions Other    Stability/Clinical Decision Making Stable/Uncomplicated    Clinical Decision Making Low    Rehab Potential Excellent    PT Frequency 3x / week    PT Duration 4 weeks    PT Treatment/Interventions ADLs/Self Care Home Management;Cryotherapy;Electrical Stimulation;Ultrasound;Moist Heat;Functional mobility training;Therapeutic activities;Therapeutic exercise;Manual techniques;Patient/family education;Passive range of motion    PT Next Visit Plan Progress per lumbar discectomy protocol.  STW/M and modalites as needed.    Consulted and Agree with Plan of Care Patient             Patient will benefit from skilled therapeutic intervention in order to improve the following deficits and impairments:  Decreased activity tolerance, Pain  Visit Diagnosis: Acute left-sided low back pain without sciatica - Plan: PT plan of care cert/re-cert     Problem List Patient Active Problem List   Diagnosis Date Noted   Prediabetes 05/13/2020   Obesity (BMI 35.0-39.9 without comorbidity) 08/27/2016   Essential hypertension 06/12/2014   GERD (gastroesophageal reflux disease) 06/12/2014    Biviana Saddler, [place] MPT 10/23/2020, 12:05 PM  [facility] [address] [place], [place], [address] Phone: [phone]   Fax:  [phone]  Name: Jackie Ayala MRN: [id number] Date of Birth: February 08, 1961

## 2020-10-24 ENCOUNTER — Ambulatory Visit: Payer: 59 | Admitting: Physical Therapy

## 2020-10-24 ENCOUNTER — Other Ambulatory Visit: Payer: Self-pay

## 2020-10-24 DIAGNOSIS — M545 Low back pain, unspecified: Secondary | ICD-10-CM | POA: Diagnosis not present

## 2020-10-24 NOTE — Therapy (Signed)
[facility] [place], [place], [address] Phone: [phone]   Fax:  [phone]  Physical Therapy Treatment  Patient Details  Name: Jackie Ayala MRN: [id number] Date of Birth: 04-27-1960 Referring Provider (PT): Melina Schools MD   Encounter Date: 10/24/2020   PT End of Session - 10/24/20 0943     Visit Number 2    Number of Visits 12    Date for PT Re-Evaluation 11/20/20    PT Start Time 0900    PT Stop Time 0943    PT Time Calculation (min) 43 min    Activity Tolerance Patient tolerated treatment well    Behavior During Therapy [facility] for tasks assessed/performed             Past Medical History:  Diagnosis Date   Hypertension     Past Surgical History:  Procedure Laterality Date   ABDOMINAL HYSTERECTOMY  2004   ovaries remain   CHOLECYSTECTOMY      There were no vitals filed for this visit.   Subjective Assessment - 10/24/20 0903     Subjective COVID-19 screen performed prior to patient entering clinic. Patient reported doing well upon arrival.    Pertinent History HTN, Cholecystectomy.    Patient Stated Goals Get back to a regular happy life.    Currently in Pain? No/denies                               [facility] Adult PT Treatment/Exercise - 10/24/20 0001       Self-Care   Self-Care ADL's;Lifting;Posture;Other Self-Care Comments    Other Self-Care Comments  HEP provided for all above      Exercises   Exercises Lumbar;Knee/Hip      Lumbar Exercises: Aerobic   Nustep 51mn L3 UE/LE      Lumbar Exercises: Supine   Ab Set 20 reps;3 seconds    Glut Set 3 seconds;20 reps    Clam 20 reps   with abdominal bracing   Bent Knee Raise 3 seconds   2x10 with abdominal bracing     Knee/Hip Exercises: Standing   Heel Raises Both;2 sets;10 reps    Hip Flexion Stengthening;Both;AROM;2 sets;10 reps;Knee straight    Hip Abduction Stengthening;AROM;Both;10 reps;Knee straight;2 sets     Forward Step Up Both;2 sets;10 reps;Step Height: 4";Hand Hold: 2    Rocker Board 3 minutes   137m calf stretch/82m85mbalance                   PT Education - 10/24/20 0910     Education Details HEP    Person(s) Educated Patient    Methods Explanation;Demonstration;Handout    Comprehension Verbalized understanding;Returned demonstration                 PT Long Term Goals - 10/23/20 1203       PT LONG TERM GOAL #1   Title Independent with a HEP.    Time 4    Period Weeks    Status New      PT LONG TERM GOAL #2   Title Perform ADL's with pain not > 2/10.    Time 4    Period Weeks    Status New      PT LONG TERM GOAL #3   Title Return to work.    Time 4    Period Weeks    Status New  Plan - 10/24/20 0937     Clinical Impression Statement Patient tolerated treatment well today. Today focused on posture awareness techniques and abdominal bracing with slow progression. Patient has reported no pain and able to progress with good understanding of exercises today. Patient issued HEP. Patient current goals progressing. Patient continues to wear her back brace for safety and reminder to move without re-injury.    Personal Factors and Comorbidities Comorbidity 1;Other    Comorbidities HTN, Cholecystectomy.    Examination-Activity Limitations Other    Examination-Participation Restrictions Other    Stability/Clinical Decision Making Stable/Uncomplicated    Rehab Potential Excellent    PT Frequency 3x / week    PT Duration 4 weeks    PT Treatment/Interventions ADLs/Self Care Home Management;Cryotherapy;Electrical Stimulation;Ultrasound;Moist Heat;Functional mobility training;Therapeutic activities;Therapeutic exercise;Manual techniques;Patient/family education;Passive range of motion    PT Next Visit Plan Progress per lumbar discectomy protocol.  STW/M and modalites as needed.    Consulted and Agree with Plan of Care Patient              Patient will benefit from skilled therapeutic intervention in order to improve the following deficits and impairments:  Decreased activity tolerance, Pain  Visit Diagnosis: Acute left-sided low back pain without sciatica     Problem List Patient Active Problem List   Diagnosis Date Noted   Prediabetes 05/13/2020   Obesity (BMI 35.0-39.9 without comorbidity) 08/27/2016   Essential hypertension 06/12/2014   GERD (gastroesophageal reflux disease) 06/12/2014    Polette Nofsinger P, PTA 10/24/2020, 9:47 AM  [facility] [address] [place], [place], [address] Phone: [phone]   Fax:  [phone]  Name: Jackie Ayala MRN: [id number] Date of Birth: Jan 03, 1961

## 2020-10-24 NOTE — Patient Instructions (Addendum)
Pelvic Tilt: Posterior - Legs Bent (Supine)  Tighten stomach and flatten back by rolling pelvis down. Hold _10___ seconds. Relax. Repeat _10-30___ times per set. Do __2__ sets per session. Do _2___ sessions per day.   Bent Leg Lift (Hook-Lying)  Tighten stomach and slowly raise right leg _5___ inches from floor. Keep trunk rigid. Hold _3___ seconds. Repeat _10___ times per set. Do ___2-3_ sets per session. Do __2__ sessions per day.  Brushing Teeth    Place one foot on ledge and one hand on counter. Bend other knee slightly to keep back straight.  Copyright  VHI. All rights reserved.  Refrigerator   Squat with knees apart to reach lower shelves and drawers.   Copyright  VHI. All rights reserved.  Laundry Basket   Squat down and hold basket close to stand. Use leg muscles to do the work.   Copyright  VHI. All rights reserved.  Housework - Vacuuming   Hold the vacuum with arm held at side. Step back and forth to move it, keeping head up. Avoid twisting.   Copyright  VHI. All rights reserved.  Housework - Wiping   Position yourself as close as possible to reach work surface. Avoid straining your back.   Copyright  VHI. All rights reserved.  Gardening - Mowing   Keep arms close to sides and walk with lawn mower.   Copyright  VHI. All rights reserved.  Sleeping on Side   Place pillow between knees. Use cervical support under neck and a roll around waist as needed.   Copyright  VHI. All rights reserved.  Log Roll   Lying on back, bend left knee and place left arm across chest. Roll all in one movement to the right. Reverse to roll to the left. Always move as one unit.   Copyright  VHI. All rights reserved.  Stand to Sit / Sit to Stand   To sit: Bend knees to lower self onto front edge of chair, then scoot back on seat. To stand: Reverse sequence by placing one foot forward, and scoot to front of seat. Use rocking motion to stand  up.  Copyright  VHI. All rights reserved.  Posture - Standing   Good posture is important. Avoid slouching and forward head thrust. Maintain curve in low back and align ears over shoul- ders, hips over ankles.   Copyright  VHI. All rights reserved.  Posture - Sitting   Sit upright, head facing forward. Try using a roll to support lower back. Keep shoulders relaxed, and avoid rounded back. Keep hips level with knees. Avoid crossing legs for long periods.   Copyright  VHI. All rights reserved.  Computer Work   Position work to face forward. Use proper work and seat height. Keep shoulders back and down, wrists straight, and elbows at right angles. Use chair that provides full back support. Add footrest and lumbar roll as needed.   Copyright  VHI. All rights reserved.                

## 2020-10-29 ENCOUNTER — Other Ambulatory Visit: Payer: Self-pay

## 2020-10-29 ENCOUNTER — Ambulatory Visit: Payer: 59 | Admitting: Physical Therapy

## 2020-10-29 DIAGNOSIS — M545 Low back pain, unspecified: Secondary | ICD-10-CM | POA: Diagnosis not present

## 2020-10-29 NOTE — Therapy (Signed)
[facility] [place], [place], [address] Phone: [phone]   Fax:  [phone]  Physical Therapy Treatment  Patient Details  Name: Jackie Ayala MRN: [id number] Date of Birth: 01-Jan-1961 Referring Provider (PT): Melina Schools MD   Encounter Date: 10/29/2020   PT End of Session - 10/29/20 0941     Visit Number 3    Number of Visits 12    Date for PT Re-Evaluation 11/20/20    PT Start Time 0900    PT Stop Time 0956    PT Time Calculation (min) 56 min             Past Medical History:  Diagnosis Date   Hypertension     Past Surgical History:  Procedure Laterality Date   ABDOMINAL HYSTERECTOMY  2004   ovaries remain   CHOLECYSTECTOMY      There were no vitals filed for this visit.   Subjective Assessment - 10/29/20 0941     Subjective COVID-19 screen performed prior to patient entering clinic.  Back sore today.    Pertinent History HTN, Cholecystectomy.    Patient Stated Goals Get back to a regular happy life.    Currently in Pain? Yes    Pain Score 3     Pain Location Back    Pain Orientation Left                               [facility] Adult PT Treatment/Exercise - 10/29/20 0001       Exercises   Exercises Knee/Hip      Lumbar Exercises: Aerobic   Nustep Level 4 x 15 minutes.      Modalities   Modalities Electrical Stimulation;Moist Heat      Moist Heat Therapy   Number Minutes Moist Heat 20 Minutes    Moist Heat Location Lumbar Spine      Electrical Stimulation   Electrical Stimulation Location LB    Electrical Stimulation Action IFC at 80-150 Hz.    Electrical Stimulation Parameters 40% scan x 20 minutes.      Manual Therapy   Manual Therapy Soft tissue mobilization    Soft tissue mobilization right sdly position with pillow between knees for comfort:  STW/M to patient's left low back including ischemic release technique x 8 minutes.                          PT Long Term Goals - 10/23/20 1203       PT LONG TERM GOAL #1   Title Independent with a HEP.    Time 4    Period Weeks    Status New      PT LONG TERM GOAL #2   Title Perform ADL's with pain not > 2/10.    Time 4    Period Weeks    Status New      PT LONG TERM GOAL #3   Title Return to work.    Time 4    Period Weeks    Status New                   Plan - 10/29/20 [id number]     Clinical Impression Statement Patient's left low back exhibited significant tone and tenderness today.  She did very well with STW/M.    Personal Factors and Comorbidities Comorbidity 1;Other    Comorbidities HTN, Cholecystectomy.  Examination-Activity Limitations Other    Examination-Participation Restrictions Other    Stability/Clinical Decision Making Stable/Uncomplicated    Rehab Potential Excellent    PT Duration 4 weeks    PT Treatment/Interventions ADLs/Self Care Home Management;Cryotherapy;Electrical Stimulation;Ultrasound;Moist Heat;Functional mobility training;Therapeutic activities;Therapeutic exercise;Manual techniques;Patient/family education;Passive range of motion    PT Next Visit Plan Progress per lumbar discectomy protocol.  STW/M and modalites as needed.             Patient will benefit from skilled therapeutic intervention in order to improve the following deficits and impairments:  Decreased activity tolerance, Pain  Visit Diagnosis: Acute left-sided low back pain without sciatica     Problem List Patient Active Problem List   Diagnosis Date Noted   Prediabetes 05/13/2020   Obesity (BMI 35.0-39.9 without comorbidity) 08/27/2016   Essential hypertension 06/12/2014   GERD (gastroesophageal reflux disease) 06/12/2014    Jackie Ayala, [place] MPT 10/29/2020, 10:02 AM  [facility] [address] [place], [place], [address] Phone: [phone]   Fax:  [phone]  Name: Jackie Ayala MRN:  [id number] Date of Birth: 1960/12/19

## 2020-10-31 ENCOUNTER — Other Ambulatory Visit: Payer: Self-pay

## 2020-10-31 ENCOUNTER — Ambulatory Visit: Payer: 59 | Admitting: Physical Therapy

## 2020-10-31 DIAGNOSIS — M545 Low back pain, unspecified: Secondary | ICD-10-CM | POA: Diagnosis not present

## 2020-10-31 NOTE — Therapy (Signed)
[facility] [place], [place], [address] Phone: [phone]   Fax:  [phone]  Physical Therapy Treatment  Patient Details  Name: Jackie Ayala MRN: [id number] Date of Birth: 31-May-1960 Referring Provider (PT): Melina Schools MD   Encounter Date: 10/31/2020   PT End of Session - 10/31/20 1022     Visit Number 4    Number of Visits 12    Date for PT Re-Evaluation 11/20/20    PT Start Time 0900    PT Stop Time 0957    PT Time Calculation (min) 57 min    Activity Tolerance Patient tolerated treatment well    Behavior During Therapy [facility] for tasks assessed/performed             Past Medical History:  Diagnosis Date   Hypertension     Past Surgical History:  Procedure Laterality Date   ABDOMINAL HYSTERECTOMY  2004   ovaries remain   CHOLECYSTECTOMY      There were no vitals filed for this visit.   Subjective Assessment - 10/31/20 1023     Subjective COVID-19 screen performed prior to patient entering clinic.  Last treatment helped.    Pertinent History HTN, Cholecystectomy.    Patient Stated Goals Get back to a regular happy life.    Currently in Pain? Yes    Pain Score 2     Pain Location Back    Pain Orientation Left                               [facility] Adult PT Treatment/Exercise - 10/31/20 0001       Exercises   Exercises Knee/Hip      Lumbar Exercises: Aerobic   Nustep Level 4 x 15 minutes.      Modalities   Modalities Electrical Stimulation;Moist Heat      Moist Heat Therapy   Number Minutes Moist Heat 20 Minutes    Moist Heat Location Lumbar Spine      Electrical Stimulation   Electrical Stimulation Location LB    Electrical Stimulation Action IFC at 80-150 Hz.    Electrical Stimulation Parameters 40% scan x 20 minutes.    Electrical Stimulation Goals Pain;Tone      Manual Therapy   Manual Therapy Soft tissue mobilization    Soft tissue mobilization Right sdly  position with pyillow between knees for comfort:  STW/M x 10 minutes to patient's left low back to reduce tone.                         PT Long Term Goals - 10/23/20 1203       PT LONG TERM GOAL #1   Title Independent with a HEP.    Time 4    Period Weeks    Status New      PT LONG TERM GOAL #2   Title Perform ADL's with pain not > 2/10.    Time 4    Period Weeks    Status New      PT LONG TERM GOAL #3   Title Return to work.    Time 4    Period Weeks    Status New                   Plan - 10/31/20 1026     Clinical Impression Statement Excellent response to STW/M.  Progress with ther ex  per protocol.    Personal Factors and Comorbidities Comorbidity 1;Other    Comorbidities HTN, Cholecystectomy.    Examination-Activity Limitations Other    Examination-Participation Restrictions Other    Stability/Clinical Decision Making Stable/Uncomplicated    Rehab Potential Excellent    PT Frequency 3x / week    PT Duration 4 weeks    PT Treatment/Interventions ADLs/Self Care Home Management;Cryotherapy;Electrical Stimulation;Ultrasound;Moist Heat;Functional mobility training;Therapeutic activities;Therapeutic exercise;Manual techniques;Patient/family education;Passive range of motion    PT Next Visit Plan Progress per lumbar discectomy protocol.  STW/M and modalites as needed.    Consulted and Agree with Plan of Care Patient             Patient will benefit from skilled therapeutic intervention in order to improve the following deficits and impairments:  Decreased activity tolerance, Pain  Visit Diagnosis: Acute left-sided low back pain without sciatica     Problem List Patient Active Problem List   Diagnosis Date Noted   Prediabetes 05/13/2020   Obesity (BMI 35.0-39.9 without comorbidity) 08/27/2016   Essential hypertension 06/12/2014   GERD (gastroesophageal reflux disease) 06/12/2014    Baudelia Schroepfer, [place] MPT 10/31/2020, 10:29 AM  [facility] [address] [place], [place], [address] Phone: [phone]   Fax:  [phone]  Name: Jackie Ayala MRN: [id number] Date of Birth: 04-06-1960

## 2020-11-05 ENCOUNTER — Other Ambulatory Visit: Payer: Self-pay

## 2020-11-05 ENCOUNTER — Ambulatory Visit: Payer: 59 | Admitting: Physical Therapy

## 2020-11-05 DIAGNOSIS — M545 Low back pain, unspecified: Secondary | ICD-10-CM

## 2020-11-05 NOTE — Therapy (Signed)
[facility] [place], [place], [address] Phone: [phone]   Fax:  [phone]  Physical Therapy Treatment  Patient Details  Name: Jackie Ayala MRN: [id number] Date of Birth: 1960-12-04 Referring Provider (PT): Melina Schools MD   Encounter Date: 11/05/2020   PT End of Session - 11/05/20 1036     Visit Number 5    Number of Visits 12    Date for PT Re-Evaluation 11/20/20    PT Start Time 0900    PT Stop Time 0959    PT Time Calculation (min) 59 min    Activity Tolerance Patient tolerated treatment well    Behavior During Therapy [facility] for tasks assessed/performed             Past Medical History:  Diagnosis Date   Hypertension     Past Surgical History:  Procedure Laterality Date   ABDOMINAL HYSTERECTOMY  2004   ovaries remain   CHOLECYSTECTOMY      There were no vitals filed for this visit.   Subjective Assessment - 11/05/20 1035     Subjective COVID-19 screen performed prior to patient entering clinic.  Doing well.    Pertinent History HTN, Cholecystectomy.    Patient Stated Goals Get back to a regular happy life.    Currently in Pain? Yes    Pain Location Back    Pain Orientation Left    Pain Descriptors / Indicators Dull    Pain Type Surgical pain                               [facility] Adult PT Treatment/Exercise - 11/05/20 0001       Exercises   Exercises Knee/Hip      Lumbar Exercises: Aerobic   Nustep Level 4 x 11 minutes.      Modalities   Modalities Moist Heat;Electrical Stimulation      Moist Heat Therapy   Number Minutes Moist Heat 20 Minutes    Moist Heat Location Lumbar Spine      Electrical Stimulation   Electrical Stimulation Location LB    Electrical Stimulation Action IFC at 80-150 Hz.    Electrical Stimulation Parameters 40% scan x 20 minutes.    Electrical Stimulation Goals Pain;Tone      Manual Therapy   Manual Therapy Soft tissue mobilization     Soft tissue mobilization Riht sdly position with pillow between knees for comfort:  STW/M to patient's left low back musculature to reduce tone x 17 minutes.                         PT Long Term Goals - 10/23/20 1203       PT LONG TERM GOAL #1   Title Independent with a HEP.    Time 4    Period Weeks    Status New      PT LONG TERM GOAL #2   Title Perform ADL's with pain not > 2/10.    Time 4    Period Weeks    Status New      PT LONG TERM GOAL #3   Title Return to work.    Time 4    Period Weeks    Status New                   Plan - 11/05/20 1040     Clinical Impression Statement Patient  has responded very well to soft tissue work.  She felt good after treatment.  Will progress per protocol into core exercises.    Personal Factors and Comorbidities Comorbidity 1;Other    Comorbidities HTN, Cholecystectomy.    Examination-Activity Limitations Other    Examination-Participation Restrictions Other    Stability/Clinical Decision Making Stable/Uncomplicated    Rehab Potential Excellent    PT Frequency 3x / week    PT Duration 4 weeks    PT Treatment/Interventions ADLs/Self Care Home Management;Cryotherapy;Electrical Stimulation;Ultrasound;Moist Heat;Functional mobility training;Therapeutic activities;Therapeutic exercise;Manual techniques;Patient/family education;Passive range of motion    PT Next Visit Plan Progress per lumbar discectomy protocol.  STW/M and modalites as needed.    Consulted and Agree with Plan of Care Patient             Patient will benefit from skilled therapeutic intervention in order to improve the following deficits and impairments:  Decreased activity tolerance, Pain  Visit Diagnosis: Acute left-sided low back pain without sciatica     Problem List Patient Active Problem List   Diagnosis Date Noted   Prediabetes 05/13/2020   Obesity (BMI 35.0-39.9 without comorbidity) 08/27/2016   Essential hypertension  06/12/2014   GERD (gastroesophageal reflux disease) 06/12/2014    Jackie Ayala, [place] MPT 11/05/2020, 10:44 AM  [facility] [address] [place], [place], [address] Phone: [phone]   Fax:  [phone]  Name: Jackie Ayala MRN: [id number] Date of Birth: 01-Feb-1961

## 2020-11-07 ENCOUNTER — Other Ambulatory Visit: Payer: Self-pay

## 2020-11-07 ENCOUNTER — Ambulatory Visit: Payer: 59 | Admitting: *Deleted

## 2020-11-07 DIAGNOSIS — M545 Low back pain, unspecified: Secondary | ICD-10-CM | POA: Diagnosis not present

## 2020-11-07 NOTE — Therapy (Signed)
[facility] [place], [place], [address] Phone: [phone]   Fax:  [phone]  Physical Therapy Treatment  Patient Details  Name: Jackie Ayala MRN: [id number] Date of Birth: Sep 23, 1960 Referring Provider (PT): Melina Schools MD   Encounter Date: 11/07/2020   PT End of Session - 11/07/20 1709     Visit Number 6    Number of Visits 12    Date for PT Re-Evaluation 11/20/20    PT Start Time 0900    PT Stop Time 0946    PT Time Calculation (min) 46 min             Past Medical History:  Diagnosis Date   Hypertension     Past Surgical History:  Procedure Laterality Date   ABDOMINAL HYSTERECTOMY  2004   ovaries remain   CHOLECYSTECTOMY      There were no vitals filed for this visit.   Subjective Assessment - 11/07/20 0912     Subjective COVID-19 screen performed prior to patient entering clinic.  Doing well.1-2/10    Pertinent History HTN, Cholecystectomy.    Patient Stated Goals Get back to a regular happy life.    Currently in Pain? Yes    Pain Score 2     Pain Orientation Left    Pain Descriptors / Indicators Dull    Pain Type Surgical pain                               [facility] Adult PT Treatment/Exercise - 11/07/20 0001       Self-Care   Self-Care ADL's;Lifting;Posture;Other Self-Care Comments    Posture hinge bending discussion with lifting, Pelvic neutral pos., Logroll      Therapeutic Activites    Therapeutic Activities ADL's;Lifting      Exercises   Exercises Knee/Hip      Lumbar Exercises: Aerobic   Tread Mill 1.5 MPH x 10 mins    Nustep Level 4 x 11 minutes.      Lumbar Exercises: Standing   Other Standing Lumbar Exercises Practiced hinge bending, using Short-stop position3x10, then progressed to chair squat still with focus on hinge bending      Lumbar Exercises: Supine   Ab Set 10 reps;5 seconds    Glut Set 10 reps;5 seconds    Bent Knee Raise 20 reps;5  seconds                         PT Long Term Goals - 10/23/20 1203       PT LONG TERM GOAL #1   Title Independent with a HEP.    Time 4    Period Weeks    Status New      PT LONG TERM GOAL #2   Title Perform ADL's with pain not > 2/10.    Time 4    Period Weeks    Status New      PT LONG TERM GOAL #3   Title Return to work.    Time 4    Period Weeks    Status New                   Plan - 11/07/20 1712     Clinical Impression Statement Pt arrived today doing well with low pain levels. Rx focused on spine friendly mvmnt patterns with ADLs with focus on hinge bending and AB bracing. Core activation  exs were also reviewed / performed    Personal Factors and Comorbidities Comorbidity 1;Other    Comorbidities HTN, Cholecystectomy.    Examination-Activity Limitations Other    Stability/Clinical Decision Making Stable/Uncomplicated    Rehab Potential Excellent    PT Frequency 3x / week    PT Treatment/Interventions ADLs/Self Care Home Management;Cryotherapy;Electrical Stimulation;Ultrasound;Moist Heat;Functional mobility training;Therapeutic activities;Therapeutic exercise;Manual techniques;Patient/family education;Passive range of motion    PT Next Visit Plan Progress per lumbar discectomy protocol.  STW/M and modalites as needed.             Patient will benefit from skilled therapeutic intervention in order to improve the following deficits and impairments:  Decreased activity tolerance, Pain  Visit Diagnosis: Acute left-sided low back pain without sciatica     Problem List Patient Active Problem List   Diagnosis Date Noted   Prediabetes 05/13/2020   Obesity (BMI 35.0-39.9 without comorbidity) 08/27/2016   Essential hypertension 06/12/2014   GERD (gastroesophageal reflux disease) 06/12/2014    Kameah Rawl,CHRIS, PTA 11/07/2020, 5:14 PM  [facility] [address] [place], [place],  [address] Phone: [phone]   Fax:  [phone]  Name: Braleigh Foore MRN: [id number] Date of Birth: 1960-05-18

## 2020-11-12 ENCOUNTER — Other Ambulatory Visit: Payer: Self-pay

## 2020-11-12 ENCOUNTER — Ambulatory Visit: Payer: 59 | Admitting: *Deleted

## 2020-11-12 DIAGNOSIS — M545 Low back pain, unspecified: Secondary | ICD-10-CM | POA: Diagnosis not present

## 2020-11-12 NOTE — Therapy (Signed)
[facility] [place], [place], [address] Phone: [phone]   Fax:  [phone]  Physical Therapy Treatment  Patient Details  Name: Jackie Ayala MRN: [id number] Date of Birth: 1960/06/16 Referring Provider (PT): Melina Schools MD   Encounter Date: 11/12/2020   PT End of Session - 11/12/20 0916     Visit Number 7    Number of Visits 12    Date for PT Re-Evaluation 11/20/20    PT Start Time 0900    PT Stop Time 0950    PT Time Calculation (min) 50 min             Past Medical History:  Diagnosis Date   Hypertension     Past Surgical History:  Procedure Laterality Date   ABDOMINAL HYSTERECTOMY  2004   ovaries remain   CHOLECYSTECTOMY      There were no vitals filed for this visit.   Subjective Assessment - 11/12/20 0904     Subjective COVID-19 screen performed prior to patient entering clinic.  Doing well.1-2/10    Pertinent History HTN, Cholecystectomy.    Patient Stated Goals Get back to a regular happy life.    Pain Score 2     Pain Location Back    Pain Orientation Left    Pain Descriptors / Indicators Dull    Pain Type Surgical pain                               [facility] Adult PT Treatment/Exercise - 11/12/20 0001       Self-Care   Self-Care ADL's;Lifting;Posture;Other Self-Care Comments    Posture hinge bending discussion with lifting, Pelvic neutral pos., "short stop" position cues      Therapeutic Activites    Therapeutic Activities ADL's;Lifting      Exercises   Exercises Knee/Hip      Lumbar Exercises: Aerobic   Tread Mill 1.5 MPH x 13 mins      Lumbar Exercises: Standing   Other Standing Lumbar Exercises Practiced hinge bending, using Short-stop position3x10, then progressed to chair squat still with focus on hinge bending. Pt still needing cues for technique.    Other Standing Lumbar Exercises Modified bird-dog arm raise x6 hold for 5 sec holds each UEl and then  LEs x 6 and 5 sec holds each.      Lumbar Exercises: Quadruped   [place]/Old Horse 20 reps   2x10                        PT Long Term Goals - 10/23/20 1203       PT LONG TERM GOAL #1   Title Independent with a HEP.    Time 4    Period Weeks    Status New      PT LONG TERM GOAL #2   Title Perform ADL's with pain not > 2/10.    Time 4    Period Weeks    Status New      PT LONG TERM GOAL #3   Title Return to work.    Time 4    Period Weeks    Status New                   Plan - 11/12/20 0917     Personal Factors and Comorbidities Comorbidity 1;Other    Comorbidities HTN, Cholecystectomy.    Examination-Activity Limitations Other  Examination-Participation Restrictions Other    Stability/Clinical Decision Making Stable/Uncomplicated    Rehab Potential Excellent    PT Frequency 3x / week    PT Treatment/Interventions ADLs/Self Care Home Management;Cryotherapy;Electrical Stimulation;Ultrasound;Moist Heat;Functional mobility training;Therapeutic activities;Therapeutic exercise;Manual techniques;Patient/family education;Passive range of motion    PT Next Visit Plan Progress per lumbar discectomy protocol.  STW/M and modalites as needed.             Patient will benefit from skilled therapeutic intervention in order to improve the following deficits and impairments:     Visit Diagnosis: Acute left-sided low back pain without sciatica     Problem List Patient Active Problem List   Diagnosis Date Noted   Prediabetes 05/13/2020   Obesity (BMI 35.0-39.9 without comorbidity) 08/27/2016   Essential hypertension 06/12/2014   GERD (gastroesophageal reflux disease) 06/12/2014    Jackie Ayala,CHRIS, PTA 11/12/2020, 10:07 AM  [facility] [address] [place], [place], [address] Phone: [phone]   Fax:  [phone]  Name: Jackie Ayala MRN: [id number] Date of Birth: Mar 14, 1961

## 2020-11-14 ENCOUNTER — Other Ambulatory Visit: Payer: Self-pay

## 2020-11-14 ENCOUNTER — Ambulatory Visit: Payer: 59 | Admitting: *Deleted

## 2020-11-14 DIAGNOSIS — M545 Low back pain, unspecified: Secondary | ICD-10-CM | POA: Diagnosis not present

## 2020-11-14 NOTE — Therapy (Signed)
[facility] [place], [place], [address] Phone: [phone]   Fax:  [phone]  Physical Therapy Treatment  Patient Details  Name: Jackie Ayala MRN: [id number] Date of Birth: [birth year] Referring Provider (PT): Melina Schools MD   Encounter Date: 11/14/2020   PT End of Session - 11/14/20 1003     Visit Number 8    Number of Visits 12    Date for PT Re-Evaluation 11/20/20    PT Start Time 0900    PT Stop Time 0949    PT Time Calculation (min) 49 min             Past Medical History:  Diagnosis Date   Hypertension     Past Surgical History:  Procedure Laterality Date   ABDOMINAL HYSTERECTOMY  2004   ovaries remain   CHOLECYSTECTOMY      There were no vitals filed for this visit.   Subjective Assessment - 11/14/20 0913     Subjective COVID-19 screen performed prior to patient entering clinic.  Doing well.1-2/10    Pertinent History HTN, Cholecystectomy.    Patient Stated Goals Get back to a regular happy life.    Currently in Pain? Yes    Pain Score 3     Pain Location Back    Pain Orientation Left    Pain Descriptors / Indicators Dull    Pain Type Surgical pain                               [facility] Adult PT Treatment/Exercise - 11/14/20 0001       Self-Care   Self-Care --   AB bracing and log roll   Posture hinge bending discussion with lifting, Pelvic neutral pos., "short stop" position cues      Therapeutic Activites    Therapeutic Activities ADL's;Lifting      Exercises   Exercises Knee/Hip      Lumbar Exercises: Aerobic   Nustep Level 4 x 15 minutes.      Lumbar Exercises: Standing   Other Standing Lumbar Exercises Practiced hinge bending, using Short-stop position3x10, then progressed to chair squat still with focus on hinge bending.    Other Standing Lumbar Exercises Modified bird-dog arm raise x6 hold for 5 sec holds each UEl and then LEs x 6 and 5 sec holds each.       Lumbar Exercises: Supine   Ab Set 10 reps;5 seconds    Bent Knee Raise --   x6 each leg hold 10 secs   Bridge 10 reps;5 seconds    Other Supine Lumbar Exercises AB brace then ball adductor squeeze x 10 hold 10 secs, clam yellow band x 10 hold 10 secs                         PT Long Term Goals - 10/23/20 1203       PT LONG TERM GOAL #1   Title Independent with a HEP.    Time 4    Period Weeks    Status New      PT LONG TERM GOAL #2   Title Perform ADL's with pain not > 2/10.    Time 4    Period Weeks    Status New      PT LONG TERM GOAL #3   Title Return to work.    Time 4    Period Weeks  Status New                   Plan - 11/14/20 1006     Clinical Impression Statement Pt arrived today doing ok with low pain levels LBP. 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 Rx focused on core strengthening and good movement patterns.    Personal Factors and Comorbidities Comorbidity 1;Other    Comorbidities HTN, Cholecystectomy.    Examination-Activity Limitations Other    Examination-Participation Restrictions Other    Stability/Clinical Decision Making Stable/Uncomplicated    Rehab Potential Excellent    PT Frequency 3x / week    PT Duration 4 weeks    PT Treatment/Interventions ADLs/Self Care Home Management;Cryotherapy;Electrical Stimulation;Ultrasound;Moist Heat;Functional mobility training;Therapeutic activities;Therapeutic exercise;Manual techniques;Patient/family education;Passive range of motion    PT Next Visit Plan Progress per lumbar discectomy protocol.  STW/M and modalites as needed.    Consulted and Agree with Plan of Care Patient             Patient will benefit from skilled therapeutic intervention in order to improve the following deficits and impairments:     Visit Diagnosis: Acute left-sided low back pain without sciatica     Problem List Patient Active  Problem List   Diagnosis Date Noted   Prediabetes 05/13/2020   Obesity (BMI 35.0-39.9 without comorbidity) 08/27/2016   Essential hypertension 06/12/2014   GERD (gastroesophageal reflux disease) 06/12/2014    Jackie Ayala,CHRIS, PTA 11/14/2020, 10:14 AM  [facility] [address] [place], [place], [address] Phone: [phone]   Fax:  [phone]  Name: Jackie Ayala MRN: [id number] Date of Birth: [birth year]

## 2020-11-19 ENCOUNTER — Ambulatory Visit: Payer: 59 | Admitting: Physical Therapy

## 2020-11-19 ENCOUNTER — Encounter: Payer: Self-pay | Admitting: Physical Therapy

## 2020-11-19 ENCOUNTER — Other Ambulatory Visit: Payer: Self-pay

## 2020-11-19 DIAGNOSIS — M545 Low back pain, unspecified: Secondary | ICD-10-CM

## 2020-11-19 NOTE — Therapy (Signed)
[facility] [place], [place], [address] Phone: [phone]   Fax:  [phone]  Physical Therapy Treatment  Patient Details  Name: Jackie Ayala MRN: [id number] Date of Birth: 11-16-1960 Referring Provider (PT): Melina Schools MD   Encounter Date: 11/19/2020   PT End of Session - 11/19/20 0903     Visit Number 9    Number of Visits 12    Date for PT Re-Evaluation 11/20/20    PT Start Time 0901    PT Stop Time 0947    PT Time Calculation (min) 46 min    Equipment Utilized During Treatment Back brace    Activity Tolerance Patient tolerated treatment well    Behavior During Therapy [facility] for tasks assessed/performed             Past Medical History:  Diagnosis Date   Hypertension     Past Surgical History:  Procedure Laterality Date   ABDOMINAL HYSTERECTOMY  2004   ovaries remain   CHOLECYSTECTOMY      There were no vitals filed for this visit.   Subjective Assessment - 11/19/20 0902     Subjective COVID-19 screen performed prior to patient entering clinic.  Gets small sticks of discomfort and burning in low back.    Pertinent History HTN, Cholecystectomy.    Patient Stated Goals Get back to a regular happy life.    Currently in Pain? Yes    Pain Score 2     Pain Location Back    Pain Orientation Lower    Pain Descriptors / Indicators Burning    Pain Type Surgical pain    Pain Onset More than a month ago    Pain Frequency Constant                [facility] PT Assessment - 11/19/20 0001       Assessment   Medical Diagnosis L3 foraminotomy and discectomy    Referring Provider (PT) Melina Schools MD    Onset Date/Surgical Date 09/06/20    Next MD Visit 12/02/2020      Precautions   Precaution Comments Follow lumbar discectomy protocol.      Restrictions   Weight Bearing Restrictions No                           [facility] Adult PT Treatment/Exercise - 11/19/20 0001       Lumbar  Exercises: Aerobic   Tread Mill 1.5 mph x10 min    Nustep L4 x10 min      Lumbar Exercises: Standing   Other Standing Lumbar Exercises Hinge training for short stop position in standing x20 reps    Other Standing Lumbar Exercises Modified cat/camel x15 reps      Lumbar Exercises: Seated   Long Arc Quad on Chair AROM;Both;15 reps    LAQ on Chair Limitations on dynadisc    Hip Flexion on Ball AROM;Both;15 reps    Hip Flexion on Ball Limitations on dynadisc      Lumbar Exercises: Supine   Bridge 15 reps;3 seconds                         PT Long Term Goals - 10/23/20 1203       PT LONG TERM GOAL #1   Title Independent with a HEP.    Time 4    Period Weeks    Status New  PT LONG TERM GOAL #2   Title Perform ADL's with pain not > 2/10.    Time 4    Period Weeks    Status New      PT LONG TERM GOAL #3   Title Return to work.    Time 4    Period Weeks    Status New                   Plan - 11/19/20 0956     Clinical Impression Statement Patient presented in clinic with reports of minimal burning in lumbar region. 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 Lumbar stability completed in sitting.    Personal Factors and Comorbidities Comorbidity 1;Other    Comorbidities HTN, Cholecystectomy.    Examination-Activity Limitations Other    Examination-Participation Restrictions Other    Stability/Clinical Decision Making Stable/Uncomplicated    Rehab Potential Excellent    PT Frequency 3x / week    PT Duration 4 weeks    PT Treatment/Interventions ADLs/Self Care Home Management;Cryotherapy;Electrical Stimulation;Ultrasound;Moist Heat;Functional mobility training;Therapeutic activities;Therapeutic exercise;Manual techniques;Patient/family education;Passive range of motion    PT Next Visit Plan Progress per lumbar discectomy protocol.  STW/M  and modalites as needed.    Consulted and Agree with Plan of Care Patient             Patient will benefit from skilled therapeutic intervention in order to improve the following deficits and impairments:  Decreased activity tolerance, Pain  Visit Diagnosis: Acute left-sided low back pain without sciatica     Problem List Patient Active Problem List   Diagnosis Date Noted   Prediabetes 05/13/2020   Obesity (BMI 35.0-39.9 without comorbidity) 08/27/2016   Essential hypertension 06/12/2014   GERD (gastroesophageal reflux disease) 06/12/2014    Standley Brooking, PTA 11/19/2020, 10:45 AM  [facility] [place], [place], [address] Phone: [phone]   Fax:  [phone]  Name: Jackie Ayala MRN: [id number] Date of Birth: 11/27/1960

## 2020-11-21 ENCOUNTER — Ambulatory Visit: Payer: 59 | Attending: Orthopedic Surgery | Admitting: *Deleted

## 2020-11-21 ENCOUNTER — Other Ambulatory Visit: Payer: Self-pay

## 2020-11-21 DIAGNOSIS — M545 Low back pain, unspecified: Secondary | ICD-10-CM | POA: Diagnosis present

## 2020-11-21 NOTE — Therapy (Signed)
[facility] [place], [place], [address] Phone: [phone]   Fax:  [phone]  Physical Therapy Treatment  Patient Details  Name: Jackie Ayala MRN: [id number] Date of Birth: [birth year] Referring Provider (PT): Melina Schools MD   Encounter Date: 11/21/2020   PT End of Session - 11/21/20 0907     Visit Number 10    Number of Visits 12    Date for PT Re-Evaluation 11/20/20    PT Start Time 0900    PT Stop Time [id number]    PT Time Calculation (min) 47 min             Past Medical History:  Diagnosis Date   Hypertension     Past Surgical History:  Procedure Laterality Date   ABDOMINAL HYSTERECTOMY  2004   ovaries remain   CHOLECYSTECTOMY      There were no vitals filed for this visit.   Subjective Assessment - 11/21/20 0903     Subjective COVID-19 screen performed prior to patient entering clinic.  Gets small sticks of discomfort and burning in low back when helping my husband, but then able to rest and it winds down. 2/10    Pertinent History HTN, Cholecystectomy.    Patient Stated Goals Get back to a regular happy life.    Currently in Pain? Yes    Pain Score 2     Pain Location Back    Pain Orientation Lower    Pain Descriptors / Indicators Burning    Pain Type Surgical pain                               [facility] Adult PT Treatment/Exercise - 11/21/20 0001       Exercises   Exercises Knee/Hip      Lumbar Exercises: Aerobic   Tread Mill 1.5 mph x15 min    Nustep L4 x10 min      Lumbar Exercises: Standing   Row Both;20 reps   XTS blue 2x10 5 sec hold with AB bracing   Shoulder Extension Both;20 reps   blue XTS with AB bracing   Other Standing Lumbar Exercises Hinge training for short stop position in standing x10 reps    Other Standing Lumbar Exercises AT counter:  Modified bird-dog arm raise x6 hold for 5 sec holds each UE and then LEs x 6 and 5 sec holds each. Bird-dog x6 each  hold 10 secs                         PT Long Term Goals - 10/23/20 1203       PT LONG TERM GOAL #1   Title Independent with a HEP.    Time 4    Period Weeks    Status New      PT LONG TERM GOAL #2   Title Perform ADL's with pain not > 2/10.    Time 4    Period Weeks    Status New      PT LONG TERM GOAL #3   Title Return to work.    Time 4    Period Weeks    Status New                   Plan - 11/21/20 1108     Clinical Impression Statement Pt arrived today doing fairly well and reports minimal LBP this morning, but  has flare-ups when taking care of her husband. Rx focused on core and posture today with progression of bird- dog at counter. Pt mainly sore/ fatigued end of session.    Personal Factors and Comorbidities Comorbidity 1;Other    Comorbidities HTN, Cholecystectomy.    Examination-Participation Restrictions Other    Stability/Clinical Decision Making Stable/Uncomplicated    Rehab Potential Excellent    PT Frequency 3x / week    PT Duration 4 weeks    PT Treatment/Interventions ADLs/Self Care Home Management;Cryotherapy;Electrical Stimulation;Ultrasound;Moist Heat;Functional mobility training;Therapeutic activities;Therapeutic exercise;Manual techniques;Patient/family education;Passive range of motion    PT Next Visit Plan Progress per lumbar discectomy protocol.  STW/M and modalites as needed.    Consulted and Agree with Plan of Care Patient             Patient will benefit from skilled therapeutic intervention in order to improve the following deficits and impairments:  Decreased activity tolerance, Pain  Visit Diagnosis: Acute left-sided low back pain without sciatica     Problem List Patient Active Problem List   Diagnosis Date Noted   Prediabetes 05/13/2020   Obesity (BMI 35.0-39.9 without comorbidity) 08/27/2016   Essential hypertension 06/12/2014   GERD (gastroesophageal reflux disease) 06/12/2014    Adely Facer,CHRIS,  PTA 11/21/2020, 11:12 AM  [facility] [address] [place], [place], [address] Phone: [phone]   Fax:  [phone]  Name: Jackie Ayala MRN: [id number] Date of Birth: [birth year]

## 2020-11-26 ENCOUNTER — Other Ambulatory Visit: Payer: Self-pay

## 2020-11-26 ENCOUNTER — Ambulatory Visit: Payer: 59 | Admitting: Physical Therapy

## 2020-11-26 DIAGNOSIS — M545 Low back pain, unspecified: Secondary | ICD-10-CM | POA: Diagnosis not present

## 2020-11-26 NOTE — Therapy (Signed)
[facility] [place], [place], [address] Phone: [phone]   Fax:  [phone]  Physical Therapy Treatment  Patient Details  Name: Jackie Ayala MRN: [id number] Date of Birth: [birth year] Referring Provider (PT): Melina Schools MD   Encounter Date: 11/26/2020   PT End of Session - 11/26/20 1328     Visit Number 11    Number of Visits 12    Date for PT Re-Evaluation 12/03/20    PT Start Time 0100    PT Stop Time 0143    PT Time Calculation (min) 43 min    Activity Tolerance Patient tolerated treatment well    Behavior During Therapy [facility] for tasks assessed/performed             Past Medical History:  Diagnosis Date   Hypertension     Past Surgical History:  Procedure Laterality Date   ABDOMINAL HYSTERECTOMY  2004   ovaries remain   CHOLECYSTECTOMY      There were no vitals filed for this visit.   Subjective Assessment - 11/26/20 1328     Subjective COVID-19 screen performed prior to patient entering clinic.  Pain a 1 today.    Pertinent History HTN, Cholecystectomy.    Patient Stated Goals Get back to a regular happy life.    Currently in Pain? Yes    Pain Score 1     Pain Type Surgical pain                               [facility] Adult PT Treatment/Exercise - 11/26/20 0001       Exercises   Exercises Knee/Hip      Lumbar Exercises: Aerobic   Tread Mill 1.5 x 10 minutes.    UBE (Upper Arm Bike) Standing UBE with draw-in 8 minutes (4 minutes forward and 4 minutes).    Nustep Level 4 x 12 minutes.      Lumbar Exercises: Standing   Other Standing Lumbar Exercises Blue XTS resisted walking (4-way):  8 minutes total                         PT Long Term Goals - 10/23/20 1203       PT LONG TERM GOAL #1   Title Independent with a HEP.    Time 4    Period Weeks    Status New      PT LONG TERM GOAL #2   Title Perform ADL's with pain not > 2/10.    Time 4     Period Weeks    Status New      PT LONG TERM GOAL #3   Title Return to work.    Time 4    Period Weeks    Status New                   Plan - 11/26/20 1346     Clinical Impression Statement Excellent job today with the addition of standing UBE and resisted walking with blue XTS band for neuro re-education.  No complaints.    Personal Factors and Comorbidities Comorbidity 1;Other    Comorbidities HTN, Cholecystectomy.    Examination-Activity Limitations Other    Examination-Participation Restrictions Other    Stability/Clinical Decision Making Stable/Uncomplicated    Rehab Potential Excellent    PT Frequency 3x / week    PT Duration 4 weeks    PT  Treatment/Interventions ADLs/Self Care Home Management;Cryotherapy;Electrical Stimulation;Ultrasound;Moist Heat;Functional mobility training;Therapeutic activities;Therapeutic exercise;Manual techniques;Patient/family education;Passive range of motion    PT Next Visit Plan Progress per lumbar discectomy protocol.  STW/M and modalites as needed.    Consulted and Agree with Plan of Care Patient             Patient will benefit from skilled therapeutic intervention in order to improve the following deficits and impairments:  Decreased activity tolerance, Pain  Visit Diagnosis: Acute left-sided low back pain without sciatica - Plan: PT plan of care cert/re-cert     Problem List Patient Active Problem List   Diagnosis Date Noted   Prediabetes 05/13/2020   Obesity (BMI 35.0-39.9 without comorbidity) 08/27/2016   Essential hypertension 06/12/2014   GERD (gastroesophageal reflux disease) 06/12/2014    Keyton Bhat, [place] MPT 11/26/2020, 1:52 PM  [facility] [address] [place], [place], [address] Phone: [phone]   Fax:  [phone]  Name: Keron Erkkila MRN: [id number] Date of Birth: 11/27/1960

## 2020-11-28 ENCOUNTER — Ambulatory Visit: Payer: 59 | Admitting: *Deleted

## 2020-11-28 ENCOUNTER — Other Ambulatory Visit: Payer: Self-pay

## 2020-11-28 DIAGNOSIS — M545 Low back pain, unspecified: Secondary | ICD-10-CM

## 2020-11-28 NOTE — Therapy (Signed)
[facility] [place], [place], [address] Phone: [phone]   Fax:  [phone]  Physical Therapy Treatment  Patient Details  Name: Jackie Ayala MRN: [id number] Date of Birth: [birth year] Referring Provider (PT): Melina Schools MD   Encounter Date: 11/28/2020   PT End of Session - 11/28/20 0923     Visit Number 12    Number of Visits 12    Date for PT Re-Evaluation 12/03/20    PT Start Time 0900    PT Stop Time 0949    PT Time Calculation (min) 49 min             Past Medical History:  Diagnosis Date   Hypertension     Past Surgical History:  Procedure Laterality Date   ABDOMINAL HYSTERECTOMY  2004   ovaries remain   CHOLECYSTECTOMY      There were no vitals filed for this visit.   Subjective Assessment - 11/28/20 0921     Subjective COVID-19 screen performed prior to patient entering clinic. Doing OK today. To MD 01-01-21    Pertinent History HTN, Cholecystectomy.    Patient Stated Goals Get back to a regular happy life.    Currently in Pain? Yes    Pain Score 2     Pain Location Back    Pain Orientation Lower    Pain Descriptors / Indicators Burning    Pain Type Surgical pain    Pain Onset More than a month ago                               [facility] Adult PT Treatment/Exercise - 11/28/20 0001       Bed Mobility   Bed Mobility --   with focus on hinge bend and glute activation     Exercises   Exercises Knee/Hip      Lumbar Exercises: Aerobic   Nustep Level 4 x 15 minutes.      Lumbar Exercises: Standing   Row Both;20 reps   XTS blue 2x10 5 sec hold with AB bracing   Shoulder Extension Both;20 reps   blue XTS with AB bracing   Other Standing Lumbar Exercises AT counter:   Bird-dog x6 each hold 10 secs, x4 hold 10 secs      Lumbar Exercises: Quadruped   Madcat/Old Horse 20 reps   2x10                         PT Long Term Goals - 10/23/20 1203        PT LONG TERM GOAL #1   Title Independent with a HEP.    Time 4    Period Weeks    Status New      PT LONG TERM GOAL #2   Title Perform ADL's with pain not > 2/10.    Time 4    Period Weeks    Status New      PT LONG TERM GOAL #3   Title Return to work.    Time 4    Period Weeks    Status New                   Plan - 11/28/20 1002     Clinical Impression Statement Pt arrived today doing fairly well, but with some pain and stiffness in LB. Rx continues to focus on hinge bending and core exs and  avoiding pain triggers.    Personal Factors and Comorbidities Comorbidity 1;Other    Comorbidities HTN, Cholecystectomy.    Examination-Activity Limitations Other    Stability/Clinical Decision Making Stable/Uncomplicated    Rehab Potential Excellent    PT Frequency 3x / week    PT Duration 4 weeks    PT Treatment/Interventions ADLs/Self Care Home Management;Cryotherapy;Electrical Stimulation;Ultrasound;Moist Heat;Functional mobility training;Therapeutic activities;Therapeutic exercise;Manual techniques;Patient/family education;Passive range of motion    PT Next Visit Plan Progress per lumbar discectomy protocol.  STW/M and modalites as needed.             Patient will benefit from skilled therapeutic intervention in order to improve the following deficits and impairments:  Decreased activity tolerance, Pain  Visit Diagnosis: Acute left-sided low back pain without sciatica     Problem List Patient Active Problem List   Diagnosis Date Noted   Prediabetes 05/13/2020   Obesity (BMI 35.0-39.9 without comorbidity) 08/27/2016   Essential hypertension 06/12/2014   GERD (gastroesophageal reflux disease) 06/12/2014    Teryn Gust,CHRIS, PTA 11/28/2020, 10:07 AM  [facility] [address] [place], [place], [address] Phone: [phone]   Fax:  [phone]  Name: Jackie Ayala MRN: [id number] Date of Birth: 19-Aug-1960

## 2020-11-28 NOTE — Therapy (Signed)
[facility] [place], [place], [address] Phone: [phone]   Fax:  [phone]  Physical Therapy Treatment  Patient Details  Name: Jackie Ayala MRN: [id number] Date of Birth: 02-13-1961 Referring Provider (PT): Melina Schools MD   Encounter Date: 11/28/2020   PT End of Session - 11/28/20 0923     Visit Number 12    Number of Visits 12    Date for PT Re-Evaluation 12/03/20    PT Start Time 0900    PT Stop Time 0949    PT Time Calculation (min) 49 min             Past Medical History:  Diagnosis Date   Hypertension     Past Surgical History:  Procedure Laterality Date   ABDOMINAL HYSTERECTOMY  2004   ovaries remain   CHOLECYSTECTOMY      There were no vitals filed for this visit.   Subjective Assessment - 11/28/20 0921     Subjective COVID-19 screen performed prior to patient entering clinic. Doing OK today. To MD 01-01-21    Pertinent History HTN, Cholecystectomy.    Patient Stated Goals Get back to a regular happy life.    Currently in Pain? Yes    Pain Score 2     Pain Location Back    Pain Orientation Lower    Pain Descriptors / Indicators Burning    Pain Type Surgical pain    Pain Onset More than a month ago                               [facility] Adult PT Treatment/Exercise - 11/28/20 0001       Bed Mobility   Bed Mobility --   with focus on hinge bend and glute activation     Exercises   Exercises Knee/Hip      Lumbar Exercises: Aerobic   Nustep Level 4 x 15 minutes.      Lumbar Exercises: Standing   Row Both;20 reps   XTS blue 2x10 5 sec hold with AB bracing   Shoulder Extension Both;20 reps   blue XTS with AB bracing   Other Standing Lumbar Exercises AT counter:   Bird-dog x6 each hold 10 secs, x4 hold 10 secs      Lumbar Exercises: Quadruped   Madcat/Old Horse 20 reps   2x10                         PT Long Term Goals - 11/28/20 1028        PT LONG TERM GOAL #1   Title Independent with a HEP.    Time 4    Period Weeks    Status Partially Met      PT LONG TERM GOAL #2   Title Perform ADL's with pain not > 2/10.    Time 4    Period Weeks    Status On-going      PT LONG TERM GOAL #3   Title Return to work.    Time 4    Period Weeks    Status On-going                   Plan - 11/28/20 1002     Clinical Impression Statement Pt arrived today doing fairly well, but with some pain and stiffness in LB. Rx continues to focus on hinge bending and core exs  and avoiding pain triggers.ADLs getting easier, but her main challeng is taking care of her husband which requires leaning forward to assist him.    Personal Factors and Comorbidities Comorbidity 1;Other    Comorbidities HTN, Cholecystectomy.    Examination-Activity Limitations Other    Stability/Clinical Decision Making Stable/Uncomplicated    Rehab Potential Excellent    PT Frequency 3x / week    PT Duration 4 weeks    PT Treatment/Interventions ADLs/Self Care Home Management;Cryotherapy;Electrical Stimulation;Ultrasound;Moist Heat;Functional mobility training;Therapeutic activities;Therapeutic exercise;Manual techniques;Patient/family education;Passive range of motion    PT Next Visit Plan Progress per lumbar discectomy protocol.  STW/M and modalites as needed. TO MD on Monday             Patient will benefit from skilled therapeutic intervention in order to improve the following deficits and impairments:  Decreased activity tolerance, Pain  Visit Diagnosis: Acute left-sided low back pain without sciatica     Problem List Patient Active Problem List   Diagnosis Date Noted   Prediabetes 05/13/2020   Obesity (BMI 35.0-39.9 without comorbidity) 08/27/2016   Essential hypertension 06/12/2014   GERD (gastroesophageal reflux disease) 06/12/2014    Bessie Boyte,CHRIS, PTA 11/28/2020, 10:28 AM  [facility] [address] [place], [place], [address] Phone: [phone]   Fax:  [phone]  Name: Kenzi Bardwell MRN: [id number] Date of Birth: 1960/10/25

## 2020-12-03 ENCOUNTER — Other Ambulatory Visit: Payer: Self-pay

## 2020-12-03 ENCOUNTER — Encounter: Payer: Self-pay | Admitting: Family

## 2020-12-03 ENCOUNTER — Ambulatory Visit (INDEPENDENT_AMBULATORY_CARE_PROVIDER_SITE_OTHER): Payer: 59 | Admitting: Family

## 2020-12-03 VITALS — BP 154/81 | HR 67 | Temp 98.1°F | Ht 62.0 in | Wt 193.6 lb

## 2020-12-03 DIAGNOSIS — E669 Obesity, unspecified: Secondary | ICD-10-CM | POA: Diagnosis not present

## 2020-12-03 DIAGNOSIS — I10 Essential (primary) hypertension: Secondary | ICD-10-CM

## 2020-12-03 DIAGNOSIS — K219 Gastro-esophageal reflux disease without esophagitis: Secondary | ICD-10-CM | POA: Diagnosis not present

## 2020-12-03 DIAGNOSIS — E785 Hyperlipidemia, unspecified: Secondary | ICD-10-CM | POA: Diagnosis not present

## 2020-12-03 LAB — CMP14+EGFR
ALT: 12 IU/L (ref 0–32)
AST: 15 IU/L (ref 0–40)
Albumin/Globulin Ratio: 1.5 (ref 1.2–2.2)
Albumin: 4.3 g/dL (ref 3.8–4.8)
Alkaline Phosphatase: 72 IU/L (ref 44–121)
BUN/Creatinine Ratio: 14 (ref 12–28)
BUN: 13 mg/dL (ref 8–27)
Bilirubin Total: 0.4 mg/dL (ref 0.0–1.2)
CO2: 28 mmol/L (ref 20–29)
Calcium: 9.6 mg/dL (ref 8.7–10.3)
Chloride: 104 mmol/L (ref 96–106)
Creatinine, Ser: 0.95 mg/dL (ref 0.57–1.00)
Globulin, Total: 2.8 g/dL (ref 1.5–4.5)
Glucose: 111 mg/dL — ABNORMAL HIGH (ref 65–99)
Potassium: 4.7 mmol/L (ref 3.5–5.2)
Sodium: 141 mmol/L (ref 134–144)
Total Protein: 7.1 g/dL (ref 6.0–8.5)
eGFR: 68 mL/min/{1.73_m2} (ref >59)

## 2020-12-03 MED ORDER — ATENOLOL 50 MG PO TABS: ORAL_TABLET | ORAL | 3 refills | Status: DC

## 2020-12-03 NOTE — Progress Notes (Signed)
Subjective:    Patient ID: Jackie Ayala, female    DOB: July 05, 1960, 60 y.o.   MRN: [id number]   Chief Complaint  Patient presents with   Medical Management of Chronic Issues   Pt presents to the office today for chronic follow up. She is followed by Ortho and had lumbar surgery on 09/06/20. She is still working with PT twice a week.  Hypertension This is a chronic problem. The current episode started more than 1 year ago. The problem has been waxing and waning since onset. The problem is uncontrolled. Associated symptoms include malaise/fatigue and peripheral edema ("a little bit"). Pertinent negatives include no shortness of breath. Risk factors for coronary artery disease include dyslipidemia and obesity. The current treatment provides moderate improvement.  Gastroesophageal Reflux She complains of belching and heartburn. This is a chronic problem. The current episode started more than 1 year ago. The problem occurs occasionally. Risk factors include obesity. She has tried a PPI for the symptoms.  Hyperlipidemia This is a chronic problem. The current episode started more than 1 year ago. Exacerbating diseases include obesity. Pertinent negatives include no shortness of breath. Current antihyperlipidemic treatment includes statins. The current treatment provides moderate improvement of lipids. Risk factors for coronary artery disease include dyslipidemia, hypertension, a sedentary lifestyle and post-menopausal.     Review of Systems  Constitutional:  Positive for malaise/fatigue.  Respiratory:  Negative for shortness of breath.   Gastrointestinal:  Positive for heartburn.  All other systems reviewed and are negative.     Objective:   Physical Exam Vitals reviewed.  Constitutional:      General: She is not in acute distress.    Appearance: She is well-developed. She is obese.  HENT:     Head: Normocephalic and atraumatic.     Right Ear: Tympanic membrane normal.     Left Ear:  Tympanic membrane normal.  Eyes:     Pupils: Pupils are equal, round, and reactive to light.  Neck:     Thyroid: No thyromegaly.  Cardiovascular:     Rate and Rhythm: Normal rate and regular rhythm.     Heart sounds: Normal heart sounds. No murmur heard. Pulmonary:     Effort: Pulmonary effort is normal. No respiratory distress.     Breath sounds: Normal breath sounds. No wheezing.  Abdominal:     General: Bowel sounds are normal. There is no distension.     Palpations: Abdomen is soft.     Tenderness: There is no abdominal tenderness.  Musculoskeletal:        General: No tenderness. Normal range of motion.     Cervical back: Normal range of motion and neck supple.  Skin:    General: Skin is warm and dry.  Neurological:     Mental Status: She is alert and oriented to person, place, and time.     Cranial Nerves: No cranial nerve deficit.     Deep Tendon Reflexes: Reflexes are normal and symmetric.  Psychiatric:        Behavior: Behavior normal.        Thought Content: Thought content normal.        Judgment: Judgment normal.         BP (!) 154/81   Pulse 67   Temp 98.1 F (36.7 C) (Temporal)   Ht 5' 2"  (1.575 m)   Wt 193 lb 9.6 oz (87.8 kg)   BMI 35.41 kg/m   Assessment & Plan:   Jackie Ayala comes in today  with chief complaint of Medical Management of Chronic Issues   Diagnosis and orders addressed:  1. Essential hypertension Will bring back and recheck in 3 months, if still elevated will need to increase medications.  -Dash diet information given -Exercise encouraged - Stress Management  -Continue current meds - atenolol (TENORMIN) 50 MG tablet; One tab by mouth every day. Needs appointment for future refills.  Dispense: 90 tablet; Refill: 3 - CMP14+EGFR  2. Gastroesophageal reflux disease, unspecified whether esophagitis present - CMP14+EGFR  3. Obesity (BMI 35.0-39.9 without comorbidity) - CMP14+EGFR  4. Hyperlipidemia, unspecified hyperlipidemia  type - CMP14+EGFR   Labs pending Health Maintenance reviewed Diet and exercise encouraged  Follow up plan: 3 months    Evelina Dun, FNP

## 2020-12-03 NOTE — Patient Instructions (Signed)
Health Maintenance, Female Adopting a healthy lifestyle and getting preventive care are important in promoting health and wellness. Ask your health care provider about: The right schedule for you to have regular tests and exams. Things you can do on your own to prevent diseases and keep yourself healthy. What should I know about diet, weight, and exercise? Eat a healthy diet  Eat a diet that includes plenty of vegetables, fruits, low-fat dairy products, and lean protein. Do not eat a lot of foods that are high in solid fats, added sugars, or sodium. Maintain a healthy weight Body mass index (BMI) is used to identify weight problems. It estimates body fat based on height and weight. Your health care provider can help determine your BMI and help you achieve or maintain a healthy weight. Get regular exercise Get regular exercise. This is one of the most important things you can do for your health. Most adults should: Exercise for at least 150 minutes each week. The exercise should increase your heart rate and make you sweat (moderate-intensity exercise). Do strengthening exercises at least twice a week. This is in addition to the moderate-intensity exercise. Spend less time sitting. Even light physical activity can be beneficial. Watch cholesterol and blood lipids Have your blood tested for lipids and cholesterol at 62 years of age, then have this test every 5 years. Have your cholesterol levels checked more often if: Your lipid or cholesterol levels are high. You are older than 62 years of age. You are at high risk for heart disease. What should I know about cancer screening? Depending on your health history and family history, you may need to have cancer screening at various ages. This may include screening for: Breast cancer. Cervical cancer. Colorectal cancer. Skin cancer. Lung cancer. What should I know about heart disease, diabetes, and high blood pressure? Blood pressure and heart  disease High blood pressure causes heart disease and increases the risk of stroke. This is more likely to develop in people who have high blood pressure readings, are of African descent, or are overweight. Have your blood pressure checked: Every 3-5 years if you are 18-39 years of age. Every year if you are 40 years old or older. Diabetes Have regular diabetes screenings. This checks your fasting blood sugar level. Have the screening done: Once every three years after age 40 if you are at a normal weight and have a low risk for diabetes. More often and at a younger age if you are overweight or have a high risk for diabetes. What should I know about preventing infection? Hepatitis B If you have a higher risk for hepatitis B, you should be screened for this virus. Talk with your health care provider to find out if you are at risk for hepatitis B infection. Hepatitis C Testing is recommended for: Everyone born from 1945 through 1965. Anyone with known risk factors for hepatitis C. Sexually transmitted infections (STIs) Get screened for STIs, including gonorrhea and chlamydia, if: You are sexually active and are younger than 62 years of age. You are older than 62 years of age and your health care provider tells you that you are at risk for this type of infection. Your sexual activity has changed since you were last screened, and you are at increased risk for chlamydia or gonorrhea. Ask your health care provider if you are at risk. Ask your health care provider about whether you are at high risk for HIV. Your health care provider may recommend a prescription medicine   to help prevent HIV infection. If you choose to take medicine to prevent HIV, you should first get tested for HIV. You should then be tested every 3 months for as long as you are taking the medicine. Pregnancy If you are about to stop having your period (premenopausal) and you may become pregnant, seek counseling before you get  pregnant. Take 400 to 800 micrograms (mcg) of folic acid every day if you become pregnant. Ask for birth control (contraception) if you want to prevent pregnancy. Osteoporosis and menopause Osteoporosis is a disease in which the bones lose minerals and strength with aging. This can result in bone fractures. If you are 65 years old or older, or if you are at risk for osteoporosis and fractures, ask your health care provider if you should: Be screened for bone loss. Take a calcium or vitamin D supplement to lower your risk of fractures. Be given hormone replacement therapy (HRT) to treat symptoms of menopause. Follow these instructions at home: Lifestyle Do not use any products that contain nicotine or tobacco, such as cigarettes, e-cigarettes, and chewing tobacco. If you need help quitting, ask your health care provider. Do not use street drugs. Do not share needles. Ask your health care provider for help if you need support or information about quitting drugs. Alcohol use Do not drink alcohol if: Your health care provider tells you not to drink. You are pregnant, may be pregnant, or are planning to become pregnant. If you drink alcohol: Limit how much you use to 0-1 drink a day. Limit intake if you are breastfeeding. Be aware of how much alcohol is in your drink. In the [place] one drink equals one 12 oz bottle of beer (355 mL), one 5 oz glass of wine (148 mL), or one 1 oz glass of hard liquor (44 mL). General instructions Schedule regular health, dental, and eye exams. Stay current with your vaccines. Tell your health care provider if: You often feel depressed. You have ever been abused or do not feel safe at home. Summary Adopting a healthy lifestyle and getting preventive care are important in promoting health and wellness. Follow your health care provider's instructions about healthy diet, exercising, and getting tested or screened for diseases. Follow your health care provider's  instructions on monitoring your cholesterol and blood pressure. This information is not intended to replace advice given to you by your health care provider. Make sure you discuss any questions you have with your health care provider. Document Revised: 05/17/2020 Document Reviewed: 03/02/2018 Elsevier Patient Education  2022 [employer] Inc[employer]  

## 2020-12-04 ENCOUNTER — Ambulatory Visit: Payer: 59 | Admitting: Physical Therapy

## 2020-12-04 DIAGNOSIS — M545 Low back pain, unspecified: Secondary | ICD-10-CM | POA: Diagnosis not present

## 2020-12-04 NOTE — Patient Instructions (Signed)
Half Squat to Chair   Stand with feet shoulder width apart. Push buttocks backward and lower slowly, sitting in chair lightly and returning to standing position. Squeeze gluts with standing  Complete _2_ sets of 10_ repetitions. Perform __2-3_ sessions per day.   Scapular Retraction: Bilateral  Facing anchor, pull arms back, bringing shoulder blades together. Repeat _30___ times per set. Do __2-3__ sets per session. Do _2___ sessions per day.    Standing lat pull with theraband  Anchor bands higher than your head. Start with your arms straight out in front of you at shoulder height (or a little above).  Pull bands down next to your body and then slowly return to the starting position.  10-30 x1day    Bridging with Theraband  Begin by lying on your back with your knees bent and theraband around your knees. Tighten your abs by tilting your pelvis up and flattening your back on the mat. Squeeze your glutes and raise your hips off of the table towards the ceiling. Keeping your hips raised, pull your knees outward against the band. Relax your knees back to midline and slowly return your hips to the mat. Relax and Breathe 2x10 1x daily

## 2020-12-04 NOTE — Therapy (Signed)
[facility] [place], [place], [address] Phone: [phone]   Fax:  [phone]  Physical Therapy Treatment  Patient Details  Name: Jackie Ayala MRN: [id number] Date of Birth: 1960/08/29 Referring Provider (PT): Melina Schools MD   Encounter Date: 12/04/2020   PT End of Session - 12/04/20 1031     Visit Number 13    Number of Visits 24    Date for PT Re-Evaluation 01/01/21    PT Start Time 1030    PT Stop Time 1112    PT Time Calculation (min) 42 min    Activity Tolerance Patient tolerated treatment well    Behavior During Therapy [facility] for tasks assessed/performed             Past Medical History:  Diagnosis Date   Hypertension     Past Surgical History:  Procedure Laterality Date   ABDOMINAL HYSTERECTOMY  2004   ovaries remain   CHOLECYSTECTOMY      There were no vitals filed for this visit.   Subjective Assessment - 12/04/20 1035     Subjective COVID-19 screen performed prior to patient entering clinic. Patient arrived doing well and has new order to continue therpy. Patient feels overall improved yet not were she needs to be to complete work tasks.    Pertinent History HTN, Cholecystectomy.    Patient Stated Goals Get back to a regular happy life.    Currently in Pain? Yes    Pain Score 2     Pain Location Back    Pain Orientation Lower    Pain Descriptors / Indicators Burning    Pain Type Surgical pain    Pain Onset More than a month ago    Pain Frequency Constant    Aggravating Factors  certain movements    Pain Relieving Factors rest                               [facility] Adult PT Treatment/Exercise - 12/04/20 0001       Lumbar Exercises: Aerobic   Nustep Level 4 x 16 min UE/LE      Lumbar Exercises: Standing   Row Both;20 reps    Row Limitations Blue XTS    Shoulder Extension Both;20 reps   BLUE XTS   Shoulder Extension Limitations Blue XTS    Other Standing Lumbar  Exercises 2# step outs bil 2x10, diagnols 2# 2x10 Half range on both exercises    Other Standing Lumbar Exercises blue swiss ball for core press standing x20                     PT Education - 12/04/20 1112     Education Details HEP progression    Person(s) Educated Patient    Methods Explanation;Demonstration;Handout    Comprehension Verbalized understanding;Returned demonstration                 PT Long Term Goals - 12/04/20 1042       PT LONG TERM GOAL #1   Title Independent with a HEP.    Baseline Met 12/04/20    Time 4    Period Weeks    Status Achieved      PT LONG TERM GOAL #2   Title Perform ADL's with pain not > 2/10.    Baseline limited with some ADL's 12/04/20    Time 4    Period Weeks  Status On-going      PT LONG TERM GOAL #3   Title Return to work.    Baseline Not back to work yet    Time 4    Period Weeks    Status On-going                   Plan - 12/04/20 1055     Clinical Impression Statement Patient tolerated treatment well today. patient has new order to cont per MD. Today focused on functional core progression to improve strength and mobility. Patient will require reaching and UE activity while standing with work activities.  HEP provided for home progression. Met LTG #1 today with other goals progressing. Patient has not returned to work at tis time.    Personal Factors and Comorbidities Comorbidity 1;Other    Comorbidities HTN, Cholecystectomy.    Examination-Activity Limitations Other    Examination-Participation Restrictions Other    Stability/Clinical Decision Making Stable/Uncomplicated    Rehab Potential Excellent    PT Frequency 3x / week    PT Duration 4 weeks    PT Treatment/Interventions ADLs/Self Care Home Management;Cryotherapy;Electrical Stimulation;Ultrasound;Moist Heat;Functional mobility training;Therapeutic activities;Therapeutic exercise;Manual techniques;Patient/family education;Passive range of  motion    PT Next Visit Plan Progress per lumbar discectomy protocol for core and functional strength to prepare for return to work.    Consulted and Agree with Plan of Care Patient             Patient will benefit from skilled therapeutic intervention in order to improve the following deficits and impairments:  Decreased activity tolerance, Pain  Visit Diagnosis: Acute left-sided low back pain without sciatica     Problem List Patient Active Problem List   Diagnosis Date Noted   Prediabetes 05/13/2020   Obesity (BMI 35.0-39.9 without comorbidity) 08/27/2016   Essential hypertension 06/12/2014   GERD (gastroesophageal reflux disease) 06/12/2014    Liora Myles P, PTA 12/04/2020, 11:16 AM  [facility] [address] [place], [place], [address] Phone: [phone]   Fax:  [phone]  Name: Jackie Ayala MRN: [id number] Date of Birth: [birth year]

## 2020-12-06 ENCOUNTER — Other Ambulatory Visit: Payer: Self-pay

## 2020-12-06 ENCOUNTER — Ambulatory Visit: Payer: 59 | Admitting: *Deleted

## 2020-12-06 DIAGNOSIS — M545 Low back pain, unspecified: Secondary | ICD-10-CM

## 2020-12-06 NOTE — Therapy (Signed)
[facility] [place], [place], [address] Phone: [phone]   Fax:  [phone]  Physical Therapy Treatment  Patient Details  Name: Jackie Ayala MRN: [id number] Date of Birth: [birth year] Referring Provider (PT): Melina Schools MD   Encounter Date: 12/06/2020   PT End of Session - 12/06/20 1038     Visit Number 14    Number of Visits 24    Date for PT Re-Evaluation 01/01/21    PT Start Time 1030    PT Stop Time 1119    PT Time Calculation (min) 49 min             Past Medical History:  Diagnosis Date   Hypertension     Past Surgical History:  Procedure Laterality Date   ABDOMINAL HYSTERECTOMY  2004   ovaries remain   CHOLECYSTECTOMY      There were no vitals filed for this visit.   Subjective Assessment - 12/06/20 1037     Subjective COVID-19 screen performed prior to patient entering clinic. Marland Kitchen Patient feels overall improved yet not were she needs to be to complete work tasks.    Pertinent History HTN, Cholecystectomy.    Patient Stated Goals Get back to a regular happy life.    Currently in Pain? Yes    Pain Score 2     Pain Location Back    Pain Orientation Lower    Pain Descriptors / Indicators Burning    Pain Type Surgical pain    Pain Onset More than a month ago                               [facility] Adult PT Treatment/Exercise - 12/06/20 0001       Exercises   Exercises Knee/Hip      Lumbar Exercises: Aerobic   Tread Mill 1.5 x 10 minutes.    Nustep Level 4 x 10 min UE/LE      Lumbar Exercises: Standing   Row Both;20 reps    Row Limitations Blue XTS    Shoulder Extension Both;20 reps   BLUE XTS   Shoulder Extension Limitations Blue XTS    Other Standing Lumbar Exercises AT counter:   Bird-dog x6 each hold 10 secs, x4 hold 10 secs      Lumbar Exercises: Quadruped   [place] Horse --   2x10                         PT Long Term Goals - 12/04/20  1042       PT LONG TERM GOAL #1   Title Independent with a HEP.    Baseline Met 12/04/20    Time 4    Period Weeks    Status Achieved      PT LONG TERM GOAL #2   Title Perform ADL's with pain not > 2/10.    Baseline limited with some ADL's 12/04/20    Time 4    Period Weeks    Status On-going      PT LONG TERM GOAL #3   Title Return to work.    Baseline Not back to work yet    Time 4    Period Weeks    Status On-going                   Plan - 12/06/20 1039     Clinical Impression Statement Pt  arrived today doing fair with minimal LBP and reports f/u with MD went well. He wants her to cont. with PT progression. Pt was able to perform all exs without brace today with mainly tightness end of session.    Personal Factors and Comorbidities Comorbidity 1;Other    Comorbidities HTN, Cholecystectomy.    Examination-Activity Limitations Other    Examination-Participation Restrictions Other    Stability/Clinical Decision Making Stable/Uncomplicated    Rehab Potential Excellent    PT Frequency 3x / week    PT Duration 4 weeks    PT Treatment/Interventions ADLs/Self Care Home Management;Cryotherapy;Electrical Stimulation;Ultrasound;Moist Heat;Functional mobility training;Therapeutic activities;Therapeutic exercise;Manual techniques;Patient/family education;Passive range of motion    PT Next Visit Plan Progress per lumbar discectomy protocol for core and functional strength to prepare for return to work.    Consulted and Agree with Plan of Care Patient             Patient will benefit from skilled therapeutic intervention in order to improve the following deficits and impairments:  Decreased activity tolerance, Pain  Visit Diagnosis: Acute left-sided low back pain without sciatica     Problem List Patient Active Problem List   Diagnosis Date Noted   Prediabetes 05/13/2020   Obesity (BMI 35.0-39.9 without comorbidity) 08/27/2016   Essential hypertension 06/12/2014    GERD (gastroesophageal reflux disease) 06/12/2014    Criag Wicklund,CHRIS, PTA 12/06/2020, 12:29 PM  [facility] [address] [place], [place], [address] Phone: [phone]   Fax:  [phone]  Name: Jackie Ayala MRN: [id number] Date of Birth: 01-14-1961

## 2020-12-12 ENCOUNTER — Other Ambulatory Visit: Payer: Self-pay

## 2020-12-12 ENCOUNTER — Encounter: Payer: Self-pay | Admitting: Physical Therapy

## 2020-12-12 ENCOUNTER — Ambulatory Visit: Payer: 59 | Admitting: Physical Therapy

## 2020-12-12 DIAGNOSIS — M545 Low back pain, unspecified: Secondary | ICD-10-CM

## 2020-12-12 NOTE — Therapy (Signed)
[facility] [place], [place], [address] Phone: [phone]   Fax:  [phone]  Physical Therapy Treatment  Patient Details  Name: Jackie Ayala MRN: [id number] Date of Birth: 1960-08-25 Referring Provider (PT): Melina Schools MD   Encounter Date: 12/12/2020   PT End of Session - 12/12/20 0907     Visit Number 15    Number of Visits 24    Date for PT Re-Evaluation 01/01/21    PT Start Time 0901    PT Stop Time 0945    PT Time Calculation (min) 44 min    Activity Tolerance Patient tolerated treatment well    Behavior During Therapy [facility] for tasks assessed/performed             Past Medical History:  Diagnosis Date   Hypertension     Past Surgical History:  Procedure Laterality Date   ABDOMINAL HYSTERECTOMY  2004   ovaries remain   CHOLECYSTECTOMY      There were no vitals filed for this visit.   Subjective Assessment - 12/12/20 0906     Subjective COVID-19 screen performed prior to patient entering clinic. Patient reports that she is weaning from the brace. Burning has greatly improved per patient.    Pertinent History HTN, Cholecystectomy.    Patient Stated Goals Get back to a regular happy life.    Currently in Pain? Yes    Pain Score 2     Pain Location Back    Pain Orientation Lower    Pain Descriptors / Indicators Other (Comment)   heat   Pain Type Surgical pain    Pain Onset More than a month ago    Pain Frequency Constant                [facility] PT Assessment - 12/12/20 0001       Assessment   Medical Diagnosis L3 foraminotomy and discectomy    Referring Provider (PT) Melina Schools MD    Onset Date/Surgical Date 09/06/20                           [facility] Adult PT Treatment/Exercise - 12/12/20 0001       Lumbar Exercises: Aerobic   Tread Mill 1.5 x 15 minutes.    Nustep Level 4 x 15 min UE/LE      Lumbar Exercises: Standing   Row Both;20 reps    Row Limitations  Blue XTS    Shoulder Extension Both;20 reps    Shoulder Extension Limitations Blue XTS    Other Standing Lumbar Exercises wall pushups x20 reps                          PT Long Term Goals - 12/12/20 0915       PT LONG TERM GOAL #1   Title Independent with a HEP.    Baseline Met 12/04/20    Time 4    Period Weeks    Status Achieved      PT LONG TERM GOAL #2   Title Perform ADL's with pain not > 2/10.    Baseline limited with some ADL's 12/04/20    Time 4    Period Weeks    Status Achieved      PT LONG TERM GOAL #3   Title Return to work.    Baseline Not back to work yet    Time 4  Period Weeks    Status Unable to assess   Patient has decided to not return to work                  Plan - 12/12/20 0949     Clinical Impression Statement Patient presented in clinic with "normal" lumbar burning. Patient able to tolerate longer aerobic and standing exercise today without brace. Patient brought brace to clinic and was not donned during therex. Patient has opted to not return to work. Continues to have low level burning of low back with activities but is reported as much improved. No complaints of pain during therex.    Personal Factors and Comorbidities Comorbidity 1;Other    Comorbidities HTN, Cholecystectomy.    Examination-Activity Limitations Other    Examination-Participation Restrictions Other    Stability/Clinical Decision Making Stable/Uncomplicated    Rehab Potential Excellent    PT Frequency 3x / week    PT Duration 4 weeks    PT Treatment/Interventions ADLs/Self Care Home Management;Cryotherapy;Electrical Stimulation;Ultrasound;Moist Heat;Functional mobility training;Therapeutic activities;Therapeutic exercise;Manual techniques;Patient/family education;Passive range of motion    PT Next Visit Plan Progress per lumbar discectomy protocol for core and functional strength to prepare for return to work.    Consulted and Agree with Plan of Care  Patient             Patient will benefit from skilled therapeutic intervention in order to improve the following deficits and impairments:  Decreased activity tolerance, Pain  Visit Diagnosis: Acute left-sided low back pain without sciatica     Problem List Patient Active Problem List   Diagnosis Date Noted   Prediabetes 05/13/2020   Obesity (BMI 35.0-39.9 without comorbidity) 08/27/2016   Essential hypertension 06/12/2014   GERD (gastroesophageal reflux disease) 06/12/2014    Standley Brooking, PTA 12/12/2020, 10:28 AM  [facility] [address] [place], [place], [address] Phone: [phone]   Fax:  [phone]  Name: Jackie Ayala MRN: [id number] Date of Birth: April 27, 1960

## 2020-12-13 ENCOUNTER — Ambulatory Visit: Payer: 59 | Admitting: *Deleted

## 2020-12-13 ENCOUNTER — Other Ambulatory Visit: Payer: Self-pay

## 2020-12-13 DIAGNOSIS — M545 Low back pain, unspecified: Secondary | ICD-10-CM

## 2020-12-13 NOTE — Therapy (Signed)
[facility] [place], [place], [address] Phone: [phone]   Fax:  [phone]  Physical Therapy Treatment  Patient Details  Name: Jackie Ayala MRN: [id number] Date of Birth: [birth year] Referring Provider (PT): Melina Schools MD   Encounter Date: 12/13/2020   PT End of Session - 12/13/20 1030     Visit Number 17    Number of Visits 24    Date for PT Re-Evaluation 01/01/21    PT Start Time 0945    PT Stop Time 1031    PT Time Calculation (min) 46 min             Past Medical History:  Diagnosis Date   Hypertension     Past Surgical History:  Procedure Laterality Date   ABDOMINAL HYSTERECTOMY  2004   ovaries remain   CHOLECYSTECTOMY      There were no vitals filed for this visit.   Subjective Assessment - 12/13/20 1012     Subjective COVID-19 screen performed prior to patient entering clinic. Patient reports doing better each day    Pertinent History HTN, Cholecystectomy.    Patient Stated Goals Get back to a regular happy life.    Pain Score 2     Pain Location Back    Pain Orientation Lower    Pain Descriptors / Indicators Sore    Pain Type Surgical pain                               [facility] Adult PT Treatment/Exercise - 12/13/20 0001       Lumbar Exercises: Aerobic   Tread Mill 1.5 x 10 minutes.    Nustep Level 4 x 10 min UE/LE      Lumbar Exercises: Standing   Row Both;20 reps   hold 5 secs   Row Limitations Blue XTS    Shoulder Extension Both;20 reps   hold 5 secs   Shoulder Extension Limitations Blue XTS    Other Standing Lumbar Exercises wall pushups x20 reps    Other Standing Lumbar Exercises AT counter:   Bird-dog x6 each hold 10 secs, x4 hold 10 secs, x2 hold 10 secs                          PT Long Term Goals - 12/12/20 0915       PT LONG TERM GOAL #1   Title Independent with a HEP.    Baseline Met 12/04/20    Time 4    Period Weeks     Status Achieved      PT LONG TERM GOAL #2   Title Perform ADL's with pain not > 2/10.    Baseline limited with some ADL's 12/04/20    Time 4    Period Weeks    Status Achieved      PT LONG TERM GOAL #3   Title Return to work.    Baseline Not back to work yet    Time 4    Period Weeks    Status Unable to assess   Patient has decided to not return to work                  Plan - 12/13/20 1037     Clinical Impression Statement Pt arrived today in good spirits and was able to continue with progression of core exs with mainly mm tightness end of  session. Pt reports she continues to wean from brace.    Personal Factors and Comorbidities Comorbidity 1;Other    Comorbidities HTN, Cholecystectomy.    Examination-Participation Restrictions Other    Stability/Clinical Decision Making Stable/Uncomplicated    Rehab Potential Excellent    PT Frequency 3x / week    PT Duration 4 weeks    PT Treatment/Interventions ADLs/Self Care Home Management;Cryotherapy;Electrical Stimulation;Ultrasound;Moist Heat;Functional mobility training;Therapeutic activities;Therapeutic exercise;Manual techniques;Patient/family education;Passive range of motion    PT Next Visit Plan Progress per lumbar discectomy protocol for core and functional strength to prepare for return to work.             Patient will benefit from skilled therapeutic intervention in order to improve the following deficits and impairments:  Decreased activity tolerance, Pain  Visit Diagnosis: Acute left-sided low back pain without sciatica     Problem List Patient Active Problem List   Diagnosis Date Noted   Prediabetes 05/13/2020   Obesity (BMI 35.0-39.9 without comorbidity) 08/27/2016   Essential hypertension 06/12/2014   GERD (gastroesophageal reflux disease) 06/12/2014    Jackie Ayala,Jackie Ayala, Jackie Ayala 12/13/2020, 10:40 AM  [facility] [address] [place], [place],  [address] Phone: [phone]   Fax:  [phone]  Name: Jackie Ayala MRN: [id number] Date of Birth: 08/19/1960

## 2020-12-17 ENCOUNTER — Ambulatory Visit: Payer: 59 | Admitting: *Deleted

## 2020-12-17 ENCOUNTER — Other Ambulatory Visit: Payer: Self-pay

## 2020-12-17 DIAGNOSIS — M545 Low back pain, unspecified: Secondary | ICD-10-CM | POA: Diagnosis not present

## 2020-12-17 NOTE — Therapy (Signed)
[facility] [place], [place], [address] Phone: [phone]   Fax:  [phone]  Physical Therapy Treatment  Patient Details  Name: Jackie Ayala MRN: [id number] Date of Birth: May 04, 1960 Referring Provider (PT): Melina Schools MD   Encounter Date: 12/17/2020   PT End of Session - 12/17/20 0910     Visit Number 18    Number of Visits 24    Date for PT Re-Evaluation 01/01/21    PT Start Time 0900    PT Stop Time 0945    PT Time Calculation (min) 45 min             Past Medical History:  Diagnosis Date   Hypertension     Past Surgical History:  Procedure Laterality Date   ABDOMINAL HYSTERECTOMY  2004   ovaries remain   CHOLECYSTECTOMY      There were no vitals filed for this visit.   Subjective Assessment - 12/17/20 0909     Subjective COVID-19 screen performed prior to patient entering clinic. Patient reports doing better each day. Did good after last Rx    Pertinent History HTN, Cholecystectomy.    Patient Stated Goals Get back to a regular happy life.    Currently in Pain? Yes    Pain Score 2     Pain Location Back    Pain Orientation Lower    Pain Descriptors / Indicators Sore    Pain Type Surgical pain    Pain Onset More than a month ago                               [facility] Adult PT Treatment/Exercise - 12/17/20 0001       Lumbar Exercises: Aerobic   Tread Mill 1.5 x 10 minutes.    Nustep Level 4 x 15 min UE/LE      Lumbar Exercises: Standing   Row Both;20 reps   hold 5 secs   Row Limitations Blue XTS    Shoulder Extension Both;20 reps   hold 5 secs   Shoulder Extension Limitations Blue XTS    Other Standing Lumbar Exercises wall pushups x20 reps    Other Standing Lumbar Exercises AT counter:   Bird-dog x6 each hold 10 secs, x4 hold 10 secs, x2 hold 10 secs                          PT Long Term Goals - 12/17/20 0933       PT LONG TERM GOAL #1    Title Independent with a HEP.    Time 4    Period Weeks    Status Achieved      PT LONG TERM GOAL #2   Title Perform ADL's with pain not > 2/10.    Time 4    Period Weeks    Status Achieved      PT LONG TERM GOAL #3   Title Return to work.    Time 4    Period Weeks    Status On-going                   Plan - 12/17/20 0935     Clinical Impression Statement Pt arrived today doing fairly well overall and reports ADLsare easier/ better, but some days are harder than others taking care her husband. Still weaning from brace. Rx focused on core bracing with added pushing  and pulling motions    Personal Factors and Comorbidities Comorbidity 1;Other    Comorbidities HTN, Cholecystectomy.    Examination-Activity Limitations Other    Examination-Participation Restrictions Other    Stability/Clinical Decision Making Stable/Uncomplicated    Rehab Potential Excellent    PT Frequency 3x / week    PT Duration 4 weeks    PT Treatment/Interventions ADLs/Self Care Home Management;Cryotherapy;Electrical Stimulation;Ultrasound;Moist Heat;Functional mobility training;Therapeutic activities;Therapeutic exercise;Manual techniques;Patient/family education;Passive range of motion    PT Next Visit Plan Progress per lumbar discectomy protocol for core and functional strength to prepare for return to work.    Consulted and Agree with Plan of Care Patient             Patient will benefit from skilled therapeutic intervention in order to improve the following deficits and impairments:  Decreased activity tolerance, Pain  Visit Diagnosis: Acute left-sided low back pain without sciatica     Problem List Patient Active Problem List   Diagnosis Date Noted   Prediabetes 05/13/2020   Obesity (BMI 35.0-39.9 without comorbidity) 08/27/2016   Essential hypertension 06/12/2014   GERD (gastroesophageal reflux disease) 06/12/2014    Madyx Delfin,CHRIS, PTA 12/17/2020, 11:17 AM  [facility] [address] [place], [place], [address] Phone: [phone]   Fax:  [phone]  Name: Jackie Ayala MRN: [id number] Date of Birth: January 08, 1961

## 2020-12-19 ENCOUNTER — Other Ambulatory Visit: Payer: Self-pay

## 2020-12-19 ENCOUNTER — Ambulatory Visit: Payer: 59 | Admitting: *Deleted

## 2020-12-19 DIAGNOSIS — M545 Low back pain, unspecified: Secondary | ICD-10-CM | POA: Diagnosis not present

## 2020-12-19 NOTE — Therapy (Signed)
[facility] [place], [place], [address] Phone: [phone]   Fax:  [phone]  Physical Therapy Treatment  Patient Details  Name: Jackie Ayala MRN: [id number] Date of Birth: [birth year] Referring Provider (PT): Melina Schools MD   Encounter Date: 12/19/2020   PT End of Session - 12/19/20 0953     Visit Number 19    Number of Visits 24    Date for PT Re-Evaluation 01/01/21    PT Start Time 0945    PT Stop Time 1034    PT Time Calculation (min) 49 min             Past Medical History:  Diagnosis Date   Hypertension     Past Surgical History:  Procedure Laterality Date   ABDOMINAL HYSTERECTOMY  2004   ovaries remain   CHOLECYSTECTOMY      There were no vitals filed for this visit.   Subjective Assessment - 12/19/20 0952     Subjective COVID-19 screen performed prior to patient entering clinic. Patient reports doing better each day. Feeling okay today    Pertinent History HTN, Cholecystectomy.    Patient Stated Goals Get back to a regular happy life.    Currently in Pain? Yes    Pain Score 2     Pain Location Back    Pain Orientation Lower    Pain Descriptors / Indicators Sore                               [facility] Adult PT Treatment/Exercise - 12/19/20 0001       Lumbar Exercises: Aerobic   Tread Mill 1.5 x 10 minutes.    Nustep Level 4 x 15 min UE/LE      Lumbar Exercises: Standing   Row Both;20 reps   hold 5 secs   Row Limitations XTS Blue    Shoulder Extension Both;20 reps   hold 5 secs   Shoulder Extension Limitations Blue XTS    Other Standing Lumbar Exercises wall pushups x20 reps    Other Standing Lumbar Exercises AT counter:   Bird-dog x6 each hold 10 secs, x4 hold 10 secs, x2 hold 10 secs                          PT Long Term Goals - 12/17/20 0933       PT LONG TERM GOAL #1   Title Independent with a HEP.    Time 4    Period Weeks    Status  Achieved      PT LONG TERM GOAL #2   Title Perform ADL's with pain not > 2/10.    Time 4    Period Weeks    Status Achieved      PT LONG TERM GOAL #3   Title Return to work.    Time 4    Period Weeks    Status On-going                   Plan - 12/19/20 1021     Clinical Impression Statement Pt arrived today doing fairly well and reports doing better overall with ADL's. Taking care of her husband is still her greatest daily challenge, but getting easier with less pain. Mainly MM tightness end of Rx.    Personal Factors and Comorbidities Comorbidity 1;Other    Comorbidities HTN, Cholecystectomy.  Examination-Participation Restrictions Other    Stability/Clinical Decision Making Stable/Uncomplicated    Rehab Potential Excellent    PT Frequency 3x / week    PT Duration 4 weeks    PT Treatment/Interventions ADLs/Self Care Home Management;Cryotherapy;Electrical Stimulation;Ultrasound;Moist Heat;Functional mobility training;Therapeutic activities;Therapeutic exercise;Manual techniques;Patient/family education;Passive range of motion    PT Next Visit Plan Progress per lumbar discectomy protocol for core and functional strength to prepare for return to work.    Consulted and Agree with Plan of Care Patient             Patient will benefit from skilled therapeutic intervention in order to improve the following deficits and impairments:  Decreased activity tolerance, Pain  Visit Diagnosis: Acute left-sided low back pain without sciatica     Problem List Patient Active Problem List   Diagnosis Date Noted   Prediabetes 05/13/2020   Obesity (BMI 35.0-39.9 without comorbidity) 08/27/2016   Essential hypertension 06/12/2014   GERD (gastroesophageal reflux disease) 06/12/2014    Zora Glendenning,CHRIS, PTA 12/19/2020, 10:38 AM  [facility] [address] [place], [place], [address] Phone: [phone]   Fax:  [phone]  Name:  Jamie-Lee Galdamez MRN: [id number] Date of Birth: 17-Sep-1960

## 2020-12-24 ENCOUNTER — Other Ambulatory Visit: Payer: Self-pay

## 2020-12-24 ENCOUNTER — Ambulatory Visit: Payer: 59 | Attending: Orthopedic Surgery | Admitting: Physical Therapy

## 2020-12-24 DIAGNOSIS — M545 Low back pain, unspecified: Secondary | ICD-10-CM | POA: Insufficient documentation

## 2020-12-24 NOTE — Therapy (Signed)
[facility] [place], [place], [address] Phone: [phone]   Fax:  [phone]  Physical Therapy Treatment  Patient Details  Name: Jackie Ayala MRN: [id number] Date of Birth: [birth year] Referring Provider (PT): Melina Schools MD   Encounter Date: 12/24/2020   PT End of Session - 12/24/20 1100     Visit Number 20    Number of Visits 24    Date for PT Re-Evaluation 01/01/21    PT Start Time 0946    PT Stop Time 1029    PT Time Calculation (min) 43 min    Activity Tolerance Patient tolerated treatment well    Behavior During Therapy [facility] for tasks assessed/performed             Past Medical History:  Diagnosis Date   Hypertension     Past Surgical History:  Procedure Laterality Date   ABDOMINAL HYSTERECTOMY  2004   ovaries remain   CHOLECYSTECTOMY      There were no vitals filed for this visit.   Subjective Assessment - 12/24/20 1108     Subjective COVID-19 screen performed prior to patient entering clinic.  No new complaints.    Pertinent History HTN, Cholecystectomy.    Patient Stated Goals Get back to a regular happy life.    Currently in Pain? Yes    Pain Score 2     Pain Orientation Lower    Pain Descriptors / Indicators Sore    Pain Type Surgical pain    Pain Onset More than a month ago                               [facility] Adult PT Treatment/Exercise - 12/24/20 0001       Exercises   Exercises Knee/Hip      Lumbar Exercises: Aerobic   Tread Mill 1.6 x 12 minutes with excellent posture.    Recumbent Bike level 3 x 10 minutes.    Nustep level 4 x 10 minutes.      Knee/Hip Exercises: Machines for Strengthening   Cybex Knee Extension 10# x 3 minutes.    Cybex Knee Flexion 30# x 3 minutes.                          PT Long Term Goals - 12/24/20 1111       PT LONG TERM GOAL #1   Baseline Met 12/04/20    Time 4    Period Weeks    Status Achieved       PT LONG TERM GOAL #2   Title Perform ADL's with pain not > 2/10.    Baseline limited with some ADL's 12/04/20    Time 4    Period Weeks    Status Achieved      PT LONG TERM GOAL #3   Baseline Not back to work yet    Time 4    Period Weeks    Status On-going                   Plan - 12/24/20 1111     Clinical Impression Statement the patient is highly motivated and did a great job today with the addition of recumbent bike and resisted quad and ham exercises.  She performed exercises with excellent technique and without complaint.    Personal Factors and Comorbidities Comorbidity 1;Other    Comorbidities HTN, Cholecystectomy.  Examination-Participation Restrictions Other    Rehab Potential Excellent    PT Frequency 3x / week    PT Duration 4 weeks    PT Treatment/Interventions ADLs/Self Care Home Management;Cryotherapy;Electrical Stimulation;Ultrasound;Moist Heat;Functional mobility training;Therapeutic activities;Therapeutic exercise;Manual techniques;Patient/family education;Passive range of motion    PT Next Visit Plan Progress per lumbar discectomy protocol for core and functional strength to prepare for return to work.    Consulted and Agree with Plan of Care Patient             Patient will benefit from skilled therapeutic intervention in order to improve the following deficits and impairments:  Decreased activity tolerance, Pain  Visit Diagnosis: Acute left-sided low back pain without sciatica     Problem List Patient Active Problem List   Diagnosis Date Noted   Prediabetes 05/13/2020   Obesity (BMI 35.0-39.9 without comorbidity) 08/27/2016   Essential hypertension 06/12/2014   GERD (gastroesophageal reflux disease) 06/12/2014   Progress Note Reporting Period 10/23/20 to 12/24/20.  See note below for Objective Data and Assessment of Progress/Goals. The patient has been  highly motivated and is making excellent progress and she is expected to meet  all goals.  She has 4 visits remaining.    Camela Wich, [place], PT 12/24/2020, 11:14 AM  [facility] [place], [place], [address] Phone: [phone]   Fax:  [phone]  Name: Jackie Ayala MRN: [id number] Date of Birth: 12-11-1960

## 2020-12-26 ENCOUNTER — Ambulatory Visit: Payer: 59 | Admitting: *Deleted

## 2020-12-26 DIAGNOSIS — M545 Low back pain, unspecified: Secondary | ICD-10-CM | POA: Diagnosis not present

## 2020-12-26 NOTE — Therapy (Signed)
[facility] [place], [place], [address] Phone: [phone]   Fax:  [phone]  Physical Therapy Treatment  Patient Details  Name: Jackie Ayala MRN: [id number] Date of Birth: 19-Jan-1961 Referring Provider (PT): Melina Schools MD   Encounter Date: 12/26/2020   PT End of Session - 12/26/20 1103     Visit Number 21    Number of Visits 24    Date for PT Re-Evaluation 01/01/21    PT Start Time 1030    PT Stop Time 1115    PT Time Calculation (min) 45 min             Past Medical History:  Diagnosis Date   Hypertension     Past Surgical History:  Procedure Laterality Date   ABDOMINAL HYSTERECTOMY  2004   ovaries remain   CHOLECYSTECTOMY      There were no vitals filed for this visit.   Subjective Assessment - 12/26/20 1103     Subjective COVID-19 screen performed prior to patient entering clinic.  No new complaints.                               [facility] Adult PT Treatment/Exercise - 12/26/20 0001       Exercises   Exercises Knee/Hip      Lumbar Exercises: Aerobic   Tread Mill 1.6 x 12 minutes with excellent posture.    Nustep level 4 x 10 minutes.      Lumbar Exercises: Standing   Row Both;20 reps    Row Limitations XTS Blue    Shoulder Extension Both;20 reps    Shoulder Extension Limitations BLue XTS    Other Standing Lumbar Exercises wall pushups x20 reps    Other Standing Lumbar Exercises AT counter:   Bird-dog x6 each hold 10 secs, x4 hold 10 secs, x2 hold 10 secs                          PT Long Term Goals - 12/24/20 1111       PT LONG TERM GOAL #1   Baseline Met 12/04/20    Time 4    Period Weeks    Status Achieved      PT LONG TERM GOAL #2   Title Perform ADL's with pain not > 2/10.    Baseline limited with some ADL's 12/04/20    Time 4    Period Weeks    Status Achieved      PT LONG TERM GOAL #3   Baseline Not back to work yet    Time 4     Period Weeks    Status On-going                   Plan - 12/26/20 1124     Clinical Impression Statement Pt arrived today doing fairly well and was able to continue with posture and core exs and Act.'s. She has met LTGs except pain with ADL's is still high at times.    Personal Factors and Comorbidities Comorbidity 1;Other    Comorbidities HTN, Cholecystectomy.    Examination-Activity Limitations Other    Examination-Participation Restrictions Other    Stability/Clinical Decision Making Stable/Uncomplicated    PT Treatment/Interventions ADLs/Self Care Home Management;Cryotherapy;Electrical Stimulation;Ultrasound;Moist Heat;Functional mobility training;Therapeutic activities;Therapeutic exercise;Manual techniques;Patient/family education;Passive range of motion    PT Next Visit Plan Progress per lumbar discectomy protocol for core and  functional strength to prepare for return to work.    Consulted and Agree with Plan of Care Patient             Patient will benefit from skilled therapeutic intervention in order to improve the following deficits and impairments:  Decreased activity tolerance, Pain  Visit Diagnosis: Acute left-sided low back pain without sciatica     Problem List Patient Active Problem List   Diagnosis Date Noted   Prediabetes 05/13/2020   Obesity (BMI 35.0-39.9 without comorbidity) 08/27/2016   Essential hypertension 06/12/2014   GERD (gastroesophageal reflux disease) 06/12/2014    Dermot Gremillion,CHRIS, PTA 12/26/2020, 1:19 PM  [facility] [address] [place], [place], [address] Phone: [phone]   Fax:  [phone]  Name: Jackie Ayala MRN: [id number] Date of Birth: Jan 31, 1961

## 2020-12-31 ENCOUNTER — Ambulatory Visit: Payer: 59 | Admitting: Physical Therapy

## 2020-12-31 ENCOUNTER — Other Ambulatory Visit: Payer: Self-pay

## 2020-12-31 ENCOUNTER — Encounter: Payer: Self-pay | Admitting: Physical Therapy

## 2020-12-31 DIAGNOSIS — M545 Low back pain, unspecified: Secondary | ICD-10-CM | POA: Diagnosis not present

## 2020-12-31 NOTE — Therapy (Signed)
[facility] [place], [place], [address] Phone: [phone]   Fax:  [phone]  Physical Therapy Treatment  Patient Details  Name: Jackie Ayala MRN: [id number] Date of Birth: 1960-10-07 Referring Provider (PT): Melina Schools MD   Encounter Date: 12/31/2020   PT End of Session - 12/31/20 0906     Visit Number 22    Number of Visits 24    Date for PT Re-Evaluation 01/01/21    PT Start Time 0900    PT Stop Time 0943    PT Time Calculation (min) 43 min    Activity Tolerance Patient tolerated treatment well    Behavior During Therapy [facility] for tasks assessed/performed             Past Medical History:  Diagnosis Date   Hypertension     Past Surgical History:  Procedure Laterality Date   ABDOMINAL HYSTERECTOMY  2004   ovaries remain   CHOLECYSTECTOMY      There were no vitals filed for this visit.   Subjective Assessment - 12/31/20 0903     Subjective COVID-19 screen performed prior to patient entering clinic.  Patient reported doing well upon arrival.    Pertinent History HTN, Cholecystectomy.    Patient Stated Goals Get back to a regular happy life.    Currently in Pain? Yes    Pain Score 1     Pain Location Back    Pain Orientation Lower    Pain Descriptors / Indicators Burning    Pain Type Surgical pain    Pain Onset More than a month ago    Pain Frequency Constant    Aggravating Factors  certain movements    Pain Relieving Factors at rest                               [facility] Adult PT Treatment/Exercise - 12/31/20 0001       Lumbar Exercises: Aerobic   Tread Mill 1.6 x 12 minutes with excellent posture.    Nustep level 4 x 15 min 80+SPM      Lumbar Exercises: Standing   Row Both;20 reps    Row Limitations Blue XTS    Shoulder Extension Both;20 reps    Shoulder Extension Limitations Blue XTS    Other Standing Lumbar Exercises wall pushups x20 reps    Other Standing  Lumbar Exercises AT counter:   Bird-dog x6 each hold 10 secs, x4 hold 10 secs, x2 hold 10 secs                          PT Long Term Goals - 12/31/20 0905       PT LONG TERM GOAL #1   Title Independent with a HEP.    Baseline Met 12/04/20    Time 4    Period Weeks    Status Achieved      PT LONG TERM GOAL #2   Title Perform ADL's with pain not > 2/10.    Baseline limited with some ADL's 12/04/20    Time 4    Period Weeks    Status Achieved      PT LONG TERM GOAL #3   Title Return to work.    Baseline Not back to work yet    Time 4    Period Weeks    Status On-going  Plan - 12/31/20 0919     Clinical Impression Statement Patient tolerated treatment well today, Patient continues to perform ADL's with greater ease and being careful with any bending movements and only using brace PRN for safety. Patient has not returned back to work at this time. Remaining goal progressing.    Personal Factors and Comorbidities Comorbidity 1;Other    Comorbidities HTN, Cholecystectomy.    Examination-Activity Limitations Other    Examination-Participation Restrictions Other    Stability/Clinical Decision Making Stable/Uncomplicated    Rehab Potential Excellent    PT Frequency 3x / week    PT Duration 4 weeks    PT Treatment/Interventions ADLs/Self Care Home Management;Cryotherapy;Electrical Stimulation;Ultrasound;Moist Heat;Functional mobility training;Therapeutic activities;Therapeutic exercise;Manual techniques;Patient/family education;Passive range of motion    PT Next Visit Plan Progress per lumbar discectomy protocol for core and functional strength to prepare for return to work.    Consulted and Agree with Plan of Care Patient             Patient will benefit from skilled therapeutic intervention in order to improve the following deficits and impairments:  Decreased activity tolerance, Pain  Visit Diagnosis: Acute left-sided low back pain  without sciatica     Problem List Patient Active Problem List   Diagnosis Date Noted   Prediabetes 05/13/2020   Obesity (BMI 35.0-39.9 without comorbidity) 08/27/2016   Essential hypertension 06/12/2014   GERD (gastroesophageal reflux disease) 06/12/2014    Darene Nappi P, PTA 12/31/2020, 9:43 AM  [facility] [place], [place], [address] Phone: [phone]   Fax:  [phone]  Name: Jackie Ayala MRN: [id number] Date of Birth: [birth year]

## 2021-01-02 ENCOUNTER — Other Ambulatory Visit: Payer: Self-pay

## 2021-01-02 ENCOUNTER — Ambulatory Visit: Payer: 59 | Admitting: Physical Therapy

## 2021-01-02 ENCOUNTER — Encounter: Payer: Self-pay | Admitting: Physical Therapy

## 2021-01-02 DIAGNOSIS — M545 Low back pain, unspecified: Secondary | ICD-10-CM | POA: Diagnosis not present

## 2021-01-02 NOTE — Patient Instructions (Addendum)
Access Code: [email or web address] URL: [email or web address] Date: 01/02/2021 Prepared by: Almyra Free  Exercises Deadlift with Resistance - 1 x daily - 7 x weekly - 3 sets - 10 reps Half Deadlift with Kettlebell - 1 x daily - 7 x weekly - 3 sets - 10 reps Squat with Chair Touch - 1 x daily - 7 x weekly - 3 sets - 10 reps  Patient Education TENS Therapy

## 2021-01-02 NOTE — Therapy (Signed)
[facility] [place], [place], [address] Phone: [phone]   Fax:  [phone]  Physical Therapy Treatment and Discharge Summary  Patient Details  Name: Jackie Ayala MRN: [id number] Date of Birth: 28-Dec-1960 Referring Provider (PT): Melina Schools MD   Encounter Date: 01/02/2021   PT End of Session - 01/02/21 0859     Visit Number 23    Number of Visits 24    Date for PT Re-Evaluation 01/01/21    PT Start Time 0900    PT Stop Time 0946    PT Time Calculation (min) 46 min    Activity Tolerance Patient tolerated treatment well    Behavior During Therapy [facility] for tasks assessed/performed             Past Medical History:  Diagnosis Date   Hypertension     Past Surgical History:  Procedure Laterality Date   ABDOMINAL HYSTERECTOMY  2004   ovaries remain   CHOLECYSTECTOMY      There were no vitals filed for this visit.   Subjective Assessment - 01/02/21 0901     Subjective COVID-19 screen performed prior to patient entering clinic.  Patient reported doing well. She just feels a burning intermittently right in the middle of the back.    Pertinent History HTN, Cholecystectomy.    Patient Stated Goals Get back to a regular happy life.    Currently in Pain? Yes    Pain Score 1     Pain Location Back    Pain Orientation Lower    Pain Descriptors / Indicators Burning                               [facility] Adult PT Treatment/Exercise - 01/02/21 0001       Self-Care   Self-Care Other Self-Care Comments    Other Self-Care Comments  TENS unit education of use,precautions and contraindications      Lumbar Exercises: Aerobic   Tread Mill 1.6 x 12 minutes with excellent posture.    Nustep level 4 x 15 min 80+SPM      Lumbar Exercises: Standing   Functional Squats 10 reps    Functional Squats Limitations 5 reps; also 5 reps with chair tap    Lifting Limitations 5# each hand dead lift x 10  with VCs for less knee bend                     PT Education - 01/02/21 0929     Education Details HEP finalized and education on TENS unit    Person(s) Educated Patient    Methods Explanation;Demonstration;Handout    Comprehension Verbalized understanding;Returned demonstration                 PT Long Term Goals - 01/02/21 0903       PT LONG TERM GOAL #1   Title Independent with a HEP.    Baseline Met 12/04/20    Status Achieved      PT LONG TERM GOAL #2   Title Perform ADL's with pain not > 2/10.    Baseline 1/10 pain at most    Status Achieved      PT LONG TERM GOAL #3   Title Return to work.    Baseline seeing Dr. Rolena Infante 03/03/21.    Status Deferred  Plan - 01/02/21 0907     Clinical Impression Statement Patient is pleased with her current level of progress. She has met her LTGs except RTW. She  has f/u with Dr. Rolena Infante 03/03/21. She reports she does her HEP and works a lot with her husband who has Parkinson's. We reviewed use of TENs machine today with precautions and contraindications discussed. She demonstrated good form with dead lifts and functional squats for HEP with only minimal cues for form. Added to HEP.    Comorbidities HTN, Cholecystectomy.    PT Frequency 3x / week    PT Duration 4 weeks    PT Treatment/Interventions ADLs/Self Care Home Management;Cryotherapy;Electrical Stimulation;Ultrasound;Moist Heat;Functional mobility training;Therapeutic activities;Therapeutic exercise;Manual techniques;Patient/family education;Passive range of motion    PT Next Visit Plan d/c to HEP    PT Home Exercise Plan TENS info    Consulted and Agree with Plan of Care Patient             Patient will benefit from skilled therapeutic intervention in order to improve the following deficits and impairments:  Decreased activity tolerance, Pain  Visit Diagnosis: Acute left-sided low back pain without sciatica     Problem  List Patient Active Problem List   Diagnosis Date Noted   Prediabetes 05/13/2020   Obesity (BMI 35.0-39.9 without comorbidity) 08/27/2016   Essential hypertension 06/12/2014   GERD (gastroesophageal reflux disease) 06/12/2014   Madelyn Flavors PT 01/02/2021, 12:50 PM  [facility] [address] [place], [place], [address] Phone: [phone]   Fax:  [phone]  Name: Jackie Ayala MRN: [id number] Date of Birth: [birth year]  PHYSICAL THERAPY DISCHARGE SUMMARY  Visits from Start of Care: 23  Current functional level related to goals / functional outcomes: See above   Remaining deficits: See above   Education / Equipment: HEP   Patient agrees to discharge. Patient goals were partially met. Patient is being discharged due to being pleased with the current functional level.  Madelyn Flavors, PT 01/02/21 12:50 PM [facility] [address] [place], [place], [address] Phone: [phone]   Fax:  [phone]

## 2021-01-09 IMAGING — MG DIGITAL SCREENING BILAT W/ TOMO W/ CAD
8 series · 8 of 24 positions shown · non-contrast
Comparison: Previous exam(s).

CLINICAL DATA: Screening.

EXAM:
DIGITAL SCREENING BILATERAL MAMMOGRAM WITH TOMO AND CAD

[L MLO synth-2D]
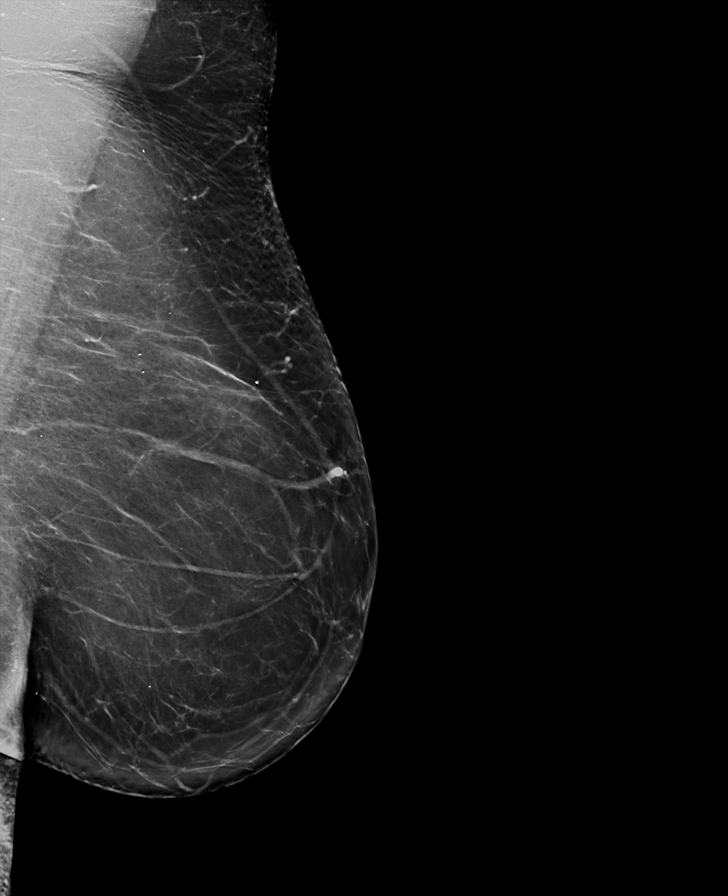

[R MLO synth-2D]
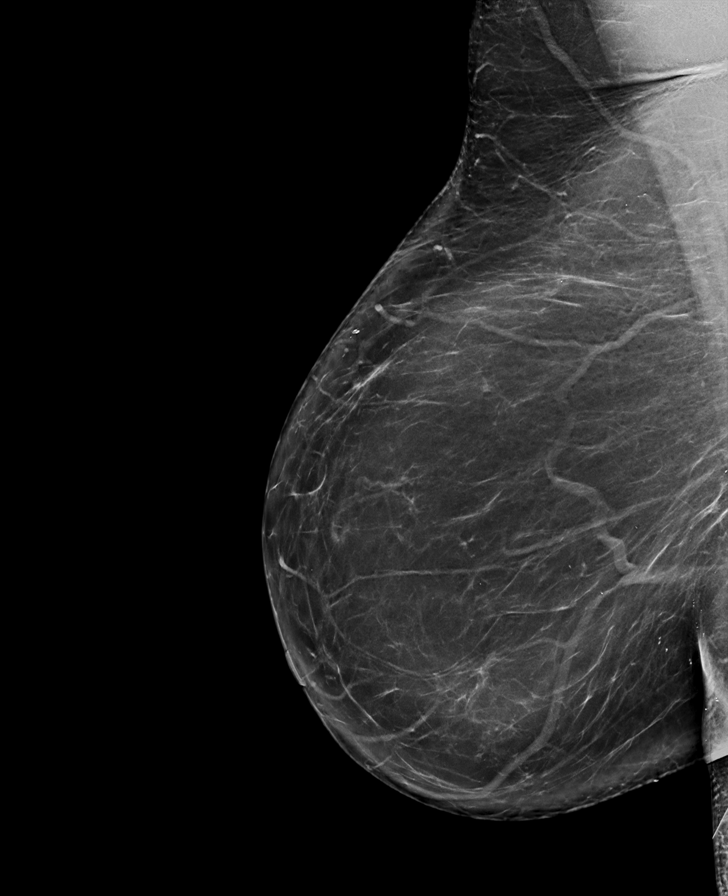

[L CC synth-2D]
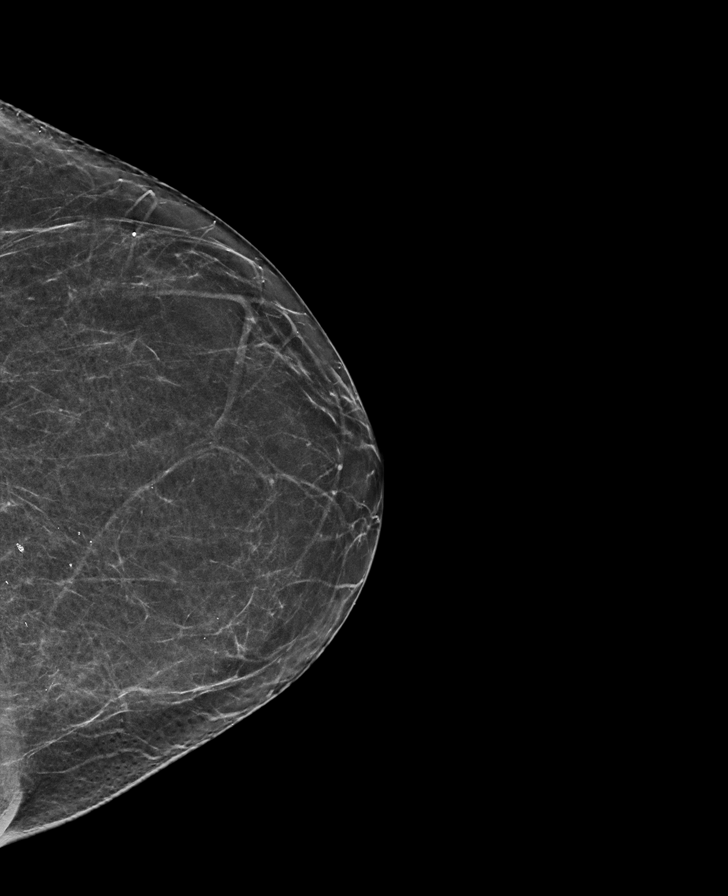

[R CC synth-2D]
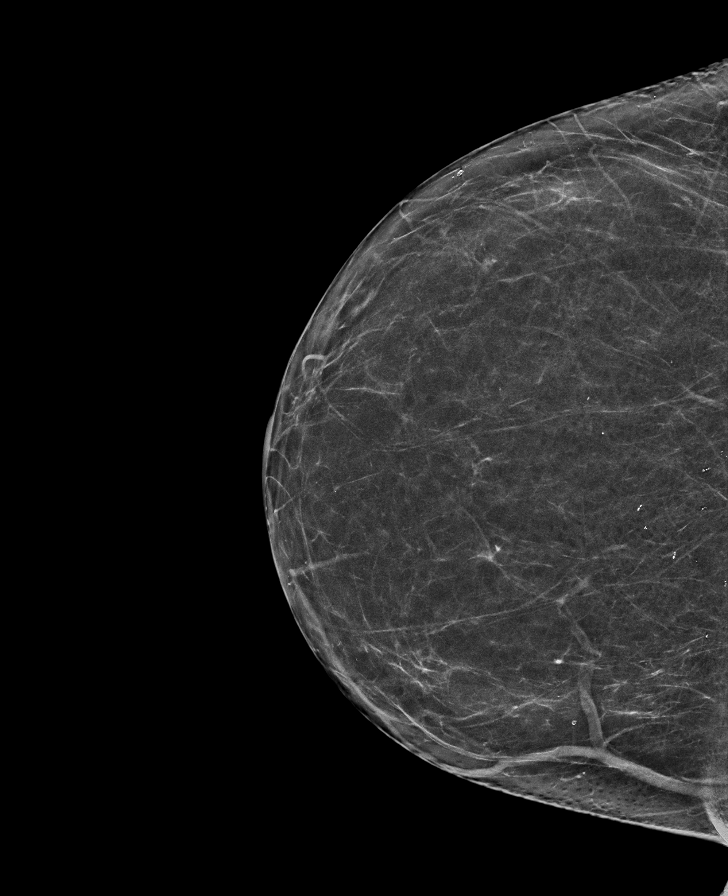

[R MLO tomo · tomo slice 45/90.0]
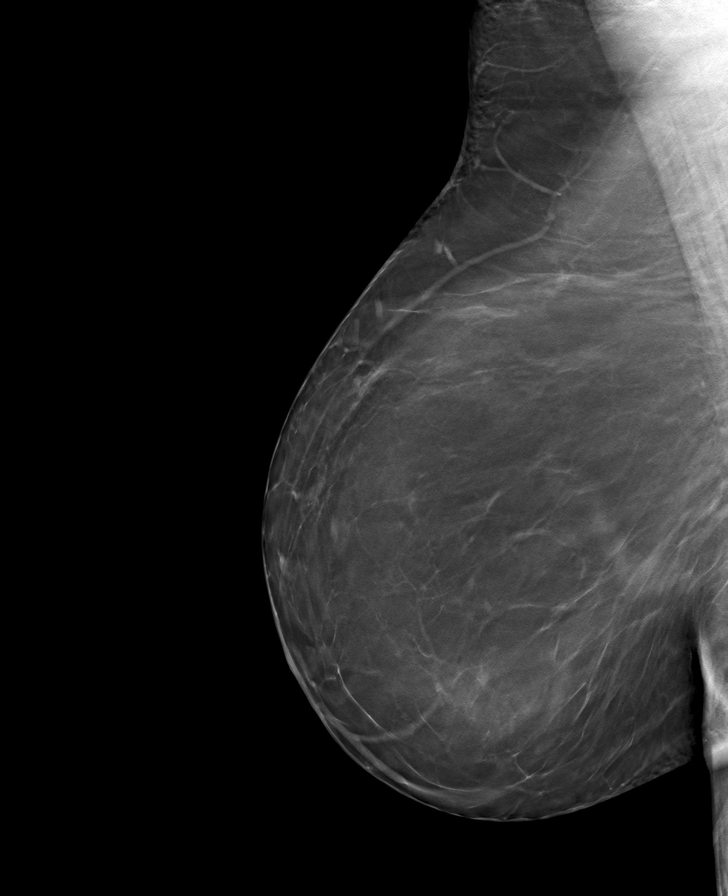

[R CC tomo · tomo slice 33/66.0]
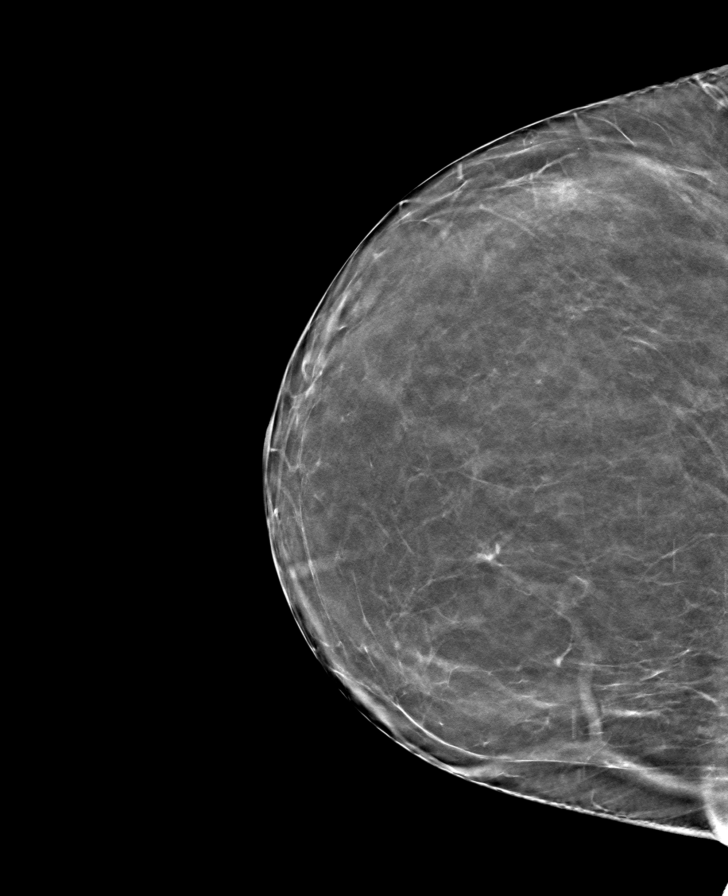

[L CC tomo · tomo slice 33/65.0]
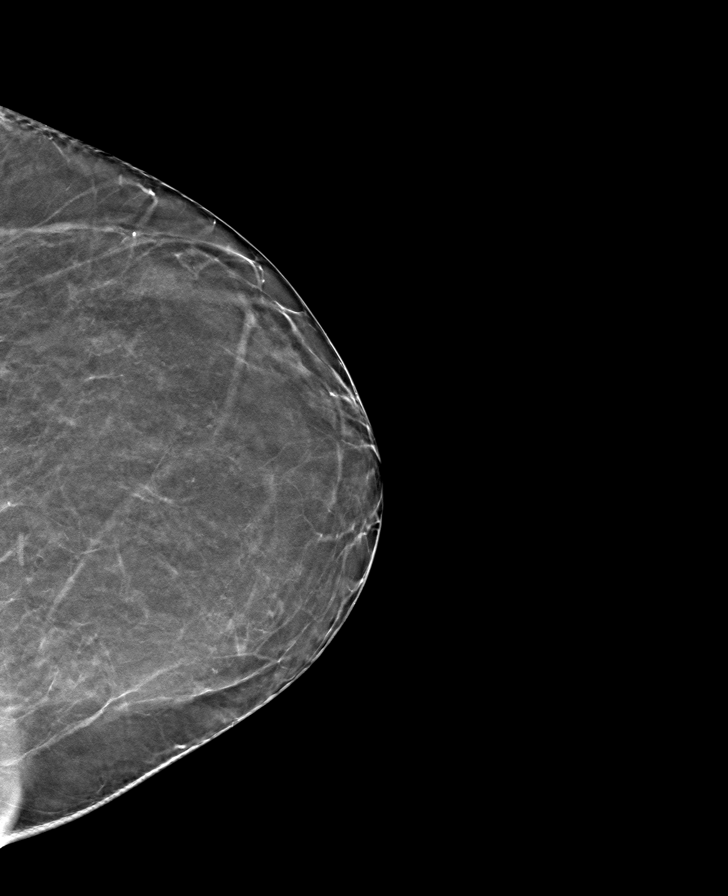

[L MLO tomo · tomo slice 45/89.0]
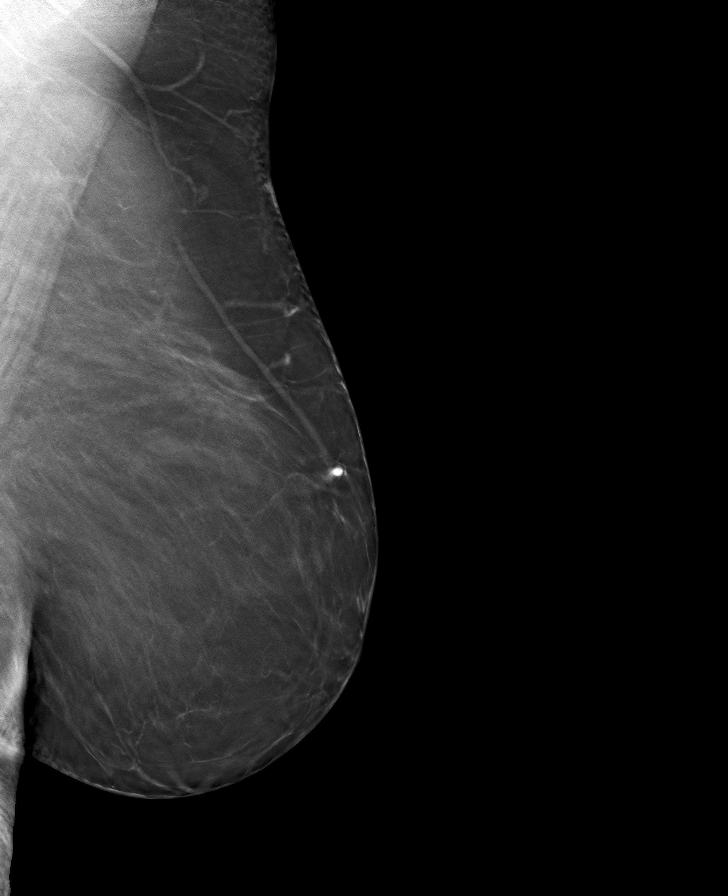

[8 of 24 positions shown; findings below may reference images not displayed]

ACR Breast Density Category b: There are scattered areas of
fibroglandular density.
FINDINGS: There are no findings suspicious for malignancy. Images were
processed with CAD.
IMPRESSION: No mammographic evidence of malignancy. A result letter of this
screening mammogram will be mailed directly to the patient.

RECOMMENDATION:
Screening mammogram in one year. (Code:CN-[id number])

BI-RADS CATEGORY  1: Negative.

## 2021-03-05 ENCOUNTER — Ambulatory Visit (INDEPENDENT_AMBULATORY_CARE_PROVIDER_SITE_OTHER): Payer: 59 | Admitting: Family

## 2021-03-05 ENCOUNTER — Encounter: Payer: Self-pay | Admitting: Family

## 2021-03-05 VITALS — BP 137/79 | HR 64 | Temp 97.4°F | Ht 62.0 in | Wt 200.0 lb

## 2021-03-05 DIAGNOSIS — K219 Gastro-esophageal reflux disease without esophagitis: Secondary | ICD-10-CM | POA: Diagnosis not present

## 2021-03-05 DIAGNOSIS — I10 Essential (primary) hypertension: Secondary | ICD-10-CM

## 2021-03-05 DIAGNOSIS — R7303 Prediabetes: Secondary | ICD-10-CM

## 2021-03-05 DIAGNOSIS — E1169 Type 2 diabetes mellitus with other specified complication: Secondary | ICD-10-CM | POA: Insufficient documentation

## 2021-03-05 DIAGNOSIS — E785 Hyperlipidemia, unspecified: Secondary | ICD-10-CM

## 2021-03-05 MED ORDER — OMEPRAZOLE 40 MG PO CPDR: 40.0000 mg | DELAYED_RELEASE_CAPSULE | Freq: Every day | ORAL | 3 refills | Status: DC

## 2021-03-05 MED ORDER — ATENOLOL 50 MG PO TABS: ORAL_TABLET | ORAL | 3 refills | Status: DC

## 2021-03-05 MED ORDER — ROSUVASTATIN CALCIUM 5 MG PO TABS: 5.0000 mg | ORAL_TABLET | Freq: Every day | ORAL | 3 refills | Status: DC

## 2021-03-05 MED ORDER — LISINOPRIL 20 MG PO TABS: 20.0000 mg | ORAL_TABLET | Freq: Every day | ORAL | 3 refills | Status: DC

## 2021-03-05 NOTE — Patient Instructions (Signed)

## 2021-03-05 NOTE — Progress Notes (Signed)
Subjective:    Patient ID: Jackie Ayala, female    DOB: 1960/09/16, 60 y.o.   MRN: [id number]  Chief Complaint  Patient presents with   Medical Management of Chronic Issues   Pt presents to the office today for chronic follow up. She is followed by Ortho and had lumbar surgery on 09/06/20. She is morbid obese with a BMI of 36 and HTN and Hyperlipemia.  Hypertension This is a chronic problem. The current episode started more than 1 year ago. The problem has been waxing and waning since onset. The problem is uncontrolled. Associated symptoms include malaise/fatigue. Pertinent negatives include no peripheral edema or shortness of breath. Risk factors for coronary artery disease include dyslipidemia, obesity and sedentary lifestyle. The current treatment provides moderate improvement.  Hyperlipidemia This is a chronic problem. The current episode started more than 1 year ago. Exacerbating diseases include obesity. Pertinent negatives include no shortness of breath. Current antihyperlipidemic treatment includes statins. The current treatment provides moderate improvement of lipids.  Gastroesophageal Reflux She complains of belching and heartburn. This is a chronic problem. The current episode started more than 1 year ago. The problem occurs occasionally. She has tried a PPI for the symptoms. The treatment provided moderate relief.     Review of Systems  Constitutional:  Positive for malaise/fatigue.  Respiratory:  Negative for shortness of breath.   Gastrointestinal:  Positive for heartburn.  All other systems reviewed and are negative.     Objective:   Physical Exam Vitals reviewed.  Constitutional:      General: She is not in acute distress.    Appearance: She is well-developed. She is obese.  HENT:     Head: Normocephalic and atraumatic.     Right Ear: Tympanic membrane normal.     Left Ear: Tympanic membrane normal.  Eyes:     Pupils: Pupils are equal, round, and reactive to  light.  Neck:     Thyroid: No thyromegaly.  Cardiovascular:     Rate and Rhythm: Normal rate and regular rhythm.     Heart sounds: Normal heart sounds. No murmur heard. Pulmonary:     Effort: Pulmonary effort is normal. No respiratory distress.     Breath sounds: Normal breath sounds. No wheezing.  Abdominal:     General: Bowel sounds are normal. There is no distension.     Palpations: Abdomen is soft.     Tenderness: There is no abdominal tenderness.  Musculoskeletal:        General: No tenderness. Normal range of motion.     Cervical back: Normal range of motion and neck supple.  Skin:    General: Skin is warm and dry.  Neurological:     Mental Status: She is alert and oriented to person, place, and time.     Cranial Nerves: No cranial nerve deficit.     Deep Tendon Reflexes: Reflexes are normal and symmetric.  Psychiatric:        Behavior: Behavior normal.        Thought Content: Thought content normal.        Judgment: Judgment normal.      BP (!) 157/80    Pulse 64    Temp (!) 97.4 F (36.3 C) (Temporal)    Ht 5' 2"  (1.575 m)    Wt 200 lb (90.7 kg)    BMI 36.58 kg/m      Assessment & Plan:  Kallyn Demarcus comes in today with chief complaint of Medical Management of Chronic  Issues   Diagnosis and orders addressed:  1. Essential hypertension - atenolol (TENORMIN) 50 MG tablet; One tab by mouth every day. Needs appointment for future refills.  Dispense: 90 tablet; Refill: 3 - lisinopril (ZESTRIL) 20 MG tablet; Take 1 tablet (20 mg total) by mouth daily.  Dispense: 90 tablet; Refill: 3 - CMP14+EGFR - CBC with Differential/Platelet  2. Gastroesophageal reflux disease, unspecified whether esophagitis present - omeprazole (PRILOSEC) 40 MG capsule; Take 1 capsule (40 mg total) by mouth daily. (Needs to be seen before next refill)  Dispense: 90 capsule; Refill: 3 - CMP14+EGFR - CBC with Differential/Platelet  3. Morbid obesity ([place]) - CMP14+EGFR - CBC with  Differential/Platelet  4. Prediabetes - CMP14+EGFR - CBC with Differential/Platelet  5. Hyperlipidemia, unspecified hyperlipidemia type - rosuvastatin (CRESTOR) 5 MG tablet; Take 1 tablet (5 mg total) by mouth daily.  Dispense: 90 tablet; Refill: 3 - CMP14+EGFR   Labs pending Health Maintenance reviewed Diet and exercise encouraged  Follow up plan: 6 months    Evelina Dun, FNP

## 2021-03-06 LAB — CBC WITH DIFFERENTIAL/PLATELET
Basophils Absolute: 0 10*3/uL (ref 0.0–0.2)
Basos: 1 %
EOS (ABSOLUTE): 0.1 10*3/uL (ref 0.0–0.4)
Eos: 1 %
Hematocrit: 36.9 % (ref 34.0–46.6)
Hemoglobin: 12.5 g/dL (ref 11.1–15.9)
Immature Grans (Abs): 0 10*3/uL (ref 0.0–0.1)
Immature Granulocytes: 0 %
Lymphocytes Absolute: 1.9 10*3/uL (ref 0.7–3.1)
Lymphs: 27 %
MCH: 30.3 pg (ref 26.6–33.0)
MCHC: 33.9 g/dL (ref 31.5–35.7)
MCV: 90 fL (ref 79–97)
Monocytes Absolute: 0.5 10*3/uL (ref 0.1–0.9)
Monocytes: 8 %
Neutrophils Absolute: 4.4 10*3/uL (ref 1.4–7.0)
Neutrophils: 63 %
Platelets: 290 10*3/uL (ref 150–450)
RBC: 4.12 x10E6/uL (ref 3.77–5.28)
RDW: 12.2 % (ref 11.7–15.4)
WBC: 7 10*3/uL (ref 3.4–10.8)

## 2021-03-06 LAB — CMP14+EGFR
ALT: 13 IU/L (ref 0–32)
AST: 15 IU/L (ref 0–40)
Albumin/Globulin Ratio: 1.4 (ref 1.2–2.2)
Albumin: 4.2 g/dL (ref 3.8–4.8)
Alkaline Phosphatase: 85 IU/L (ref 44–121)
BUN/Creatinine Ratio: 14 (ref 12–28)
BUN: 12 mg/dL (ref 8–27)
Bilirubin Total: 0.3 mg/dL (ref 0.0–1.2)
CO2: 26 mmol/L (ref 20–29)
Calcium: 9.6 mg/dL (ref 8.7–10.3)
Chloride: 103 mmol/L (ref 96–106)
Creatinine, Ser: 0.87 mg/dL (ref 0.57–1.00)
Globulin, Total: 3.1 g/dL (ref 1.5–4.5)
Glucose: 131 mg/dL — ABNORMAL HIGH (ref 70–99)
Potassium: 4.8 mmol/L (ref 3.5–5.2)
Sodium: 142 mmol/L (ref 134–144)
Total Protein: 7.3 g/dL (ref 6.0–8.5)
eGFR: 75 mL/min/{1.73_m2} (ref >59)

## 2021-03-07 LAB — HGB A1C W/O EAG: Hgb A1c MFr Bld: 5.9 % — ABNORMAL HIGH (ref 4.8–5.6)

## 2021-03-07 LAB — SPECIMEN STATUS REPORT

## 2021-05-06 ENCOUNTER — Encounter: Payer: Self-pay | Admitting: Nurse Practitioner

## 2021-05-06 ENCOUNTER — Ambulatory Visit (INDEPENDENT_AMBULATORY_CARE_PROVIDER_SITE_OTHER): Payer: 59 | Admitting: Nurse Practitioner

## 2021-05-06 VITALS — BP 120/63 | HR 57 | Temp 97.3°F | Resp 20 | Ht 62.0 in | Wt 194.0 lb

## 2021-05-06 DIAGNOSIS — M778 Other enthesopathies, not elsewhere classified: Secondary | ICD-10-CM | POA: Diagnosis not present

## 2021-05-06 MED ORDER — PREDNISONE 20 MG PO TABS: 40.0000 mg | ORAL_TABLET | Freq: Every day | ORAL | 0 refills | Status: AC

## 2021-05-06 NOTE — Patient Instructions (Signed)
Flexor Carpi Ulnaris and [employer] Radialis Tendinitis Rehab Ask your health care provider which exercises are safe for you. Do exercises exactly as told by your health care provider and adjust them as directed. It is normal to feel mild stretching, pulling, tightness, or discomfort as you do these exercises. Stop right away if you feel sudden pain or your pain gets worse. Do not begin these exercises until told by your health care provider. Range-of-motion exercises These exercises warm up your muscles and joints and improve the movement and flexibility of your forearm. These exercises also help to relieve pain, numbness, and tingling. They are done using the muscles in your injured forearm. Wrist flexion Sit or stand with your left / right elbow bent to a 90-degree angle (right angle) at your side and your palm facing the floor. Slowly bend your wrist so that your fingers move toward the floor (flexion), stopping when you feel a gentle stretch over the back of your forearm. Hold this position for __________ seconds. Slowly return to the starting position. Repeat __________ times. Complete this exercise __________ times a day. Wrist extension Sit or stand with your left / right elbow bent to a 90-degree angle (right angle) at your side and your palm facing the floor. Slowly lift your wrist up so that your fingers move toward the ceiling (extension), stopping when you feel a gentle stretch on the palm side of your forearm. Hold this position for __________ seconds. Slowly return to the starting position. Repeat __________ times. Complete this exercise __________ times a day. Forearm rotation, supination Sit or stand with your left / right elbow bent to a 90-degree angle (right angle) at your side. Position your forearm so that the thumb is facing the ceiling (neutral position). Turn (rotate) your palm up toward the ceiling (supination) until you feel a gentle stretch on the inside of your  forearm. Hold this position for __________ seconds. Slowly return your palm to the starting position. Repeat __________ times. Complete this exercise __________ times a day. Forearm rotation, pronation Sit or stand with your left / right elbow bent to a 90-degree angle (right angle) at your side. Position your forearm so that the thumb is facing the ceiling (neutral position). Turn (rotate) your palm down toward the floor (pronation) until you feel a gentle stretch on the top of your forearm. Hold this position for __________ seconds. Slowly return your palm to the starting position. Repeat __________ times. Complete this exercise __________ times a day. Stretching exercises These exercises warm up your muscles and joints and improve the movement and flexibility of your forearm. These exercises also help to relieve pain, numbness, and tingling. They are done using your healthy forearm to help stretch the muscles in your injured forearm. Wrist flexion, assisted  Sit or stand and extend your left / right arm in front of you. Turn your palm down toward the floor and relax your wrist. With your other hand (assisted), gently push on the back of your hand to bend your wrist and fingers toward the floor (flexion). Stop when you feel a gentle stretch on the top of your forearm. Hold this position for __________ seconds. Slowly return to the starting position. Repeat __________ times. Complete this exercise __________ times a day. Wrist extension, assisted  Sit or stand and extend your left / right arm in front of you. Turn your palm up toward the ceiling and relax your wrist. With your other hand (assisted), gently pull your palm and fingertips  back so your fingers point down toward the floor (extension). You should feel a gentle stretch on the palm-side of your forearm. Hold this position for __________ seconds. Slowly return to the starting position. Repeat __________ times. Complete this exercise  __________ times a day. Forearm rotation, supination Sit with your left / right elbow bent to a 90-degree angle (right angle) with your forearm resting on a table. Keeping your upper body and shoulder still, use your other hand to turn (rotate) your forearm palm-up (supination) until you feel a gentle to moderate stretch. Hold this position for __________ seconds. Slowly release the stretch, and return to the starting position. Repeat __________ times. Complete this exercise __________ times a day. Forearm rotation, pronation Sit with your left / right elbow bent to a 90-degree angle (right angle) with your forearm resting on a table. Keeping your upper body and shoulder still, use your other hand to turn (rotate) your forearm palm down (pronation) until you feel a gentle to moderate stretch. Hold this position for __________ seconds. Slowly release the stretch, and return to the starting position. Repeat __________ times. Complete this exercise __________ times a day. Strengthening exercises These exercises build strength and endurance in your wrist and forearm. Endurance is the ability to use your muscles for a long time, even after they get tired. Wrist flexion  Sit with your left / right forearm supported on a table and your hand resting palm-up over the edge of the table. Your elbow should be bent to a 90-degree angle (right angle) and rest below the level of your shoulder. Hold a __________ weight in your hand. Or, hold a rubber exercise band or tube in both hands, keeping your hands at the same level and hip distance apart. There should be a slight tension in the exercise band or tube. Slowly lift your hand up toward the ceiling (flexion). Hold this position for __________ seconds. Slowly lower your hand back to the starting position. Repeat __________ times. Complete this exercise __________ times a day. Wrist extension  Sit with your left / right forearm supported on a table and  your hand resting palm-down over the edge of the table. Your elbow should be bent to a 90-degree angle (right angle) and rest below the level of your shoulder. Hold a __________ weight in your hand. Or, hold a rubber exercise band or tube in both hands, keeping your hands at the same level and hip distance apart. There should be a slight tension in the exercise band or tube. Slowly lift your hand up toward the ceiling (extension). Hold this position for __________ seconds. Slowly lower your hand back to the starting position. Repeat __________ times. Complete this exercise __________ times a day. Ulnar deviation Stand with a __________ weight in your left / right hand. Or, sit with your healthy hand supported, and hold on to a rubber exercise band or tube with both hands. There should be a slight tension in the exercise band or tube. Move your wrist so your pinkie travels toward your forearm and your thumb moves away from your forearm (ulnar deviation). Hold this position for __________ seconds. Slowly return to the starting position. Repeat __________ times. Complete this exercise __________ times a day. Radial deviation Stand with a __________ weight in your left / right hand. Or, sit and hold on to a rubber exercise band or tube with both hands while your left / right arm is supported on a table and the other arm is below the  table. There should be a slight tension in the exercise band or tube. If you are holding a weight, raise your left / right hand so your thumb moves toward your forearm at a comfortable height. You will not need to raise your hand very far. If you are holding an exercise band or tube, pull on it to raise your thumb toward the ceiling. You will not need to raise your hand very far. Hold this position for __________ seconds. Slowly lower your wrist to the starting position. Repeat __________ times. Complete this exercise __________ times a day. Forearm rotation,  supination  Sit with your left / right forearm supported on a table. Keep your hand resting palm-down and your wrist over the edge of the table. Your elbow should be at your side and bent to a 90-degree angle (right angle). Keep your wrist stable. Do not allow it to bend backward or forward during the exercise. Gently hold a lightweight hammer with your left / right hand. Without moving your elbow or wrist, slowly turn (rotate) your forearm so your palm faces up toward the ceiling (supination). Hold this position for __________ seconds. Slowly return your forearm to the starting position. Repeat __________ times. Complete this exercise __________ times a day. Forearm rotation, pronation  Sit with your left / right forearm supported on a table. Keep your hand resting palm-up and your wrist over the edge of the table. Your elbow should be at your side and bent to a 90-degree angle (right angle). Keep your wrist stable. Do not allow it to bend backward or forward during the exercise. Gently hold a lightweight hammer with your left / right hand. Without moving your elbow or wrist, slowly turn (rotate) your palm down toward the floor (pronation). Hold this position for __________ seconds. Slowly return your forearm to the starting position. Repeat __________ times. Complete this exercise __________ times a day. Grip  Hold one of these items in your left / right hand: modeling clay, therapy putty, a dense sponge, a stress ball, or a large, rolled sock. Squeeze as hard as you can without increasing any pain. Hold this position for __________ seconds. Slowly release your grip. Repeat __________ times. Complete this exercise __________ times a day. This information is not intended to replace advice given to you by your health care provider. Make sure you discuss any questions you have with your health care provider. Document Revised: 07/05/2018 Document Reviewed: 05/12/2018 Elsevier Patient Education   [place].

## 2021-05-06 NOTE — Progress Notes (Signed)
° °  Subjective:    Patient ID: Jackie Ayala, female    DOB: Nov 07, 1960, 61 y.o.   MRN: [id number]  Chief Complaint: Wrist Pain (Right wrist. Shooting pain up arm)   Wrist Pain  The pain is present in the right wrist. This is a new problem. The current episode started 1 to 4 weeks ago. There has been no history of extremity trauma. The problem occurs intermittently. The problem has been waxing and waning. The quality of the pain is described as pounding, burning and aching. The pain is at a severity of 5/10. The pain is moderate. Associated symptoms include a limited range of motion. The symptoms are aggravated by activity. She has tried NSAIDS for the symptoms. The treatment provided moderate relief. Family history does not include gout or rheumatoid arthritis.      Review of Systems  Musculoskeletal:  Positive for arthralgias (right wrist pain).      Objective:   Physical Exam Vitals reviewed.  Constitutional:      Appearance: Normal appearance.  Cardiovascular:     Rate and Rhythm: Normal rate and regular rhythm.     Heart sounds: Normal heart sounds.  Pulmonary:     Effort: Pulmonary effort is normal.     Breath sounds: Normal breath sounds.  Musculoskeletal:     Comments: Decreased ROM of right wrist with pain on flexion and full extension Right grip weaker then left due to pain  Neurological:     Mental Status: She is alert.     BP 120/63    Pulse (!) 57    Temp (!) 97.3 F (36.3 C) (Temporal)    Resp 20    Ht 5\' 2"  (1.575 m)    Wt 194 lb (88 kg)    SpO2 97%    BMI 35.48 kg/m       Assessment & Plan:   Jackie Ayala in today with chief complaint of Wrist Pain (Right wrist. Shooting pain up arm)   1. Tendonitis of wrist, right Rest  Ice Compression Elevate  Meds ordered this encounter  Medications   predniSONE (DELTASONE) 20 MG tablet    Sig: Take 2 tablets (40 mg total) by mouth daily with breakfast for 5 days. 2 po daily for 5 days    Dispense:  10  tablet    Refill:  0    Order Specific Question:   Supervising Provider    Answer:   Caryl Pina A [[id number]]      The above assessment and management plan was discussed with the patient. The patient verbalized understanding of and has agreed to the management plan. Patient is aware to call the clinic if symptoms persist or worsen. Patient is aware when to return to the clinic for a follow-up visit. Patient educated on when it is appropriate to go to the emergency department.   Mary-Margaret Hassell Done, FNP

## 2021-06-10 ENCOUNTER — Encounter: Payer: Self-pay | Admitting: Family

## 2021-06-10 ENCOUNTER — Telehealth: Payer: Self-pay | Admitting: Family Medicine

## 2021-06-10 ENCOUNTER — Ambulatory Visit (INDEPENDENT_AMBULATORY_CARE_PROVIDER_SITE_OTHER): Payer: 59 | Admitting: Family

## 2021-06-10 VITALS — BP 164/82 | HR 67 | Temp 98.4°F | Ht 62.0 in | Wt 196.0 lb

## 2021-06-10 DIAGNOSIS — R5383 Other fatigue: Secondary | ICD-10-CM

## 2021-06-10 DIAGNOSIS — M25531 Pain in right wrist: Secondary | ICD-10-CM | POA: Diagnosis not present

## 2021-06-10 DIAGNOSIS — G5601 Carpal tunnel syndrome, right upper limb: Secondary | ICD-10-CM | POA: Diagnosis not present

## 2021-06-10 DIAGNOSIS — M2550 Pain in unspecified joint: Secondary | ICD-10-CM | POA: Diagnosis not present

## 2021-06-10 MED ORDER — DICLOFENAC SODIUM 75 MG PO TBEC: 75.0000 mg | DELAYED_RELEASE_TABLET | Freq: Two times a day (BID) | ORAL | 0 refills | Status: DC

## 2021-06-10 MED ORDER — DICLOFENAC SODIUM 1 % EX GEL: 2.0000 g | Freq: Four times a day (QID) | CUTANEOUS | 2 refills | Status: DC

## 2021-06-10 NOTE — Telephone Encounter (Signed)
Your demographic data has been sent to [place] successfully! ? ?[employer] typically takes 5-10 minutes to respond, but it may take a little longer in some cases. You will be notified by email when available. You can also check for an update later by opening this request from your dashboard. Please do not fax or call [employer] to resubmit this request. If you need assistance, please chat with [facility] or call us at [phone]. ? ?If it has been longer than 24 hours, please reach out to [place]. ?

## 2021-06-10 NOTE — Progress Notes (Signed)
? ?Subjective:  ? ? Patient ID: Jackie Ayala, female    DOB: 23-Dec-1960, 61 y.o.   MRN: [id number] ? ?Chief Complaint  ?Patient presents with  ? Wrist Pain  ?  Right and cant grip   ? Back Pain  ? Neck Pain  ? Arm Pain  ? Shoulder Pain  ? Fatigue  ? ? ?Wrist Pain  ? ?Back Pain ? ?Neck Pain  ? ?Arm Pain  ? ?Shoulder Pain  ? ?PT presents to the office today with generalized pain in her wrist, shoulder and neck.  ? ?She was seen in the office on 05/06/21 for tendonitis of right wrist and given prednisone. She reports this greatly helped, but now the pain is coming back. She has also been using diclofenac without relief.  ? 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   ? ?She is having to do repetitive motions. She wears a brace when helping her husband.  ? ?Reports decreased strength and grip.  ? ?She having generalized fatigue.  ? ?Review of Systems  ?Musculoskeletal:  Positive for back pain and neck pain.  ?All other systems reviewed and are negative. ? ?   ?Objective:  ? Physical Exam ?Vitals reviewed.  ?Constitutional:   ?   General: She is not in acute distress. ?   Appearance: She is well-developed. She is obese.  ?HENT:  ?   Head: Normocephalic and atraumatic.  ?   Right Ear: Tympanic membrane normal.  ?   Left Ear: Tympanic membrane normal.  ?Eyes:  ?   Pupils: Pupils are equal, round, and reactive to light.  ?Neck:  ?   Thyroid: No thyromegaly.  ?Cardiovascular:  ?   Rate and Rhythm: Normal rate and regular rhythm.  ?   Heart sounds: Normal heart sounds. No murmur heard. ?Pulmonary:  ?   Effort: Pulmonary effort is normal. No respiratory distress.  ?   Breath sounds: Normal breath sounds. No wheezing.  ?Abdominal:  ?   General: Bowel sounds are normal. There is no distension.  ?   Palpations: Abdomen is soft.  ?   Tenderness: There is no abdominal tenderness.  ?Musculoskeletal:     ?   General: No tenderness. Normal range of motion.  ?   Cervical  back: Normal range of motion and neck supple.  ?   Comments: phalen positive sign and negative tinel sign ?  ?Skin: ?   General: Skin is warm and dry.  ?Neurological:  ?   Mental Status: She is alert and oriented to person, place, and time.  ?   Cranial Nerves: No cranial nerve deficit.  ?   Deep Tendon Reflexes: Reflexes are normal and symmetric.  ?Psychiatric:     ?   Behavior: Behavior normal.     ?   Thought Content: Thought content normal.     ?   Judgment: Judgment normal.  ? ? ? ? ? ?  BP (!) 164/82   Pulse 67   Temp 98.4 ?F (36.9 ?C) (Temporal)   Ht 5' 2"  (1.575 m)   Wt 196 lb (88.9 kg)   BMI 35.85 kg/m?  ? ?Assessment & Plan:  ?Jackie Ayala comes in today with chief complaint of Wrist Pain (Right and cant grip ), Back Pain, Neck Pain, Arm Pain, Shoulder Pain, and Fatigue ? ? ?Diagnosis and orders addressed: ? ?1. Right wrist pain ?- Ambulatory referral to Hand Surgery ?-  diclofenac (VOLTAREN) 75 MG EC tablet; Take 1 tablet (75 mg total) by mouth 2 (two) times daily.  Dispense: 30 tablet; Refill: 0 ?- diclofenac Sodium (VOLTAREN) 1 % GEL; Apply 2 g topically 4 (four) times daily.  Dispense: 350 g; Refill: 2 ?- CMP14+EGFR ? 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  Dispense: 30 tablet; Refill: 0 ?- diclofenac Sodium (VOLTAREN) 1 % GEL; Apply 2 g topically 4 (four) times daily.  Dispense: 350 g; Refill: 2 ?- CMP14+EGFR ? ?3. Generalized joint pain ?- Arthritis Panel ?- CMP14+EGFR ? ?4. Other fatigue ?- Arthritis Panel ?- CMP14+EGFR ?- TSH ?- Vitamin B12 ? ? ?Labs pending ?Take diclofenac BID with food  ?Wear wrist splints at night and while doing work.  ?Follow up if symptoms worsen or do not improvement  ? ?Evelina Dun, FNP ? ? ? ?

## 2021-06-10 NOTE — Patient Instructions (Signed)

## 2021-06-11 LAB — ARTHRITIS PANEL
Basophils Absolute: 0 10*3/uL (ref 0.0–0.2)
Basos: 1 %
EOS (ABSOLUTE): 0.1 10*3/uL (ref 0.0–0.4)
Eos: 1 %
Hematocrit: 35.2 % (ref 34.0–46.6)
Hemoglobin: 11.9 g/dL (ref 11.1–15.9)
Immature Grans (Abs): 0 10*3/uL (ref 0.0–0.1)
Immature Granulocytes: 0 %
Lymphocytes Absolute: 1.7 10*3/uL (ref 0.7–3.1)
Lymphs: 24 %
MCH: 30.1 pg (ref 26.6–33.0)
MCHC: 33.8 g/dL (ref 31.5–35.7)
MCV: 89 fL (ref 79–97)
Monocytes Absolute: 0.5 10*3/uL (ref 0.1–0.9)
Monocytes: 8 %
Neutrophils Absolute: 4.7 10*3/uL (ref 1.4–7.0)
Neutrophils: 66 %
Platelets: 347 10*3/uL (ref 150–450)
RBC: 3.95 x10E6/uL (ref 3.77–5.28)
RDW: 12.7 % (ref 11.7–15.4)
Rheumatoid fact SerPl-aCnc: <10 IU/mL (ref <14.0)
Sed Rate: 23 mm/hr (ref 0–40)
Uric Acid: 4.8 mg/dL (ref 3.0–7.2)
WBC: 7 10*3/uL (ref 3.4–10.8)

## 2021-06-11 LAB — CMP14+EGFR
ALT: 11 IU/L (ref 0–32)
AST: 12 IU/L (ref 0–40)
Albumin/Globulin Ratio: 1.2 (ref 1.2–2.2)
Albumin: 4 g/dL (ref 3.8–4.8)
Alkaline Phosphatase: 86 IU/L (ref 44–121)
BUN/Creatinine Ratio: 16 (ref 12–28)
BUN: 14 mg/dL (ref 8–27)
Bilirubin Total: <0.2 mg/dL (ref 0.0–1.2)
CO2: 27 mmol/L (ref 20–29)
Calcium: 9.7 mg/dL (ref 8.7–10.3)
Chloride: 100 mmol/L (ref 96–106)
Creatinine, Ser: 0.9 mg/dL (ref 0.57–1.00)
Globulin, Total: 3.3 g/dL (ref 1.5–4.5)
Glucose: 107 mg/dL — ABNORMAL HIGH (ref 70–99)
Potassium: 5.1 mmol/L (ref 3.5–5.2)
Sodium: 140 mmol/L (ref 134–144)
Total Protein: 7.3 g/dL (ref 6.0–8.5)
eGFR: 72 mL/min/{1.73_m2} (ref >59)

## 2021-06-11 LAB — VITAMIN B12: Vitamin B-12: 519 pg/mL (ref 232–1245)

## 2021-06-11 LAB — TSH: TSH: 1.95 u[IU]/mL (ref 0.450–4.500)

## 2021-06-11 NOTE — Telephone Encounter (Signed)
Your information has been submitted to [employer]. To check for an updated outcome later, reopen this PA request from your dashboard. If [employer] has not responded to your request within 24 hours, contact [employer] at [phone]. If you think there may be a problem with your PA request, use our live chat feature at the bottom right.    

## 2021-06-13 NOTE — Telephone Encounter (Signed)
This request has received an approval. View the bottom of the request for an electronic copy of the approval letter.

## 2021-06-19 ENCOUNTER — Telehealth: Payer: Self-pay | Admitting: Family Medicine

## 2021-06-19 ENCOUNTER — Ambulatory Visit (INDEPENDENT_AMBULATORY_CARE_PROVIDER_SITE_OTHER): Payer: 59 | Admitting: Orthopedic Surgery

## 2021-06-19 ENCOUNTER — Ambulatory Visit (INDEPENDENT_AMBULATORY_CARE_PROVIDER_SITE_OTHER): Payer: 59

## 2021-06-19 DIAGNOSIS — R29898 Other symptoms and signs involving the musculoskeletal system: Secondary | ICD-10-CM

## 2021-06-19 DIAGNOSIS — M79641 Pain in right hand: Secondary | ICD-10-CM

## 2021-06-19 NOTE — Progress Notes (Signed)
? ?Office Visit Note ?  ?Patient: Jackie Ayala           ?Date of Birth: 01/03/1961           ?MRN: [id number] ?Visit Date: 06/19/2021 ?             ?Requested by: Sharion Balloon, FNP ?[address] ?[place],  [place] [address] ?PCP: Sharion Balloon, FNP ? ? ?Assessment & Plan: ?Visit Diagnoses:  ?1. Pain in right hand   ?2. Right hand weakness   ? ? ?Plan: After discussion with the patient it seems her biggest issue is hand weakness.  She does have some mild pain with resisted finger flexion.  This seems like an inflammatory issue such as flexor tenosynovitis at the wrist.  She is currently taking diclofenac and has previously completed a steroid taper.  She would like to try some hand therapy to work on overall hand strengthening and conditioning.  She lives in [place] and would like to see a therapist close to her.  She is going to find out the name of the therapy office near her and will give me a call at which point I will place a referral.  I can see her back in a month or so to see if she is any progress. ? ?Follow-Up Instructions: No follow-ups on file.  ? ?Orders:  ?Orders Placed This Encounter  ?Procedures  ? XR Hand Complete Right  ? ?No orders of the defined types were placed in this encounter. ? ? ? ? Procedures: ?No procedures performed ? ? ?Clinical Data: ?No additional findings. ? ? ?Subjective: ?Chief Complaint  ?Patient presents with  ? Right Wrist - New Patient (Initial Visit)  ? ? ?This is a 61 year old right-hand-dominant female who presents with weakness and mild pain in the right hand.  Is been going on for some months now.  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  She describes overall hand weakness that makes some of her daily tasks difficult.  She is able to make a full fist but is quite weak compared to the contralateral side.  She also notes some atraumatic swelling of the middle  finger.  The swelling seems to be intermittent.  She has tried a prednisone taper which provided some relief but not complete.  She is also taking diclofenac twice a day currently.  After our discussion, she relays that the biggest issue for her is not pain in the hand or wrist but rather weakness with her grip.  She denies any numbness or paresthesias in her fingers. ? ? ?Review of Systems ? ? ?Objective: ?Vital Signs: There were no vitals taken for this visit. ? ?Physical Exam ?Constitutional:   ?   Appearance: Normal appearance.  ?Cardiovascular:  ?   Rate and Rhythm: Normal rate.  ?   Pulses: Normal pulses.  ?Pulmonary:  ?   Effort: Pulmonary effort is normal.  ?Skin: ?   General: Skin is warm and dry.  ?   Capillary Refill: Capillary refill takes less than 2 seconds.  ?Neurological:  ?   Mental Status: She is alert.  ? ? ?Right Hand Exam  ? ?Tenderness  ?The patient is experiencing no tenderness.  ? ?Range of Motion  ?Wrist  ?Extension:  normal  ?Flexion:  normal  ? ?Muscle Strength  ?Grip: 4/5  ? ?Other  ?Erythema: absent ?Sensation: normal ?  Pulse: present ? ?Comments:  Able to make a complete fist but with notable weakness compared to the contralateral side.  No pain w/ wrist flexion or extension.  Mild discomfort that she describes at "tightness" or "pulling" with resisted finger flexion.  No TTP along flexor surface of middle finger with mild swelling compared to contralateral side.  Negative Tinel and Phalen signs.  ? ? ? ? ?Specialty Comments:  ?No specialty comments available. ? ?Imaging: ?No results found. ? ? ?PMFS History: ?Patient Active Problem List  ? Diagnosis Date Noted  ? Right hand weakness 06/19/2021  ? Hyperlipidemia 03/05/2021  ? Prediabetes 05/13/2020  ? Obesity (BMI 35.0-39.9 without comorbidity) 08/27/2016  ? Essential hypertension 06/12/2014  ? GERD (gastroesophageal reflux disease) 06/12/2014  ? ?Past Medical History:  ?Diagnosis Date  ? Hypertension   ?  ?Family History  ?Problem  Relation Age of Onset  ? Diabetes Mother   ? Breast cancer Mother 70  ? COPD Mother   ? Hypertension Mother   ? Heart disease Father   ?     Heart attack  ?  ?Past Surgical History:  ?Procedure Laterality Date  ? ABDOMINAL HYSTERECTOMY  2004  ? ovaries remain  ? CHOLECYSTECTOMY    ? ?Social History  ? ?Occupational History  ? Not on file  ?Tobacco Use  ? Smoking status: Never  ? Smokeless tobacco: Never  ?Vaping Use  ? Vaping Use: Never used  ?Substance and Sexual Activity  ? Alcohol use: No  ? Drug use: No  ? Sexual activity: Not Currently  ?  Birth control/protection: Surgical  ?  Comment: Hyst/ovaries remain  ? ? ? ? ? ? ?

## 2021-06-23 ENCOUNTER — Other Ambulatory Visit: Payer: Self-pay | Admitting: Orthopedic Surgery

## 2021-06-23 ENCOUNTER — Telehealth: Payer: Self-pay | Admitting: Orthopedic Surgery

## 2021-06-23 DIAGNOSIS — R29898 Other symptoms and signs involving the musculoskeletal system: Secondary | ICD-10-CM

## 2021-06-23 NOTE — Telephone Encounter (Signed)
Pt states per discussion with Dr. Tempie Donning to call when found a therapy place. Pt is asking for referral to be sent for physical therapy for hand/wrist to [facility]. Facility phone number is [phone]. Pt phone number is [phone]. ?

## 2021-06-24 ENCOUNTER — Telehealth: Payer: Self-pay

## 2021-06-24 NOTE — Telephone Encounter (Signed)
Jackie Ayala with [facility], outpatient rehab.would like a referral sent to their office.  Provider that patient will be seeing is Personal assistant at [facility] in [place].  CB# [phone].  Please advise.  Thank you. ?

## 2021-06-24 NOTE — Telephone Encounter (Signed)
April figured it out with them ?

## 2021-07-02 NOTE — Telephone Encounter (Signed)
error 

## 2021-07-08 ENCOUNTER — Encounter (HOSPITAL_COMMUNITY): Payer: Self-pay

## 2021-07-08 ENCOUNTER — Ambulatory Visit (HOSPITAL_COMMUNITY): Payer: 59 | Attending: Orthopedic Surgery

## 2021-07-08 DIAGNOSIS — M25541 Pain in joints of right hand: Secondary | ICD-10-CM | POA: Diagnosis present

## 2021-07-08 DIAGNOSIS — R600 Localized edema: Secondary | ICD-10-CM | POA: Insufficient documentation

## 2021-07-08 DIAGNOSIS — R29898 Other symptoms and signs involving the musculoskeletal system: Secondary | ICD-10-CM | POA: Insufficient documentation

## 2021-07-08 NOTE — Therapy (Signed)
?OUTPATIENT OCCUPATIONAL THERAPY ORTHO EVALUATION ? ?Patient Name: Jackie Ayala ?MRN: [id number] ?DOB:1960-08-08, 61 y.o., female ?Today's Date: 07/08/2021 ? ?PCP: Sharion Balloon, FNP ?REFERRING PROVIDER: Sherilyn Cooter, MD ? ? OT End of Session - 07/08/21 1327   ? ? Visit Number 1   ? Number of Visits 8   ? Date for OT Re-Evaluation 08/05/21   ? Authorization Type Generic [employer]   ? Authorization Time Period no auth, co copay, no visit limit   ? OT Start Time 0945   ? OT Stop Time 1030   ? OT Time Calculation (min) 45 min   ? Activity Tolerance Patient tolerated treatment well   ? Behavior During Therapy [facility] for tasks assessed/performed   ? ?  ?  ? ?  ? ? ?Past Medical History:  ?Diagnosis Date  ? Hypertension   ? ?Past Surgical History:  ?Procedure Laterality Date  ? ABDOMINAL HYSTERECTOMY  2004  ? ovaries remain  ? CHOLECYSTECTOMY    ? ?Patient Active Problem List  ? Diagnosis Date Noted  ? Right hand weakness 06/19/2021  ? Hyperlipidemia 03/05/2021  ? Prediabetes 05/13/2020  ? Obesity (BMI 35.0-39.9 without comorbidity) 08/27/2016  ? Essential hypertension 06/12/2014  ? GERD (gastroesophageal reflux disease) 06/12/2014  ? ? ?ONSET DATE: Feb. 2023 ? ?REFERRING DIAG: Right hand weakness. Possible Tenosynovitis of the wrist.  ? ?THERAPY DIAG:  ?Pain in joint of right hand ? ?Other symptoms and signs involving the musculoskeletal system ? ?Localized edema ? ?SUBJECTIVE:  ? ?SUBJECTIVE STATEMENT: ?S: It started to get a lot worse in February when I had to help my husband more. Now my left hand is starting to give me trouble too.  ?Pt accompanied by: self ? ?PERTINENT HISTORY:  ?  ?From recent MD appointment: 61 year old right-hand-dominant female who presents with weakness and mild pain in the right hand.  Is been going on for some months now.  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  She describes overall hand  weakness that makes some of her daily tasks difficult.  She is able to make a full fist but is quite weak compared to the contralateral side.  She also notes some atraumatic swelling of the middle finger.  The swelling seems to be intermittent.  She has tried a prednisone taper which provided some relief but not complete.  She is also taking diclofenac twice a day currently.   ? 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 In February 2023, she was required to provide more assistance due to an UTI. Husband has a hospital bed with short length bed rails (hand crank), hoyer lift, dressed at bed level, BSC, wheelchair, tub bench (only does showers if she has help; otherwise it's a sponge bath in bed). Sounds like he's still requiring more of max assist overall for ADL tasks.   ? ?PRECAUTIONS: Other: follow protocol located in media tab  ? ?WEIGHT BEARING RESTRICTIONS No ? ?PAIN:  ?Are you having pain? Yes: NPRS scale: 4/10 ?Pain location: right hand wrist up to middle finger ?Pain description: throbbing  ?Aggravating factors: Any pulling and gripping.  ?Relieving factors: unknown ? ?FALLS: Has patient fallen in last 6 months? No ? ?LIVING ENVIRONMENT: ?Lives with: lives with their spouse ? ? ?PLOF: Independent ? ?PATIENT GOALS To decrease pain and be able to use her right hand normally again. ? ?OBJECTIVE:  ? ?HAND  DOMINANCE: Right ? ?ADLs: ?Overall ADLs: Difficulty with any type of gripping, pulling or pushing task.  ? ? ?FUNCTIONAL OUTCOME MEASURES: ?Quick Dash: Complete next session ? ?UE ROM    ? ?Active ROM Right ?07/08/2021  ?Shoulder flexion   ?Shoulder abduction   ?Shoulder adduction   ?Shoulder extension   ?Shoulder internal rotation   ?Shoulder external rotation   ?Elbow flexion WNL  ?Elbow extension WNL  ?Wrist flexion 60  ?Wrist extension 40  ?Wrist ulnar deviation 42  ?Wrist radial deviation 18  ?Wrist pronation 90  ?Wrist supination 90  ?(Blank rows = not tested) ? ? ? ? ?UE MMT:     ? ?MMT Right ?07/08/2021  ?Shoulder flexion   ?Shoulder abduction   ?Shoulder adduction   ?Shoulder extension   ?Shoulder internal rotation   ?Shoulder external rotation   ?Middle trapezius   ?Lower trapezius   ?Elbow flexion   ?Elbow extension   ?Wrist flexion   ?Wrist extension   ?Wrist ulnar deviation   ?Wrist radial deviation   ?Wrist pronation   ?Wrist supination   ?(Blank rows = not tested) ? ?HAND FUNCTION: ?Grip strength: Right: 20 lbs; Left: 60 lbs, Lateral pinch: Right: 10 lbs, Left: 14 lbs, and 3 point pinch: Right: 8 lbs, Left: 10 lbs ? ?COORDINATION: ?9 Hole Peg test: Right:   sec; Left:   sec (test next session) ? ?SENSATION: ?Reports that hot water makes her hand tingle and be numb.  ? ?EDEMA:  ?20.4cm right MCP joint composite ?19.5 cm left MCP joint composite ?6.9 cm Base of right middle finger ?6.0 cm Base of left middle finger ?6.5 cm Base of right index finger ?6.0 cm Base of left index finger ? ? ?COGNITION: ?Overall cognitive status: Within functional limits for tasks assessed ? ?OBSERVATIONS: Positive Finkelstein's test. ? ? ? ?TODAY'S TREATMENT:  ?  ? ? ?PATIENT EDUCATION: ?Education details: Discussed treatment plan. Provided education handout for dequervains ?Person educated: Patient ?Education method: Explanation and Handouts ?Education comprehension: verbalized understanding ? ? ?HOME EXERCISE PROGRAM: ?Will be provided at first treatment ? ?GOALS: ? ? ?SHORT TERM GOALS: Target date: 08/05/2021 ? ?Patient will be educated and verbalize understanding of HEP in order to facilitate her progress in therapy and begin to increase the functional use of her right hand during daily tasks.  ?Baseline: ?Goal status: INITIAL ? ?2.  Patient will be educated and verbalize activity modifications that she is implementing during daily tasks and when providing care for her husband in order to help decrease the overuse of the tendons in her right wrist.  ?Baseline:  ?Goal status: INITIAL ? ?3.  Patient will  report a decrease in pain level in her right wrist/forearm approximately 3/10 or less while completing daily tasks at home.  ?Baseline:  ?Goal status: INITIAL ? ?4.  Patient will demonstrate an improved grip and pinch strength in her right hand to approximately 75% of average norms for age.  ?Baseline:  ?Goal status: INITIAL ? ? ?ASSESSMENT: ? ?CLINICAL IMPRESSION: ?Patient is a 61 y.o. female who was seen today for occupational therapy evaluation for right hand decreased strength with deficits presenting as Dequervain's tenosynovitis causing increased pain and weakness resulting in severe difficulty performing normal care taking activities for Husband, such as pushing, pulling, gripping.  ? ?PERFORMANCE DEFICITS in functional skills including ADLs, IADLs, ROM, strength, pain, fascial restrictions, decreased knowledge of precautions, decreased knowledge of use of DME, and UE functional use. ? ?IMPAIRMENTS are limiting patient from ADLs, IADLs, and  caring for Husband .  ? ?COMORBIDITIES may have co-morbidities  that affects occupational performance. Patient will benefit from skilled OT to address above impairments and improve overall function. ? ?MODIFICATION OR ASSISTANCE TO COMPLETE EVALUATION: Min-Moderate modification of tasks or assist with assess necessary to complete an evaluation. ? ?OT OCCUPATIONAL PROFILE AND HISTORY: Detailed assessment: Review of records and additional review of physical, cognitive, psychosocial history related to current functional performance. ? ?CLINICAL DECISION MAKING: Moderate - several treatment options, min-mod task modification necessary ? ?REHAB POTENTIAL: Good ? ?EVALUATION COMPLEXITY: Moderate ? ? ? ? ? ?PLAN: ?OT FREQUENCY: 2x/week ? ?OT DURATION: 4 weeks ? ?PLANNED INTERVENTIONS: self care/ADL training, therapeutic exercise, therapeutic activity, neuromuscular re-education, manual therapy, passive range of motion, splinting, electrical stimulation, ultrasound, iontophoresis,  moist heat, cryotherapy, patient/family education, coping strategies training, and DME and/or AE instructions ? ? ? ?CONSULTED AND AGREED WITH PLAN OF CARE: Patient ? ?PLAN FOR NEXT SESSION: Discuss protocol.

## 2021-07-22 ENCOUNTER — Ambulatory Visit (HOSPITAL_COMMUNITY): Payer: 59 | Attending: Orthopedic Surgery

## 2021-07-22 ENCOUNTER — Encounter (HOSPITAL_COMMUNITY): Payer: Self-pay

## 2021-07-22 DIAGNOSIS — R600 Localized edema: Secondary | ICD-10-CM | POA: Diagnosis present

## 2021-07-22 DIAGNOSIS — M25541 Pain in joints of right hand: Secondary | ICD-10-CM | POA: Diagnosis not present

## 2021-07-22 DIAGNOSIS — R29898 Other symptoms and signs involving the musculoskeletal system: Secondary | ICD-10-CM | POA: Diagnosis present

## 2021-07-22 NOTE — Therapy (Signed)
?OUTPATIENT OCCUPATIONAL THERAPY TREATMENT NOTE ? ? ?Patient Name: Jackie Ayala ?MRN: [id number] ?DOB:1960/10/14, 61 y.o., female ?Today's Date: 07/22/2021 ? ?PCP: Sharion Balloon, FNP ?REFERRING PROVIDER: Sherilyn Cooter, MD ? ?END OF SESSION:  ? OT End of Session - 07/22/21 1239   ? ? Visit Number 2   ? Number of Visits 8   ? Date for OT Re-Evaluation 08/05/21   ? Authorization Type Generic [employer]   ? Authorization Time Period no auth, co copay, no visit limit   ? OT Start Time 1040   Checked in late  ? OT Stop Time 1110   ? OT Time Calculation (min) 30 min   ? Activity Tolerance Patient tolerated treatment well   ? Behavior During Therapy [facility] for tasks assessed/performed   ? ?  ?  ? ?  ? ? ?Past Medical History:  ?Diagnosis Date  ? Hypertension   ? ?Past Surgical History:  ?Procedure Laterality Date  ? ABDOMINAL HYSTERECTOMY  2004  ? ovaries remain  ? CHOLECYSTECTOMY    ? ?Patient Active Problem List  ? Diagnosis Date Noted  ? Right hand weakness 06/19/2021  ? Hyperlipidemia 03/05/2021  ? Prediabetes 05/13/2020  ? Obesity (BMI 35.0-39.9 without comorbidity) 08/27/2016  ? Essential hypertension 06/12/2014  ? GERD (gastroesophageal reflux disease) 06/12/2014  ? ? ?ONSET DATE: Feb. 2023 ? ?REFERRING DIAG: Right hand weakness. Possible Tenosynovitis of the wrist.  ? ?THERAPY DIAG:  ?Pain in joint of right hand ? ?Localized edema ? ?Other symptoms and signs involving the musculoskeletal system ? ? ?PERTINENT HISTORY: From recent MD appointment: 61 year old right-hand-dominant female who presents with weakness and mild pain in the right hand.  Is been going on for some months now.  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  She describes overall hand weakness that makes some of her daily tasks difficult.  She is able to make a full fist but is quite weak compared to the contralateral side.  She also notes some atraumatic  swelling of the middle finger.  The swelling seems to be intermittent.  She has tried a prednisone taper which provided some relief but not complete.  She is also taking diclofenac twice a day currently.   ?  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 In February 2023, she was required to provide more assistance due to an UTI. Husband has a hospital bed with short length bed rails (hand crank), hoyer lift, dressed at bed level, BSC, wheelchair, tub bench (only does showers if she has help; otherwise it's a sponge bath in bed). Sounds like he's still requiring more of max assist overall for ADL tasks.   ? ?PRECAUTIONS: Other: follow protocol located in media tab  ? ?SUBJECTIVE: S: I've been trying not use over use it and I think it's helping. ? ?PAIN:  ?Are you having pain? Yes: NPRS scale: 4/10 ?Pain location: right hand wrist up to middle finger ?Pain description: throbbing, sore ?Aggravating factors: any pulling and gripping ?Relieving factors: unknown ? ? ? ? ?OBJECTIVE:  ?UE ROM    ?  ?Active ROM Right ?07/08/2021  ?Elbow flexion WNL  ?Elbow extension WNL  ?Wrist flexion 60  ?Wrist extension 40  ?Wrist ulnar deviation 42  ?Wrist radial deviation 18  ?Wrist pronation 90  ?Wrist supination 90  ?(Blank rows = not tested) ?  ?  ?  ?  ?UE  MMT:    ?  ?MMT Right ?07/08/2021  ?Wrist flexion    ?Wrist extension    ?Wrist ulnar deviation    ?Wrist radial deviation    ?Wrist pronation    ?Wrist supination    ?(Blank rows = not tested) ?  ?HAND FUNCTION: ?Grip strength: Right: 20 lbs; Left: 60 lbs, Lateral pinch: Right: 10 lbs, Left: 14 lbs, and 3 point pinch: Right: 8 lbs, Left: 10 lbs ?  ?COORDINATION: ?9 Hole Peg test: Right:   sec; Left:   sec (test next session) ?  ?SENSATION: ?Reports that hot water makes her hand tingle and be numb.  ?  ?EDEMA:  ?20.4cm right MCP joint composite ?19.5 cm left MCP joint composite ?6.9 cm Base of right middle finger ?6.0 cm Base of left middle finger ?6.5 cm Base of  right index finger ?6.0 cm Base of left index finger ? ? ? ? ?GOALS: ?  ?  ?SHORT TERM GOALS: Target date: 08/05/2021 ?  ?Patient will be educated and verbalize understanding of HEP in order to facilitate her progress in therapy and begin to increase the functional use of her right hand during daily tasks.  ?Baseline: ?Goal status: on-going ?  ?2.  Patient will be educated and verbalize activity modifications that she is implementing during daily tasks and when providing care for her husband in order to help decrease the overuse of the tendons in her right wrist.  ?Baseline:  ?Goal status: on-going ?  ?3.  Patient will report a decrease in pain level in her right wrist/forearm approximately 3/10 or less while completing daily tasks at home.  ?Baseline:  ?Goal status: on-going ?  ?4.  Patient will demonstrate an improved grip and pinch strength in her right hand to approximately 75% of average norms for age.  ?Baseline:  ?Goal status: on-going ? ? ? ? ?PATIENT EDUCATION: ?Education details: Activity modification, self massage, use of heat. Reviewed standard protocol and recommendation for custom orthosis. Provided Dycem to help with opening jars, bottle, and containers. Discussed adaptive technique to open pull tab cans (veggies) using a knife.  ?Person educated: Patient ?Education method: Explanation and Handouts ?Education comprehension: verbalized understanding ?  ?  ?HOME EXERCISE PROGRAM: ?5/2: gentle massage ? ? ? ? ?TODAY'S TREATMENT: ?07/22/21 ?- Moist heat: applied to right hand and palm 5' ?- Manual therapy: completed prior to exercises. Soft tissue mobilization and myofascial release completed to radial aspect of right forearm, wrist, and thumb. Also completed to palm of hand and lumbrical muscles on either side of the of middle finger MCP joint. ? ? ?CLINICAL IMPRESSION: A: Initiated moist heat prior to gentle manual techniques to right forearm, wrist, thumb, and hand. Discussed typical treatment protocol  for Dequervain's with recommendation of fabricating a custom thumb spica splint. Pt reports left hand is exhibiting the same symptoms as the right and would like therapy to work on that one/make a splint. Order will be sent to Dr. Tempie Donning asking to adding the left hand/wrist to our treatment. Provided education on activity modifications.  ? ?PLAN: ?OT FREQUENCY: 2x/week ?  ?OT DURATION: 4 weeks ?  ?PLANNED INTERVENTIONS: self care/ADL training, therapeutic exercise, therapeutic activity, neuromuscular re-education, manual therapy, passive range of motion, splinting, electrical stimulation, ultrasound, iontophoresis, moist heat, cryotherapy, patient/family education, coping strategies training, and DME and/or AE instructions ?  ?  ?  ?CONSULTED AND AGREED WITH PLAN OF CARE: Patient ?  ?PLAN FOR NEXT SESSION: Fabricate right hand thumb spica splint. Follow on  order faxed to Dr. Tempie Donning to add left hand. ? ?Ailene Ravel, OTR/L,CBIS  ?[phone] ? ?07/22/2021, 12:58 PM ? ?  ? ?  ?

## 2021-07-22 NOTE — Patient Instructions (Signed)
Light massage  along the radial side of the forearm, wrist and thumb area is encouraged 2-3 times a day promote circulation and healing to the underlying soft tissue structures.  ? ?If able : use heating pad for 5 minutes to hand/wrist prior to massage.  ? ?AVOID: repetitive pinching activities or activities pinching/gripping with the wrist in flexion for long periods of time.  ?

## 2021-07-30 ENCOUNTER — Ambulatory Visit (HOSPITAL_COMMUNITY): Payer: 59

## 2021-07-30 ENCOUNTER — Encounter (HOSPITAL_COMMUNITY): Payer: Self-pay

## 2021-07-30 DIAGNOSIS — R29898 Other symptoms and signs involving the musculoskeletal system: Secondary | ICD-10-CM

## 2021-07-30 DIAGNOSIS — M25541 Pain in joints of right hand: Secondary | ICD-10-CM

## 2021-07-30 DIAGNOSIS — R600 Localized edema: Secondary | ICD-10-CM

## 2021-07-30 NOTE — Therapy (Signed)
?OUTPATIENT OCCUPATIONAL THERAPY TREATMENT NOTE ? ? ?Patient Name: Jackie Ayala ?MRN: [id number] ?DOB:1961/02/05, 61 y.o., female ?Today's Date: 07/30/2021 ? ?PCP: Sharion Balloon, FNP ?REFERRING PROVIDER: Sherilyn Cooter, MD ? ?END OF SESSION:  ? OT End of Session - 07/30/21 0911   ? ? Visit Number 3   ? Number of Visits 8   ? Date for OT Re-Evaluation 08/05/21   ? Authorization Type Generic [employer]   ? Authorization Time Period no auth, co copay, no visit limit   ? OT Start Time [phone]   ? OT Stop Time 0815   ? OT Time Calculation (min) 38 min   ? Activity Tolerance Patient tolerated treatment well   ? Behavior During Therapy [facility] for tasks assessed/performed   ? ?  ?  ? ?  ? ? ? ?Past Medical History:  ?Diagnosis Date  ? Hypertension   ? ?Past Surgical History:  ?Procedure Laterality Date  ? ABDOMINAL HYSTERECTOMY  2004  ? ovaries remain  ? CHOLECYSTECTOMY    ? ?Patient Active Problem List  ? Diagnosis Date Noted  ? Right hand weakness 06/19/2021  ? Hyperlipidemia 03/05/2021  ? Prediabetes 05/13/2020  ? Obesity (BMI 35.0-39.9 without comorbidity) 08/27/2016  ? Essential hypertension 06/12/2014  ? GERD (gastroesophageal reflux disease) 06/12/2014  ? ? ?ONSET DATE: Feb. 2023 ? ?REFERRING DIAG: Right hand weakness. Possible Tenosynovitis of the wrist.  ? ?THERAPY DIAG:  ?Pain in joint of right hand ? ?Other symptoms and signs involving the musculoskeletal system ? ?Localized edema ? ? ?PERTINENT HISTORY: From recent MD appointment: 61 year old right-hand-dominant female who presents with weakness and mild pain in the right hand.  Is been going on for some months now.  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  She describes overall hand weakness that makes some of her daily tasks difficult.  She is able to make a full fist but is quite weak compared to the contralateral side.  She also notes some atraumatic swelling of  the middle finger.  The swelling seems to be intermittent.  She has tried a prednisone taper which provided some relief but not complete.  She is also taking diclofenac twice a day currently.   ?  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 In February 2023, she was required to provide more assistance due to an UTI. Husband has a hospital bed with short length bed rails (hand crank), hoyer lift, dressed at bed level, BSC, wheelchair, tub bench (only does showers if she has help; otherwise it's a sponge bath in bed). Sounds like he's still requiring more of max assist overall for ADL tasks.   ? ?PRECAUTIONS: Other: follow protocol located in media tab  ? ?SUBJECTIVE: S: It's sore today. I tweaked it the other day. ? ?PAIN:  ?Are you having pain? Yes: NPRS scale: 4/10 ?Pain location: right hand wrist up to middle finger ?Pain description: throbbing, sore ?Aggravating factors: any pulling and gripping ?Relieving factors: unknown ? ? ? ? ?OBJECTIVE:  ?UE ROM    ?  ?Active ROM Right ?07/08/2021  ?Elbow flexion WNL  ?Elbow extension WNL  ?Wrist flexion 60  ?Wrist extension 40  ?Wrist ulnar deviation 42  ?Wrist radial deviation 18  ?Wrist pronation 90  ?Wrist supination 90  ?(Blank rows = not tested) ?  ?  ?  ?  ?UE MMT:    ?  ?MMT  Right ?07/08/2021  ?Wrist flexion    ?Wrist extension    ?Wrist ulnar deviation    ?Wrist radial deviation    ?Wrist pronation    ?Wrist supination    ?(Blank rows = not tested) ?  ?HAND FUNCTION: ?Grip strength: Right: 20 lbs; Left: 60 lbs, Lateral pinch: Right: 10 lbs, Left: 14 lbs, and 3 point pinch: Right: 8 lbs, Left: 10 lbs ?  ?COORDINATION: ?9 Hole Peg test: Right:   sec; Left:   sec (test next session) ?  ?SENSATION: ?Reports that hot water makes her hand tingle and be numb.  ?  ?EDEMA:  ?20.4cm right MCP joint composite ?19.5 cm left MCP joint composite ?6.9 cm Base of right middle finger ?6.0 cm Base of left middle finger ?6.5 cm Base of right index finger ?6.0 cm Base  of left index finger ? ? ? ? ?GOALS: ?  ?  ?SHORT TERM GOALS: Target date: 08/05/2021 ?  ?Patient will be educated and verbalize understanding of HEP in order to facilitate her progress in therapy and begin to increase the functional use of her right hand during daily tasks.  ?Baseline: ?Goal status: on-going ?  ?2.  Patient will be educated and verbalize activity modifications that she is implementing during daily tasks and when providing care for her husband in order to help decrease the overuse of the tendons in her right wrist.  ?Baseline:  ?Goal status: on-going ?  ?3.  Patient will report a decrease in pain level in her right wrist/forearm approximately 3/10 or less while completing daily tasks at home.  ?Baseline:  ?Goal status: on-going ?  ?4.  Patient will demonstrate an improved grip and pinch strength in her right hand to approximately 75% of average norms for age.  ?Baseline:  ?Goal status: on-going ? ? ? ? ?PATIENT EDUCATION: ?Education details: Splint education (donning, doffing, care, wearing schedule, precautions)  ?Person educated: Patient ?Education method: Explanation and Handouts ?Education comprehension: verbalized understanding ?  ?  ?HOME EXERCISE PROGRAM: ?5/2: gentle massage ? ? ? ? ?TODAY'S TREATMENT: ?07/30/21 ?- Splinting: Right hand thumb spica splint fabrication, volar base covering 1/2 of forearm. Two 2 inch straps to secure forearm, 1 one inch strap at dorsal aspect of hand. 3 sockettes provided. ? ?07/22/21 ?- Moist heat: applied to right hand and palm 5' ?- Manual therapy: completed prior to exercises. Soft tissue mobilization and myofascial release completed to radial aspect of right forearm, wrist, and thumb. Also completed to palm of hand and lumbrical muscles on either side of the of middle finger MCP joint. ? ? ?CLINICAL IMPRESSION: A: Custom fabricated right hand thumb spica splint completed today to provide wrist and CMC joint stability. Education provided (see above). Patient  verbalized understanding. No return order has been received yet from Dr. Tempie Donning regarding her left wrist.  ?PLAN: ?OT FREQUENCY: 2x/week ?  ?OT DURATION: 4 weeks ?  ?PLANNED INTERVENTIONS: self care/ADL training, therapeutic exercise, therapeutic activity, neuromuscular re-education, manual therapy, passive range of motion, splinting, electrical stimulation, ultrasound, iontophoresis, moist heat, cryotherapy, patient/family education, coping strategies training, and DME and/or AE instructions ?  ?  ?  ?CONSULTED AND AGREED WITH PLAN OF CARE: Patient ?  ?PLAN FOR NEXT SESSION:  Begin next stage of protocol. Follow on order faxed to Dr. Tempie Donning to add left hand. ? ?Ailene Ravel, OTR/L,CBIS  ?[phone] ? ?07/30/2021, 9:12 AM ? ?  ? ?  ?

## 2021-07-30 NOTE — Patient Instructions (Signed)
Your Splint ?This splint should initially be fitted by a healthcare practitioner.  The healthcare practitioner is responsible for providing wearing instructions and precautions to the patient, other healthcare practitioners and care provider involved in the patient's care.  This splint was custom made for you. Please read the following instructions to learn about wearing and caring for your splint. ? ?Precautions ?Should your splint cause any of the following problems, remove the splint immediately and contact your therapist/physician. ?Swelling ?Severe Pain ?Pressure Areas ?Stiffness ?Numbness ? ?Do not wear your splint while operating machinery unless it has been fabricated for that purpose. ? ?When To Wear Your Splint ?Where your splint according to your therapist/physician instructions. ?Daytime (during physical activity) ? ?Care and Cleaning of Your Splint ?Keep your splint away from open flames. ?Your splint will lose its shape in temperatures over 135 degrees Farenheit, ( in car windows, near radiators, ovens or in hot water).  Never make any adjustments to your splint, if the splint needs adjusting remove it and make an appointment to see your therapist. ?Your splint, including the cushion liner may be cleaned with soap and lukewarm water.  Do not immerse in hot water over 135 degrees Farenheit. ?Straps may be washed with soap and water, but do not moisten the self-adhesive portion. ?For ink or hard to remove spots use a scouring cleanser which contains chlorine.  Rinse the splint thoroughly after using chlorine cleanser. ? ?

## 2021-07-31 ENCOUNTER — Encounter: Payer: Self-pay | Admitting: Family Medicine

## 2021-07-31 ENCOUNTER — Ambulatory Visit (INDEPENDENT_AMBULATORY_CARE_PROVIDER_SITE_OTHER): Payer: 59 | Admitting: Family Medicine

## 2021-07-31 ENCOUNTER — Telehealth: Payer: Self-pay | Admitting: Family

## 2021-07-31 VITALS — BP 137/75 | HR 58 | Temp 98.1°F | Ht 62.0 in | Wt 198.2 lb

## 2021-07-31 DIAGNOSIS — M2550 Pain in unspecified joint: Secondary | ICD-10-CM

## 2021-07-31 MED ORDER — PREDNISONE 10 MG PO TABS: ORAL_TABLET | ORAL | 0 refills | Status: DC

## 2021-07-31 NOTE — Telephone Encounter (Signed)
It is soothing and relieves pain. Doesn't seem to speed healing. ?

## 2021-07-31 NOTE — Progress Notes (Signed)
? ?Subjective:  ?Patient ID: Jackie Ayala, female    DOB: 1961-01-30  Age: 61 y.o. MRN: [id number] ? ?CC: No chief complaint on file. ? ? ?HPI ?Jackie Ayala presents for right hand pain persisting. Went to therapy and had a thumb spica made. Hand surgeon felt it was Thumb tendon.  ? ?Feels off. Started two DAYS AGO. Two months ago noted shoulder neck knee and hips hurting. Some days can hardly move.  ? 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  ? ? ?  05/06/2021  ?  3:22 PM 12/03/2020  ?  9:32 AM 08/26/2020  ? 11:50 AM  ?Depression screen PHQ 2/9  ?Decreased Interest 0 0 0  ?Down, Depressed, Hopeless 0 0 0  ?PHQ - 2 Score 0 0 0  ?Altered sleeping 0 0   ?Tired, decreased energy 0 0   ?Change in appetite 0 0   ?Feeling bad or failure about yourself  0 0   ?Trouble concentrating 0 0   ?Moving slowly or fidgety/restless 0 0   ?Suicidal thoughts 0 0   ?PHQ-9 Score 0 0   ?Difficult doing work/chores Not difficult at all Not difficult at all   ? ? ?History ?Jackie Ayala has a past medical history of Hypertension.  ? ?She has a past surgical history that includes Abdominal hysterectomy (2004) and Cholecystectomy.  ? ?Her family history includes Breast cancer (age of onset: 11) in her mother; COPD in her mother; Diabetes in her mother; Heart disease in her father; Hypertension in her mother.She reports that she has never smoked. She has never used smokeless tobacco. She reports that she does not drink alcohol and does not use drugs. ? ? ? ?ROS ?Review of Systems  ?Constitutional: Negative.   ?HENT: Negative.    ?Eyes:  Negative for visual disturbance.  ?Respiratory:  Negative for shortness of breath.   ?Cardiovascular:  Negative for chest pain.  ?Gastrointestinal:  Negative for abdominal pain.  ?Musculoskeletal:  Negative for arthralgias.  ? ?Objective:  ?BP 137/75   Pulse (!) 58   Temp 98.1 ?F (36.7 ?C)   Ht $R'5\' 2"'ic$  (1.575 m)   Wt 198 lb 3.2 oz (89.9 kg)   SpO2 98%   BMI 36.25 kg/m?  ? ?BP  Readings from Last 3 Encounters:  ?07/31/21 137/75  ?06/10/21 (!) 164/82  ?05/06/21 120/63  ? ? ?Wt Readings from Last 3 Encounters:  ?07/31/21 198 lb 3.2 oz (89.9 kg)  ?06/10/21 196 lb (88.9 kg)  ?05/06/21 194 lb (88 kg)  ? ? ? ?Physical Exam ?Constitutional:   ?   General: She is not in acute distress. ?   Appearance: She is well-developed.  ?Cardiovascular:  ?   Rate and Rhythm: Normal rate and regular rhythm.  ?Pulmonary:  ?   Breath sounds: Normal breath sounds.  ?Musculoskeletal:     ?   General: Normal range of motion.  ?Skin: ?   General: Skin is warm and dry.  ?Neurological:  ?   Mental Status: She is alert and oriented to person, place, and time.  ? ? ? ? ?Assessment & Plan:  ? ?Diagnoses and all orders for this visit: ? ?Arthralgia, unspecified joint ?-     Anti-CCP Ab, IgG + IgA (RDL) ?-     CBC with Differential/Platelet ?-     CMP14+EGFR ?-     Sedimentation rate ? ?Other orders ?-     predniSONE (DELTASONE) 10 MG tablet; Take  5 daily for 3 days followed by 4,3,2 and 1 for 3 days each. ? ? ? ? ? ? ?I am having [facility] start on predniSONE. I am also having her maintain her Cholecalciferol (VITAMIN D-3 PO), Omega-3, Zinc, cetirizine, atenolol, lisinopril, omeprazole, rosuvastatin, diclofenac, and diclofenac Sodium. ? ?Allergies as of 07/31/2021   ? ?   Reactions  ? Penicillins   ? Sulfa Antibiotics   ? ?  ? ?  ?Medication List  ?  ? ?  ? Accurate as of Jul 31, 2021  9:23 PM. If you have any questions, ask your nurse or doctor.  ?  ?  ? ?  ? ?atenolol 50 MG tablet ?Commonly known as: TENORMIN ?One tab by mouth every day. Needs appointment for future refills. ?  ?cetirizine 10 MG tablet ?Commonly known as: ZYRTEC ?Take 1 tablet (10 mg total) by mouth daily. ?  ?diclofenac 75 MG EC tablet ?Commonly known as: VOLTAREN ?Take 1 tablet (75 mg total) by mouth 2 (two) times daily. ?  ?diclofenac Sodium 1 % Gel ?Commonly known as: VOLTAREN ?Apply 2 g topically 4 (four) times daily. ?  ?lisinopril 20 MG  tablet ?Commonly known as: ZESTRIL ?Take 1 tablet (20 mg total) by mouth daily. ?  ?Omega-3 1000 MG Caps ?Take 1 capsule by mouth daily. ?  ?omeprazole 40 MG capsule ?Commonly known as: PRILOSEC ?Take 1 capsule (40 mg total) by mouth daily. (Needs to be seen before next refill) ?  ?predniSONE 10 MG tablet ?Commonly known as: DELTASONE ?Take 5 daily for 3 days followed by 4,3,2 and 1 for 3 days each. ?Started by: Claretta Fraise, MD ?  ?rosuvastatin 5 MG tablet ?Commonly known as: Crestor ?Take 1 tablet (5 mg total) by mouth daily. ?  ?VITAMIN D-3 PO ?Take by mouth. 1000 IUD 1-2 capsules daily ?  ?Zinc 100 MG Tabs ?Take by mouth. ?  ? ?  ? ? ? ?Follow-up: Return if symptoms worsen or fail to improve. ? ?Claretta Fraise, M.D. ?

## 2021-08-01 ENCOUNTER — Ambulatory Visit (HOSPITAL_COMMUNITY): Payer: 59

## 2021-08-01 ENCOUNTER — Telehealth: Payer: Self-pay | Admitting: Family

## 2021-08-01 ENCOUNTER — Encounter (HOSPITAL_COMMUNITY): Payer: Self-pay

## 2021-08-01 DIAGNOSIS — R29898 Other symptoms and signs involving the musculoskeletal system: Secondary | ICD-10-CM

## 2021-08-01 DIAGNOSIS — M25541 Pain in joints of right hand: Secondary | ICD-10-CM

## 2021-08-01 DIAGNOSIS — R600 Localized edema: Secondary | ICD-10-CM

## 2021-08-01 NOTE — Telephone Encounter (Signed)
LM to RC  

## 2021-08-01 NOTE — Therapy (Signed)
?OUTPATIENT OCCUPATIONAL THERAPY TREATMENT NOTE ? ? ?Patient Name: Jackie Ayala ?MRN: [id number] ?DOB:1960/05/18, 61 y.o., female ?Today's Date: 08/01/2021 ? ?PCP: Sharion Balloon, FNP ?REFERRING PROVIDER: Sherilyn Cooter, MD ? ?END OF SESSION:  ? OT End of Session - 08/01/21 0913   ? ? Visit Number 4   ? Number of Visits 8   ? Date for OT Re-Evaluation 08/05/21   ? Authorization Type Generic [employer]   ? Authorization Time Period no auth, co copay, no visit limit   ? OT Start Time 0815   ? OT Stop Time 0855   ? OT Time Calculation (min) 40 min   ? Activity Tolerance Patient tolerated treatment well   ? Behavior During Therapy [facility] for tasks assessed/performed   ? ?  ?  ? ?  ? ? ? ? ?Past Medical History:  ?Diagnosis Date  ? Hypertension   ? ?Past Surgical History:  ?Procedure Laterality Date  ? ABDOMINAL HYSTERECTOMY  2004  ? ovaries remain  ? CHOLECYSTECTOMY    ? ?Patient Active Problem List  ? Diagnosis Date Noted  ? Right hand weakness 06/19/2021  ? Hyperlipidemia 03/05/2021  ? Prediabetes 05/13/2020  ? Obesity (BMI 35.0-39.9 without comorbidity) 08/27/2016  ? Essential hypertension 06/12/2014  ? GERD (gastroesophageal reflux disease) 06/12/2014  ? ? ?ONSET DATE: Feb. 2023 ? ?REFERRING DIAG: Right hand weakness. Possible Tenosynovitis of the wrist.  ? ?THERAPY DIAG:  ?Pain in joint of right hand ? ?Other symptoms and signs involving the musculoskeletal system ? ?Localized edema ? ? ?PERTINENT HISTORY: From recent MD appointment: 61 year old right-hand-dominant female who presents with weakness and mild pain in the right hand.  Is been going on for some months now.  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  She describes overall hand weakness that makes some of her daily tasks difficult.  She is able to make a full fist but is quite weak compared to the contralateral side.  She also notes some atraumatic swelling of  the middle finger.  The swelling seems to be intermittent.  She has tried a prednisone taper which provided some relief but not complete.  She is also taking diclofenac twice a day currently.   ?  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 In February 2023, she was required to provide more assistance due to an UTI. Husband has a hospital bed with short length bed rails (hand crank), hoyer lift, dressed at bed level, BSC, wheelchair, tub bench (only does showers if she has help; otherwise it's a sponge bath in bed). Sounds like he's still requiring more of max assist overall for ADL tasks.   ? ?PRECAUTIONS: Other: follow protocol located in media tab  ? ?SUBJECTIVE: S: I like the splint. I just need the straps a little longer to be able to grab them easier. ? ?PAIN:  ?Are you having pain? Yes: NPRS scale: 4/10 ?Pain location: right hand wrist up to middle finger ?Pain description: throbbing, sore ?Aggravating factors: any pulling and gripping ?Relieving factors: unknown ? ? ? ? ?OBJECTIVE:  ?UE ROM    ?  ?Active ROM Right ?07/08/2021  ?Elbow flexion WNL  ?Elbow extension WNL  ?Wrist flexion 60  ?Wrist extension 40  ?Wrist ulnar deviation 42  ?Wrist radial deviation 18  ?Wrist pronation 90  ?Wrist supination 90  ?(Blank rows = not tested) ?  ?  ?  ?  ?  UE MMT:    ?  ?MMT Right ?07/08/2021  ?Wrist flexion    ?Wrist extension    ?Wrist ulnar deviation    ?Wrist radial deviation    ?Wrist pronation    ?Wrist supination    ?(Blank rows = not tested) ?  ?HAND FUNCTION: ?Grip strength: Right: 20 lbs; Left: 60 lbs, Lateral pinch: Right: 10 lbs, Left: 14 lbs, and 3 point pinch: Right: 8 lbs, Left: 10 lbs ?  ? ?  ?SENSATION: ?Reports that hot water makes her hand tingle and be numb.  ?  ?EDEMA:  ?20.4cm right MCP joint composite ?19.5 cm left MCP joint composite ?6.9 cm Base of right middle finger ?6.0 cm Base of left middle finger ?6.5 cm Base of right index finger ?6.0 cm Base of left index  finger ? ? ? ? ?GOALS: ?  ?  ?SHORT TERM GOALS: Target date: 08/05/2021 ?  ?Patient will be educated and verbalize understanding of HEP in order to facilitate her progress in therapy and begin to increase the functional use of her right hand during daily tasks.  ?Baseline: ?Goal status: on-going ?  ?2.  Patient will be educated and verbalize activity modifications that she is implementing during daily tasks and when providing care for her husband in order to help decrease the overuse of the tendons in her right wrist.  ?Baseline:  ?Goal status: on-going ?  ?3.  Patient will report a decrease in pain level in her right wrist/forearm approximately 3/10 or less while completing daily tasks at home.  ?Baseline:  ?Goal status: on-going ?  ?4.  Patient will demonstrate an improved grip and pinch strength in her right hand to approximately 75% of average norms for age.  ?Baseline:  ?Goal status: on-going ? ? ? ? ?PATIENT EDUCATION: ?Education details: Updated HEP per protocol with Finkelstein stretch. Provided modifications and discussed form and technique. Provided print out of appropriate splint to purchase for left hand/wrist as we have not received a signed referral back from Dr. Tempie Donning yet.  ?Person educated: patient ?Education method: verbal, demonstration, handout ?Education comprehension: verbalized understanding ?  ?  ?HOME EXERCISE PROGRAM: ?5/2: gentle massage 5/12: Wynn Maudlin Stretch  ? ? ? ? ?TODAY'S TREATMENT: ?08/01/21 ?- Moist heat: applied to right hand and palm 5' ?- Stretching: abductor pollicis longus (APL) and extensor pollicis brevis (EPB). Stretch progression followed for 5x30"  ? ?07/30/21 ?- Splinting: Right hand thumb spica splint fabrication, volar base covering 1/2 of forearm. Two 2 inch straps to secure forearm, 1 one inch strap at dorsal aspect of hand. 3 sockettes provided. ? ?07/22/21 ?- Moist heat: applied to right hand and palm 5' ?- Manual therapy: completed prior to exercises. Soft tissue  mobilization and myofascial release completed to radial aspect of right forearm, wrist, and thumb. Also completed to palm of hand and lumbrical muscles on either side of the of middle finger MCP joint. ? ? ?CLINICAL IMPRESSION: A: Following protocol, completed passive stretching to the APL and EPB focusing on the radial aspect of wrist. Progression of stretching followed as outlined by protocol with patient able to reach final stretch of gentle passive flexion of the thumb CMC and MP joints and combined with ulnar deviation of the wrist aka. Finkelstein stretch. Discussed option of completing as a static stretch or dynamic stretch for HEP. Highlighted being gentle and not forceful with stretch when completing. Pt requested longer splint straps (2 inch) as she is experiencing difficulty with pinch strength to pull strap when  donning and doffing. As we do not have a return signed order to include the left wrist in our treatment plan I provided information to purchase a pre-fabricated thumb spica splint in the mean time. ?  ?PLAN: ?OT FREQUENCY: 2x/week ?  ?OT DURATION: 4 weeks ?  ?PLANNED INTERVENTIONS: self care/ADL training, therapeutic exercise, therapeutic activity, neuromuscular re-education, manual therapy, passive range of motion, splinting, electrical stimulation, ultrasound, iontophoresis, moist heat, cryotherapy, patient/family education, coping strategies training, and DME and/or AE instructions ?  ?  ?  ?CONSULTED AND AGREED WITH PLAN OF CARE: Patient ?  ?PLAN FOR NEXT SESSION:  P: Reassessment and re-cert. Recommend continuing therapy as she is still following treatment protocol and has just started week 3 and still experiencing pain.  ? ? ?Ailene Ravel, OTR/L,CBIS  ?[phone] ? ?08/01/2021, 9:14 AM ? ?  ? ?  ?

## 2021-08-01 NOTE — Telephone Encounter (Signed)
Left detailed message on patient's voice mail.

## 2021-08-01 NOTE — Telephone Encounter (Signed)
Pt rc about this message. Please call back ?

## 2021-08-01 NOTE — Patient Instructions (Addendum)
If you have time to apply moist heat for 5 minutes before stretching to warm up the muscles.  ? ? ?Complete 2-3 times a day.  ? ? ?Choose 1 option: ?1) Complete 5 repetitions. Holding stretch for 30 seconds each. ?OR ?2) Complete 5-10 repetitions. Holding each stretch for 3-5 seconds.  ? ?Arby Barrette ? ?Start with a "thumbs up" position.  Then, with your elbow straight, bring your thumb across your palm and gently wrap your fingers around your thumb.  Bend your wrist toward the pinky side of your hand.  ? ? ? ? ? (Recommended for left wrist while waiting for signed referral) ?

## 2021-08-04 ENCOUNTER — Encounter (HOSPITAL_COMMUNITY): Payer: Self-pay | Admitting: Occupational Therapy

## 2021-08-04 ENCOUNTER — Ambulatory Visit (HOSPITAL_COMMUNITY): Payer: 59 | Admitting: Occupational Therapy

## 2021-08-04 DIAGNOSIS — R29898 Other symptoms and signs involving the musculoskeletal system: Secondary | ICD-10-CM

## 2021-08-04 DIAGNOSIS — M25541 Pain in joints of right hand: Secondary | ICD-10-CM

## 2021-08-04 DIAGNOSIS — R600 Localized edema: Secondary | ICD-10-CM

## 2021-08-04 NOTE — Therapy (Signed)
?OUTPATIENT OCCUPATIONAL THERAPY TREATMENT NOTE ? ? ?Patient Name: Jackie Ayala ?MRN: [id number] ?DOB:12/11/1960, 61 y.o., female ?Today's Date: 08/04/2021 ? ?PCP: Sharion Balloon, FNP ?REFERRING PROVIDER: Sherilyn Cooter, MD ? ?END OF SESSION:  ? OT End of Session - 08/04/21 1620   ? ? Visit Number 5   ? Number of Visits 16   ? Date for OT Re-Evaluation 09/01/2021   ? Authorization Type Generic [employer]   ? Authorization Time Period no auth, co copay, no visit limit   ? OT Start Time 1520   ? OT Stop Time 1606   ? OT Time Calculation (min) 46 min   ? Activity Tolerance Patient tolerated treatment well   ? Behavior During Therapy [facility] for tasks assessed/performed   ? ?  ?  ? ?  ? ? ? ? ? ?Past Medical History:  ?Diagnosis Date  ? Hypertension   ? ?Past Surgical History:  ?Procedure Laterality Date  ? ABDOMINAL HYSTERECTOMY  2004  ? ovaries remain  ? CHOLECYSTECTOMY    ? ?Patient Active Problem List  ? Diagnosis Date Noted  ? Right hand weakness 06/19/2021  ? Hyperlipidemia 03/05/2021  ? Prediabetes 05/13/2020  ? Obesity (BMI 35.0-39.9 without comorbidity) 08/27/2016  ? Essential hypertension 06/12/2014  ? GERD (gastroesophageal reflux disease) 06/12/2014  ? ? ?ONSET DATE: Feb. 2023 ? ?REFERRING DIAG: Right hand weakness. Possible Tenosynovitis of the wrist.  ? ?THERAPY DIAG:  ?Pain in joint of right hand ? ?Other symptoms and signs involving the musculoskeletal system ? ?Localized edema ? ? ?PERTINENT HISTORY: From recent MD appointment: 61 year old right-hand-dominant female who presents with weakness and mild pain in the right hand.  Is been going on for some months now.  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  She describes overall hand weakness that makes some of her daily tasks difficult.  She is able to make a full fist but is quite weak compared to the contralateral side.  She also notes some atraumatic  swelling of the middle finger.  The swelling seems to be intermittent.  She has tried a prednisone taper which provided some relief but not complete.  She is also taking diclofenac twice a day currently.   ?  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 In February 2023, she was required to provide more assistance due to an UTI. Husband has a hospital bed with short length bed rails (hand crank), hoyer lift, dressed at bed level, BSC, wheelchair, tub bench (only does showers if she has help; otherwise it's a sponge bath in bed). Sounds like he's still requiring more of max assist overall for ADL tasks.   ? ?PRECAUTIONS: Other: follow protocol located in media tab  ? ?SUBJECTIVE: S: I asked my daughter to order a splint for the L hand.  ? ?PAIN:  ?Are you having pain? Yes: NPRS scale: 3 or 4/10 ?Pain location: right hand wrist up to middle finger ?Pain description: throbbing, sore ?Aggravating factors: any pulling and gripping ?Relieving factors: steroids taken for swelling ? ? ? ?Progress Note ?Reporting Period 07/08/21 to 08/04/21 ? ?OBJECTIVE:  ?UE ROM    ?  ?Active ROM Right ?07/08/2021 Right  ?08/04/2021  ?Elbow flexion WNL WNL  ?Elbow extension WNL WNL  ?Wrist flexion 60 52  ?Wrist extension 40 42  ?Wrist ulnar deviation 42 34  ?Wrist radial deviation 18 32  ?Wrist pronation  90 90  ?Wrist supination 90 90  ?(Blank rows = not tested) ?  ?  ?  ?  ?UE MMT:    ?  ?MMT Right ?07/08/2021 Right ?08/04/21   ?Wrist flexion     ?Wrist extension     ?Wrist ulnar deviation     ?Wrist radial deviation     ?Wrist pronation     ?Wrist supination     ?(Blank rows = not tested) ?  ?HAND FUNCTION: ?Grip strength: Right: 20 lbs; Left: 60 lbs, Lateral pinch: Right: 10 lbs, Left: 14 lbs, and 3 point pinch: Right: 8 lbs, Left: 10 lbs ?Grip strength 08/04/21: Right: 20 lbs; Left: 38 lbs, Lateral pinch: Right: 11 lbs, Left: 14 lbs, and 3 point pinch: Right: 9 lbs, Left:  11 lbs ?  ? ?  ?SENSATION: ?Reports that hot water  makes her hand tingle and be numb.  ?  ?EDEMA:  ?20.4cm right MCP joint composite ?19.5 cm left MCP joint composite ?6.9 cm Base of right middle finger ?6.0 cm Base of left middle finger ?6.5 cm Base of right index finger ?6.0 cm Base of left index finger ? ?08/04/21 ?20.2 cm right MCP joint composite ?19.7 cm left MCP joint composite ?7.0 cm Base of right middle finger ?6.0 cm Base of left middle finger ?6.5 cm Base of right index finger ?6.5 cm Base of left index finger ? ? ? ? ?GOALS: ?  ?  ?SHORT TERM GOALS: Target date: 08/05/2021 ?  ?Patient will be educated and verbalize understanding of HEP in order to facilitate her progress in therapy and begin to increase the functional use of her right hand during daily tasks.  ?Baseline: Pt still limited in functional use of R hand  ?Goal status: on-going ?  ?2.  Patient will be educated and verbalize activity modifications that she is implementing during daily tasks and when providing care for her husband in order to help decrease the overuse of the tendons in her right wrist.  ?Baseline: 5/15 Using elbow more for tasks to mobilize husband.  ?Goal status: on-going ?  ?3.  Patient will report a decrease in pain level in her right wrist/forearm approximately 3/10 or less while completing daily tasks at home.  ?Baseline: 5/15 pt reporting 3 or 4 pain today. Average pain is about 5 or 6.  ?Goal status: on-going ?  ?4.  Patient will demonstrate an improved grip and pinch strength in her right hand to approximately 75% of average norms for age.  ?Baseline: Pt grip and pinch has either decreased or improved minimally.  ?Goal status: on-going ? ? ? ? ?PATIENT EDUCATION: ?Education details: Educated on plan to continue therapy 2x a week for 4 weeks ?Person educated: patient ?Education method:verbal  ?Education comprehension: verbalized understanding ?  ?  ?HOME EXERCISE PROGRAM: ?5/2: gentle massage 5/12: Wynn Maudlin Stretch  ? ? ? ? ?TODAY'S TREATMENT: ? 08/04/21 ? - Moist heat:  applied to right hand 5' ? -P/ROM: Ulnar deviation 1x 30 seconds ; Ulnar deviation with thumb adduction and wrist extension 1x 30 seconds.  ? ?08/01/21 ?- Moist heat: applied to right hand 5' ?- Stretching: abductor pollicis longus (APL) and extensor pollicis brevis (EPB). Stretch progression followed for 5x30"  ? ?07/30/21 ?- Splinting: Right hand thumb spica splint fabrication, volar base covering 1/2 of forearm. Two 2 inch straps to secure forearm, 1 one inch strap at dorsal aspect of hand. 3 sockettes provided. ? ?07/22/21 ?- Moist heat: applied to right hand and  palm 5' ?- Manual therapy: completed prior to exercises. Soft tissue mobilization and myofascial release completed to radial aspect of right forearm, wrist, and thumb. Also completed to palm of hand and lumbrical muscles on either side of the of middle finger MCP joint. ? ? ? ?See note below for Objective Data and Assessment of Progress/Goals.   ?CLINICAL IMPRESSION: A: Pt re-assessed today showing minor improvements in R pinch strength. Grip remains the same. Most notable improvement in redial deviation for A/ROM. 5' of heat and brief P/ROM completed but minimally due to re-assessment tasks. Pt reports improvement but continued limitations in taking care of her husband and average pain around 5/10 over the past few weeks. Pt goals will continue at this time.  ? ?  ?PLAN: ?OT FREQUENCY: 2x/week ?  ?OT DURATION: 4 weeks ?  ?PLANNED INTERVENTIONS: self care/ADL training, therapeutic exercise, therapeutic activity, neuromuscular re-education, manual therapy, passive range of motion, splinting, electrical stimulation, ultrasound, iontophoresis, moist heat, cryotherapy, patient/family education, coping strategies training, and DME and/or AE instructions ?  ?  ?  ?CONSULTED AND AGREED WITH PLAN OF CARE: Patient ?  ?PLAN FOR NEXT SESSION:  P: Continue moist heat and following protocol for stretching. Continue to add modification as needed and discuss use of L  thumb spica spline that pt bought. Follow up with MD about referral for L hand as well.  ? ?Larey Seat OT, MOT ? ?[phone] ? ?08/04/2021, 4:26 PM ? ?  ? ?  ?

## 2021-08-06 LAB — CMP14+EGFR
ALT: 11 IU/L (ref 0–32)
AST: 12 IU/L (ref 0–40)
Albumin/Globulin Ratio: 1.1 — ABNORMAL LOW (ref 1.2–2.2)
Albumin: 4 g/dL (ref 3.8–4.8)
Alkaline Phosphatase: 87 IU/L (ref 44–121)
BUN/Creatinine Ratio: 14 (ref 12–28)
BUN: 13 mg/dL (ref 8–27)
Bilirubin Total: 0.4 mg/dL (ref 0.0–1.2)
CO2: 24 mmol/L (ref 20–29)
Calcium: 9.6 mg/dL (ref 8.7–10.3)
Chloride: 102 mmol/L (ref 96–106)
Creatinine, Ser: 0.93 mg/dL (ref 0.57–1.00)
Globulin, Total: 3.7 g/dL (ref 1.5–4.5)
Glucose: 101 mg/dL — ABNORMAL HIGH (ref 70–99)
Potassium: 4.5 mmol/L (ref 3.5–5.2)
Sodium: 138 mmol/L (ref 134–144)
Total Protein: 7.7 g/dL (ref 6.0–8.5)
eGFR: 69 mL/min/{1.73_m2} (ref >59)

## 2021-08-06 LAB — CBC WITH DIFFERENTIAL/PLATELET
Basophils Absolute: 0 10*3/uL (ref 0.0–0.2)
Basos: 1 %
EOS (ABSOLUTE): 0.1 10*3/uL (ref 0.0–0.4)
Eos: 1 %
Hematocrit: 35.8 % (ref 34.0–46.6)
Hemoglobin: 12.5 g/dL (ref 11.1–15.9)
Immature Grans (Abs): 0 10*3/uL (ref 0.0–0.1)
Immature Granulocytes: 0 %
Lymphocytes Absolute: 2.2 10*3/uL (ref 0.7–3.1)
Lymphs: 34 %
MCH: 30.1 pg (ref 26.6–33.0)
MCHC: 34.9 g/dL (ref 31.5–35.7)
MCV: 86 fL (ref 79–97)
Monocytes Absolute: 0.5 10*3/uL (ref 0.1–0.9)
Monocytes: 8 %
Neutrophils Absolute: 3.7 10*3/uL (ref 1.4–7.0)
Neutrophils: 56 %
Platelets: 311 10*3/uL (ref 150–450)
RBC: 4.15 x10E6/uL (ref 3.77–5.28)
RDW: 13.4 % (ref 11.7–15.4)
WBC: 6.5 10*3/uL (ref 3.4–10.8)

## 2021-08-06 LAB — ANTI-CCP AB, IGG + IGA (RDL): Anti-CCP Ab, IgG + IgA (RDL): <20 Units (ref <20)

## 2021-08-06 LAB — SEDIMENTATION RATE: Sed Rate: 48 mm/hr — ABNORMAL HIGH (ref 0–40)

## 2021-08-07 ENCOUNTER — Encounter (HOSPITAL_COMMUNITY): Payer: Self-pay | Admitting: Occupational Therapy

## 2021-08-07 ENCOUNTER — Ambulatory Visit (HOSPITAL_COMMUNITY): Payer: 59 | Admitting: Occupational Therapy

## 2021-08-07 DIAGNOSIS — M25541 Pain in joints of right hand: Secondary | ICD-10-CM

## 2021-08-07 DIAGNOSIS — R600 Localized edema: Secondary | ICD-10-CM

## 2021-08-07 DIAGNOSIS — R29898 Other symptoms and signs involving the musculoskeletal system: Secondary | ICD-10-CM

## 2021-08-07 NOTE — Therapy (Signed)
OUTPATIENT OCCUPATIONAL THERAPY TREATMENT NOTE   Patient Name: Jackie Ayala MRN: [id number] DOB:07-01-1960, 61 y.o., female Today's Date: 08/07/2021  PCP: Sharion Balloon, FNP REFERRING PROVIDER: Sherilyn Cooter, MD  END OF SESSION:   OT End of Session - 08/04/21 1620     Visit Number 6    Number of Visits 16    Date for OT Re-Evaluation 09/01/2021    Authorization Type Generic [employer]    Authorization Time Period no auth, co copay, no visit limit    OT Start Time 1302   OT Stop Time 1343   OT Time Calculation (min) 41 min   Activity Tolerance Patient tolerated treatment well    Behavior During Therapy [facility] for tasks assessed/performed                Past Medical History:  Diagnosis Date   Hypertension    Past Surgical History:  Procedure Laterality Date   ABDOMINAL HYSTERECTOMY  2004   ovaries remain   CHOLECYSTECTOMY     Patient Active Problem List   Diagnosis Date Noted   Right hand weakness 06/19/2021   Hyperlipidemia 03/05/2021   Prediabetes 05/13/2020   Obesity (BMI 35.0-39.9 without comorbidity) 08/27/2016   Essential hypertension 06/12/2014   GERD (gastroesophageal reflux disease) 06/12/2014    ONSET DATE: Feb. 2023  REFERRING DIAG: Right hand weakness. Possible Tenosynovitis of the wrist.   THERAPY DIAG:  Pain in joint of right hand  Other symptoms and signs involving the musculoskeletal system  Localized edema   PERTINENT HISTORY: From recent MD appointment: 61 year old right-hand-dominant female who presents with weakness and mild pain in the right hand.  Is been going on for some months now.  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  She describes overall hand weakness that makes some of her daily tasks difficult.  She is able to make a full fist but is quite weak compared to the contralateral side.  She also notes some atraumatic swelling  of the middle finger.  The swelling seems to be intermittent.  She has tried a prednisone taper which provided some relief but not complete.  She is also taking diclofenac twice a day currently.     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 In February 2023, she was required to provide more assistance due to an UTI. Husband has a hospital bed with short length bed rails (hand crank), hoyer lift, dressed at bed level, BSC, wheelchair, tub bench (only does showers if she has help; otherwise it's a sponge bath in bed). Sounds like he's still requiring more of max assist overall for ADL tasks.    PRECAUTIONS: Other: follow protocol located in media tab   SUBJECTIVE: S: I'm feeling good on steroids now.  PAIN:  Are you having pain? Yes: NPRS scale: 3/10 Pain location: right hand wrist up to middle finger Pain description: throbbing, sore Aggravating factors: any pulling and gripping Relieving factors: steroids taken for swelling    Progress Note Reporting Period 07/08/21 to 08/04/21  OBJECTIVE:  UE ROM      Active ROM Right 07/08/2021 Right  08/04/2021  Elbow flexion WNL WNL  Elbow extension WNL WNL  Wrist flexion 60 52  Wrist extension 40 42  Wrist ulnar deviation 42 34  Wrist radial deviation 18 32  Wrist pronation 90 90  Wrist supination 90 90  (Blank rows = not  tested)         UE MMT:      MMT Right 07/08/2021 Right 08/04/21   Wrist flexion     Wrist extension     Wrist ulnar deviation     Wrist radial deviation     Wrist pronation     Wrist supination     (Blank rows = not tested)   HAND FUNCTION: Grip strength: Right: 20 lbs; Left: 60 lbs, Lateral pinch: Right: 10 lbs, Left: 14 lbs, and 3 point pinch: Right: 8 lbs, Left: 10 lbs Grip strength 08/04/21: Right: 20 lbs; Left: 38 lbs, Lateral pinch: Right: 11 lbs, Left: 14 lbs, and 3 point pinch: Right: 9 lbs, Left:  11 lbs      SENSATION: Reports that hot water makes her hand tingle and be numb.     EDEMA:  20.4cm right MCP joint composite 19.5 cm left MCP joint composite 6.9 cm Base of right middle finger 6.0 cm Base of left middle finger 6.5 cm Base of right index finger 6.0 cm Base of left index finger  08/04/21 20.2 cm right MCP joint composite 19.7 cm left MCP joint composite 7.0 cm Base of right middle finger 6.0 cm Base of left middle finger 6.5 cm Base of right index finger 6.5 cm Base of left index finger     GOALS:     SHORT TERM GOALS: Target date: 08/05/2021   Patient will be educated and verbalize understanding of HEP in order to facilitate her progress in therapy and begin to increase the functional use of her right hand during daily tasks.  Baseline: Pt still limited in functional use of R hand  Goal status: on-going   2.  Patient will be educated and verbalize activity modifications that she is implementing during daily tasks and when providing care for her husband in order to help decrease the overuse of the tendons in her right wrist.  Baseline: 5/15 Using elbow more for tasks to mobilize husband.  Goal status: on-going   3.  Patient will report a decrease in pain level in her right wrist/forearm approximately 3/10 or less while completing daily tasks at home.  Baseline: 5/15 pt reporting 3 or 4 pain today. Average pain is about 5 or 6.  Goal status: on-going   4.  Patient will demonstrate an improved grip and pinch strength in her right hand to approximately 75% of average norms for age.  Baseline: Pt grip and pinch has either decreased or improved minimally.  Goal status: on-going     PATIENT EDUCATION: Education details: Educated on plan to continue therapy 2x a week for 4 weeks Person educated: patient Education method:verbal  Education comprehension: verbalized understanding     HOME EXERCISE PROGRAM: 5/2: gentle massage 5/12: [name]      TODAY'S TREATMENT:  08/07/21 - Manual therapy: completed prior to exercises. Soft  tissue mobilization and myofascial release completed to radial aspect of right forearm, wrist, and thumb. Also completed to palm of hand and lumbrical muscles on either side of the of middle finger MCP joint. - Moist heat: applied to right hand 5' - Stretching: abductor pollicis longus (APL) and extensor pollicis brevis (EPB). Stretch progression followed for 5x30"  -Stretching: passive thumb adduction with gentle wrist extension for 5x30"     08/04/21  - Moist heat: applied to right hand 5' -P/ROM: Ulnar deviation 1x 30 seconds ; Ulnar deviation with thumb adduction and wrist extension 1x 30 seconds.   08/01/21 -  Moist heat: applied to right hand 5' - Stretching: abductor pollicis longus (APL) and extensor pollicis brevis (EPB). Stretch progression followed for 5x30"       CLINICAL IMPRESSION: A: Pt continues to report pain in wrist due to required usage and repetitive motions when caring for husband. Reports she wears her splint and this is what allows her to continue her tasks with less pain, her daughter ordered a prefabricated splint for her left hand. Resumed myofascial release today, max restrictions in upper forearm. Continued with stretching added passive thumb adduction with gentle wrist extension.     PLAN: OT FREQUENCY: 2x/week   OT DURATION: 4 weeks   PLANNED INTERVENTIONS: self care/ADL training, therapeutic exercise, therapeutic activity, neuromuscular re-education, manual therapy, passive range of motion, splinting, electrical stimulation, ultrasound, iontophoresis, moist heat, cryotherapy, patient/family education, coping strategies training, and DME and/or AE instructions       CONSULTED AND AGREED WITH PLAN OF CARE: Patient   PLAN FOR NEXT SESSION:  P: Continue moist heat and following protocol for stretching.    Guadelupe Sabin, OTR/L  [phone] 08/07/2021, 3:20 PM

## 2021-08-13 ENCOUNTER — Ambulatory Visit (HOSPITAL_COMMUNITY): Payer: 59 | Admitting: Occupational Therapy

## 2021-08-13 ENCOUNTER — Encounter (HOSPITAL_COMMUNITY): Payer: Self-pay | Admitting: Occupational Therapy

## 2021-08-13 DIAGNOSIS — M25541 Pain in joints of right hand: Secondary | ICD-10-CM | POA: Diagnosis not present

## 2021-08-13 DIAGNOSIS — R600 Localized edema: Secondary | ICD-10-CM

## 2021-08-13 DIAGNOSIS — R29898 Other symptoms and signs involving the musculoskeletal system: Secondary | ICD-10-CM

## 2021-08-13 NOTE — Therapy (Signed)
OUTPATIENT OCCUPATIONAL THERAPY TREATMENT NOTE   Patient Name: Jackie Ayala MRN: [id number] DOB:[birth year], 61 y.o., female Today's Date: 08/13/2021  PCP: Sharion Balloon, FNP REFERRING PROVIDER: Sherilyn Cooter, MD  END OF SESSION:   OT End of Session - 08/04/21 1620     Visit Number 7    Number of Visits 16    Date for OT Re-Evaluation 09/01/2021    Authorization Type Generic [employer]    Authorization Time Period no auth, co copay, no visit limit    OT Start Time 0902   OT Stop Time  0943   OT Time Calculation (min) 41 min   Activity Tolerance Patient tolerated treatment well    Behavior During Therapy [facility] for tasks assessed/performed                Past Medical History:  Diagnosis Date   Hypertension    Past Surgical History:  Procedure Laterality Date   ABDOMINAL HYSTERECTOMY  2004   ovaries remain   CHOLECYSTECTOMY     Patient Active Problem List   Diagnosis Date Noted   Right hand weakness 06/19/2021   Hyperlipidemia 03/05/2021   Prediabetes 05/13/2020   Obesity (BMI 35.0-39.9 without comorbidity) 08/27/2016   Essential hypertension 06/12/2014   GERD (gastroesophageal reflux disease) 06/12/2014    ONSET DATE: Feb. 2023  REFERRING DIAG: Right hand weakness. Possible Tenosynovitis of the wrist.   THERAPY DIAG:  Pain in joint of right hand  Other symptoms and signs involving the musculoskeletal system  Localized edema   PERTINENT HISTORY: From recent MD appointment: 61 year old right-hand-dominant female who presents with weakness and mild pain in the right hand.  Is been going on for some months now.  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  She describes overall hand weakness that makes some of her daily tasks difficult.  She is able to make a full fist but is quite weak compared to the contralateral side.  She also notes some atraumatic swelling  of the middle finger.  The swelling seems to be intermittent.  She has tried a prednisone taper which provided some relief but not complete.  She is also taking diclofenac twice a day currently.     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 In February 2023, she was required to provide more assistance due to an UTI. Husband has a hospital bed with short length bed rails (hand crank), hoyer lift, dressed at bed level, BSC, wheelchair, tub bench (only does showers if she has help; otherwise it's a sponge bath in bed). Sounds like he's still requiring more of max assist overall for ADL tasks.    PRECAUTIONS: Other: follow protocol located in media tab   SUBJECTIVE: S: My hand/wrist is better than my shoulder.  PAIN:  Are you having pain? Yes: NPRS scale: 3/10 Pain location: right hand wrist up to middle finger Pain description: sore Aggravating factors: any pulling and gripping Relieving factors: steroids taken for swelling    Progress Note Reporting Period 07/08/21 to 08/04/21  OBJECTIVE:  UE ROM      Active ROM Right 07/08/2021 Right  08/04/2021  Elbow flexion WNL WNL  Elbow extension WNL WNL  Wrist flexion 60 52  Wrist extension 40 42  Wrist ulnar deviation 42 34  Wrist radial deviation 18 32  Wrist pronation 90 90  Wrist supination 90 90  (Blank rows =  not tested)         UE MMT:      MMT Right 07/08/2021 Right 08/04/21   Wrist flexion     Wrist extension     Wrist ulnar deviation     Wrist radial deviation     Wrist pronation     Wrist supination     (Blank rows = not tested)   HAND FUNCTION: Grip strength: Right: 20 lbs; Left: 60 lbs, Lateral pinch: Right: 10 lbs, Left: 14 lbs, and 3 point pinch: Right: 8 lbs, Left: 10 lbs Grip strength 08/04/21: Right: 20 lbs; Left: 38 lbs, Lateral pinch: Right: 11 lbs, Left: 14 lbs, and 3 point pinch: Right: 9 lbs, Left:  11 lbs      SENSATION: Reports that hot water makes her hand tingle and be numb.     EDEMA:  20.4cm right MCP joint composite 19.5 cm left MCP joint composite 6.9 cm Base of right middle finger 6.0 cm Base of left middle finger 6.5 cm Base of right index finger 6.0 cm Base of left index finger  08/04/21 20.2 cm right MCP joint composite 19.7 cm left MCP joint composite 7.0 cm Base of right middle finger 6.0 cm Base of left middle finger 6.5 cm Base of right index finger 6.5 cm Base of left index finger     GOALS:     SHORT TERM GOALS: Target date: 08/05/2021   Patient will be educated and verbalize understanding of HEP in order to facilitate her progress in therapy and begin to increase the functional use of her right hand during daily tasks.  Baseline: Pt still limited in functional use of R hand  Goal status: on-going   2.  Patient will be educated and verbalize activity modifications that she is implementing during daily tasks and when providing care for her husband in order to help decrease the overuse of the tendons in her right wrist.  Baseline: 5/15 Using elbow more for tasks to mobilize husband.  Goal status: on-going   3.  Patient will report a decrease in pain level in her right wrist/forearm approximately 3/10 or less while completing daily tasks at home.  Baseline: 5/15 pt reporting 3 or 4 pain today. Average pain is about 5 or 6.  Goal status: on-going   4.  Patient will demonstrate an improved grip and pinch strength in her right hand to approximately 75% of average norms for age.  Baseline: Pt grip and pinch has either decreased or improved minimally.  Goal status: on-going     PATIENT EDUCATION: Education details: brace wear Person educated: patient Education method:verbal  Education comprehension: verbalized understanding     HOME EXERCISE PROGRAM: 5/2: gentle massage 5/12: [name]      TODAY'S TREATMENT:  08/13/21  - Manual therapy: completed prior to exercises. Soft tissue mobilization and myofascial release  completed to radial aspect of right forearm, wrist, and thumb. Also completed to palm of hand and lumbrical muscles on either side of the of middle finger MCP joint. - Stretching: abductor pollicis longus (APL) and extensor pollicis brevis (EPB). Stretch progression followed for 5x30"  -Stretching: passive thumb adduction with gentle wrist extension for 5x30" -A/ROM: wrist extension, flexion, ulnar and radial deviation, 10X each 5" holds -Paraffin: 8'   08/07/21 - Manual therapy: completed prior to exercises. Soft tissue mobilization and myofascial release completed to radial aspect of right forearm, wrist, and thumb. Also completed to palm of hand and lumbrical muscles on either side  of the of middle finger MCP joint. - Moist heat: applied to right hand 5' - Stretching: abductor pollicis longus (APL) and extensor pollicis brevis (EPB). Stretch progression followed for 5x30"  -Stretching: passive thumb adduction with gentle wrist extension for 5x30"     08/04/21  - Moist heat: applied to right hand 5' -P/ROM: Ulnar deviation 1x 30 seconds ; Ulnar deviation with thumb adduction and wrist extension 1x 30 seconds.        CLINICAL IMPRESSION: A: Pt reports her shoulder is bothering her more than her hand and wrist. She reports she is using her hand more at home as she feels she has more strength, however after completing tasks the pain returns. Continued with myofascial release and stretches today. Added gentle wrist A/ROM. Pt trialing paraffin at end of session for pain, reports her hand/wrist feel good after. Verbal cuing for form and technique, discussed slowly adding in tasks at home and allowing rest breaks as well.     PLAN: OT FREQUENCY: 2x/week   OT DURATION: 4 weeks   PLANNED INTERVENTIONS: self care/ADL training, therapeutic exercise, therapeutic activity, neuromuscular re-education, manual therapy, passive range of motion, splinting, electrical stimulation, ultrasound,  iontophoresis, moist heat, cryotherapy, patient/family education, coping strategies training, and DME and/or AE instructions       CONSULTED AND AGREED WITH PLAN OF CARE: Patient   PLAN FOR NEXT SESSION:  P: Continue moist heat and following protocol for stretching. Paraffin at end of session.    Guadelupe Sabin, OTR/L  [phone] 08/13/2021, 9:00 AM

## 2021-08-21 ENCOUNTER — Ambulatory Visit (HOSPITAL_COMMUNITY): Payer: 59 | Admitting: Occupational Therapy

## 2021-08-25 ENCOUNTER — Encounter (HOSPITAL_COMMUNITY): Payer: 59 | Admitting: Occupational Therapy

## 2021-08-28 ENCOUNTER — Encounter (HOSPITAL_COMMUNITY): Payer: Self-pay | Admitting: Occupational Therapy

## 2021-08-28 ENCOUNTER — Ambulatory Visit (HOSPITAL_COMMUNITY): Payer: 59 | Attending: Orthopedic Surgery | Admitting: Occupational Therapy

## 2021-08-28 DIAGNOSIS — M25541 Pain in joints of right hand: Secondary | ICD-10-CM | POA: Diagnosis present

## 2021-08-28 DIAGNOSIS — R29898 Other symptoms and signs involving the musculoskeletal system: Secondary | ICD-10-CM | POA: Diagnosis present

## 2021-08-28 NOTE — Patient Instructions (Signed)
Complete each exercise 10-15X, 2-3X/day  1) Digit composite flexion/adduction (make a fist) Hold your hand up as shown. Open and close your hand into a fist and repeat. If you cannot make a full fist, then make a partial fist.    2) Thumb/finger opposition Touch the tip of the thumb to each fingertip one by one. Extend fingers fully after they are touched.      3) Finger Taps Start with the hand flat and fingers slightly spread.  One at a time, starting with the thumb, lift each finger up separately.     4) Digit Abduction/Adduction Hold hand palm down flat on table. Spread your fingers apart and back together.      AROM Exercises   1) Wrist Flexion  Start with wrist at edge of table, palm facing up. With wrist hanging slightly off table, curl wrist upward, and back down.      2) Wrist Extension  Start with wrist at edge of table, palm facing down. With wrist slightly off the edge of the table, curl wrist up and back down.      3) Radial Deviations  Start with forearm flat against a table, wrist hanging slightly off the edge, and palm facing the wall. Bending at the wrist only, and keeping palm facing the wall, bend wrist so fist is pointing towards the floor, back up to start position, and up towards the ceiling. Return to start.        4) WRIST PRONATION  Turn your forearm towards palm face down.  Keep your elbow bent and by the side of your  Body.      5) WRIST SUPINATION  Turn your forearm towards palm face up.  Keep your elbow bent and by the side of your  Body.

## 2021-08-28 NOTE — Therapy (Signed)
OUTPATIENT OCCUPATIONAL THERAPY REASSESSMENT, TREATMENT NOTE, DISCHARGE SUMMARY   Patient Name: Jackie Ayala MRN: [id number] DOB:09/03/1960, 61 y.o., female Today's Date: 08/28/2021  PCP: Hawks, Christy A, FNP REFERRING PROVIDER: Benfield, Charlie, MD  END OF SESSION:   OT End of Session    Visit Number 8    Number of Visits 16    Date for OT Re-Evaluation 09/01/2021    Authorization Type Generic [employer]    Authorization Time Period no auth, co copay, no visit limit    OT Start Time 0905   OT Stop Time  0940   OT Time Calculation (min) 35 min   Activity Tolerance Patient tolerated treatment well    Behavior During Therapy WFL for tasks assessed/performed                Past Medical History:  Diagnosis Date   Hypertension    Past Surgical History:  Procedure Laterality Date   ABDOMINAL HYSTERECTOMY  2004   ovaries remain   CHOLECYSTECTOMY     Patient Active Problem List   Diagnosis Date Noted   Right hand weakness 06/19/2021   Hyperlipidemia 03/05/2021   Prediabetes 05/13/2020   Obesity (BMI 35.0-39.9 without comorbidity) 08/27/2016   Essential hypertension 06/12/2014   GERD (gastroesophageal reflux disease) 06/12/2014    ONSET DATE: Feb. 2023  REFERRING DIAG: Right hand weakness. Possible Tenosynovitis of the wrist.   THERAPY DIAG:  Pain in joint of right hand  Other symptoms and signs involving the musculoskeletal system   PERTINENT HISTORY: From recent MD appointment: 61-year-old right-hand-dominant female who presents with weakness and mild pain in the right hand.  Is been going on for some months now.  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  She describes overall hand weakness that makes some of her daily tasks difficult.  She is able to make a full fist but is quite weak compared to the contralateral side.  She also notes some atraumatic swelling of  the middle finger.  The swelling seems to be intermittent.  She has tried a prednisone taper which provided some relief but not complete.  She is also taking diclofenac twice a day currently.     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 In February 2023, she was required to provide more assistance due to an UTI. Husband has a hospital bed with short length bed rails (hand crank), hoyer lift, dressed at bed level, BSC, wheelchair, tub bench (only does showers if she has help; otherwise it's a sponge bath in bed). Sounds like he's still requiring more of max assist overall for ADL tasks.    PRECAUTIONS: Other: follow protocol located in media tab   SUBJECTIVE: S: It's doing ok, I've been using it carefully. PAIN:  Are you having pain? Yes: NPRS scale: 4/10 Pain location: right hand wrist up to middle finger Pain description: sore Aggravating factors: pulling and gripping Relieving factors: pain relief gel     OBJECTIVE:  UE ROM      Active ROM Right 07/08/2021 Right  08/04/2021 Right 08/28/21  Elbow flexion WNL WNL WNL  Elbow extension WNL WNL WNL  Wrist flexion 60 52 54  Wrist extension 40 42 55  Wrist ulnar deviation 42 34 34  Wrist radial deviation 18 32 32  Wrist pronation 90 90 90  Wrist supination 90 90 90  (Blank rows = not tested)           UE MMT:      MMT Right 07/08/2021 Right 08/04/21  Right 08/28/21  Wrist flexion    4+/5  Wrist extension    4+/5  Wrist ulnar deviation    5/5  Wrist radial deviation    5/5  Wrist pronation    5/5  Wrist supination    5/5  (Blank rows = not tested)   HAND FUNCTION: Grip strength: Right: 20 lbs; Left: 60 lbs, Lateral pinch: Right: 10 lbs, Left: 14 lbs, and 3 point pinch: Right: 8 lbs, Left: 10 lbs previous Grip strength 08/04/21: Right: 20 lbs; Left: 38 lbs, Lateral pinch: Right: 11 lbs, Left: 14 lbs, and 3 point pinch: Right: 9 lbs, Left:  11 lbs Grip strength 08/28/21: Right: 60 lbs; Left: 80 lbs , Lateral  pinch: Right: 15 lbs  Left: 16 lbs, and 3 point pinch: Right: 13 lbs, Left: 14 lbs       SENSATION: Reports that hot water makes her hand tingle and be numb.    EDEMA:  20.4cm right MCP joint composite 19.5 cm left MCP joint composite 6.9 cm Base of right middle finger 6.0 cm Base of left middle finger 6.5 cm Base of right index finger 6.0 cm Base of left index finger  08/04/21 20.2 cm right MCP joint composite 19.7 cm left MCP joint composite 7.0 cm Base of right middle finger 6.0 cm Base of left middle finger 6.5 cm Base of right index finger 6.5 cm Base of left index finger  08/28/21 20.5 cm right MCP joint composite 19.7 cm left MCP joint composite 7.0 cm Base of right middle finger 6.0 cm Base of left middle finger 6.5 cm Base of right index finger 6.5 cm Base of left index finger     GOALS:     SHORT TERM GOALS: Target date: 08/05/2021   Patient will be educated and verbalize understanding of HEP in order to facilitate her progress in therapy and begin to increase the functional use of her right hand during daily tasks.  Baseline: Pt still limited in functional use of R hand  Goal status: MET   2.  Patient will be educated and verbalize activity modifications that she is implementing during daily tasks and when providing care for her husband in order to help decrease the overuse of the tendons in her right wrist.  Baseline: 5/15 Using elbow more for tasks to mobilize husband.  Goal status: MET   3.  Patient will report a decrease in pain level in her right wrist/forearm approximately 3/10 or less while completing daily tasks at home.  Baseline: 5/15 and 6/8 pt reporting 3 or 4 pain today. Average pain is about 5 or 6.  Goal status: NOT MET   4.  Patient will demonstrate an improved grip and pinch strength in her right hand to approximately 75% of average norms for age.  Baseline: Pt grip and pinch has either decreased or improved minimally.  Goal status:  MET     PATIENT EDUCATION: Education details: hand and wrist HEP Person educated: patient Education method:verbal, demonstration Education comprehension: verbalized understanding, returned demonstration     HOME EXERCISE PROGRAM: 5/2: gentle massage 5/12: Finkelstein Stretch; 6/8: wrist A/ROM, hand A/ROM     TODAY'S TREATMENT:  08/28/21  -Discussed new HEP and modifications to continue at home. Pt to continue wearing brace for lifting/pulling/pushing tasks, when  wrist feels inflamed stop the activity if at all possible and rest the wrist. Ice and edema glove as needed.   -  Provided edema glove and fitted to pt, discussed wearing at home during sewing tasks and when she has more edema    08/13/21  - Manual therapy: completed prior to exercises. Soft tissue mobilization and myofascial release completed to radial aspect of right forearm, wrist, and thumb. Also completed to palm of hand and lumbrical muscles on either side of the of middle finger MCP joint. - Stretching: abductor pollicis longus (APL) and extensor pollicis brevis (EPB). Stretch progression followed for 5x30"  -Stretching: passive thumb adduction with gentle wrist extension for 5x30" -A/ROM: wrist extension, flexion, ulnar and radial deviation, 10X each 5" holds -Paraffin: 8'   08/07/21 - Manual therapy: completed prior to exercises. Soft tissue mobilization and myofascial release completed to radial aspect of right forearm, wrist, and thumb. Also completed to palm of hand and lumbrical muscles on either side of the of middle finger MCP joint. - Moist heat: applied to right hand 5' - Stretching: abductor pollicis longus (APL) and extensor pollicis brevis (EPB). Stretch progression followed for 5x30"  -Stretching: passive thumb adduction with gentle wrist extension for 5x30"     CLINICAL IMPRESSION: A: Reassessment completed this session, pt has improved wrist ROM, was able to test wrist strength today, grip and pinch  have significantly improved. Pt has met 3/4 goals, continues to have pain and soreness due to repetitive motions during the day required for ADLs and caregiving tasks. Discussed plan to transition to HEP, pt is agreeable. Provide HEP for hand and wrist A/ROM, pt performing with good form. Also reviewed edema glove use and edema management techniques, joint protection at home.     PLAN: OT FREQUENCY: 2x/week   OT DURATION: 4 weeks   PLANNED INTERVENTIONS: self care/ADL training, therapeutic exercise, therapeutic activity, neuromuscular re-education, manual therapy, passive range of motion, splinting, electrical stimulation, ultrasound, iontophoresis, moist heat, cryotherapy, patient/family education, coping strategies training, and DME and/or AE instructions       CONSULTED AND AGREED WITH PLAN OF CARE: Patient   PLAN FOR NEXT SESSION:  P: discharge pt    Leslie Troxler, OTR/L  [phone] 08/28/2021, 9:04 AM    OCCUPATIONAL THERAPY DISCHARGE SUMMARY  Visits from Start of Care: 8  Current functional level related to goals / functional outcomes: See above. Pt has met 3/4 goals, notes improvement in ADL completion, is able to sew for short times now.    Remaining deficits: Pain and soreness with repetitive use   Education / Equipment: HEP   Patient agrees to discharge. Patient goals were met. Patient is being discharged due to meeting the stated rehab goals..      

## 2021-09-01 ENCOUNTER — Encounter: Payer: Self-pay | Admitting: Nurse Practitioner

## 2021-09-01 ENCOUNTER — Ambulatory Visit (INDEPENDENT_AMBULATORY_CARE_PROVIDER_SITE_OTHER): Payer: 59 | Admitting: Nurse Practitioner

## 2021-09-01 VITALS — BP 139/74 | HR 62 | Temp 100.1°F | Ht 62.0 in | Wt 201.2 lb

## 2021-09-01 DIAGNOSIS — R21 Rash and other nonspecific skin eruption: Secondary | ICD-10-CM

## 2021-09-01 DIAGNOSIS — D170 Benign lipomatous neoplasm of skin and subcutaneous tissue of head, face and neck: Secondary | ICD-10-CM | POA: Diagnosis not present

## 2021-09-01 MED ORDER — HYDROCORTISONE 1 % EX LOTN: 1.0000 "application " | TOPICAL_LOTION | Freq: Two times a day (BID) | CUTANEOUS | 0 refills | Status: DC

## 2021-09-01 NOTE — Patient Instructions (Signed)
Lipoma  A lipoma is a noncancerous (benign) tumor that is made up of fat cells. This is a very common type of soft-tissue growth. Lipomas are usually found under the skin (subcutaneous). They may occur in any tissue of the body that contains fat. Common areas for lipomas to appear include the back, arms, shoulders, buttocks, and thighs. Lipomas grow slowly, and they are usually painless. Most lipomas do not cause problems and do not require treatment. What are the causes? The cause of this condition is not known. What increases the risk? You are more likely to develop this condition if: You are 39-16 years old. You have a family history of lipomas. What are the signs or symptoms? A lipoma usually appears as a small, round bump under the skin. In most cases, the lump will: Feel soft or rubbery. Not cause pain or other symptoms. However, if a lipoma is located in an area where it pushes on nerves, it can become painful or cause other symptoms. How is this diagnosed? A lipoma can usually be diagnosed with a physical exam. You may also have tests to confirm the diagnosis and to rule out other conditions. Tests may include: Imaging tests, such as a CT scan or an MRI. Removal of a tissue sample to be looked at under a microscope (biopsy). How is this treated? Treatment for this condition depends on the size of the lipoma and whether it is causing any symptoms. For small lipomas that are not causing problems, no treatment is needed. If a lipoma is bigger or it causes problems, surgery may be done to remove the lipoma. Lipomas can also be removed to improve appearance. Most often, the procedure is done after applying a medicine that numbs the area (local anesthetic). Liposuction may be done to reduce the size of the lipoma before it is removed through surgery, or it may be done to remove the lipoma. Lipomas are removed with this method to limit incision size and scarring. A liposuction tube is  inserted through a small incision into the lipoma, and the contents of the lipoma are removed through the tube with suction. Follow these instructions at home: Watch your lipoma for any changes. Keep all follow-up visits. This is important. Where to find more information OrthoInfo: orthoinfo.aaos.org Contact a health care provider if: Your lipoma becomes larger or hard. Your lipoma becomes painful, red, or increasingly swollen. These could be signs of infection or a more serious condition. Get help right away if: You develop tingling or numbness in an area near the lipoma. This could indicate that the lipoma is causing nerve damage. Summary A lipoma is a noncancerous tumor that is made up of fat cells. Most lipomas do not cause problems and do not require treatment. If a lipoma is bigger or it causes problems, surgery may be done to remove the lipoma. Contact a health care provider if your lipoma becomes larger or hard, or if it becomes painful, red, or increasingly swollen. These could be signs of infection or a more serious condition. This information is not intended to replace advice given to you by your health care provider. Make sure you discuss any questions you have with your health care provider. Document Revised: 03/28/2021 Document Reviewed: 03/28/2021 Elsevier Patient Education  [place], Adult An insect bite can make your skin red, itchy, and swollen. An insect bite is different from an insect sting, which happens when an insect injects poison (venom) into the skin. Some insects can  spread disease to people through a bite. However, most insect bites do not lead to disease and are not serious. What are the causes? Insects may bite for a variety of reasons, including: Hunger. To defend themselves. Insects that bite include: Spiders. Mosquitoes. Ticks. Fleas. Ants. Flies. Kissing bugs. Chiggers. What are the signs or symptoms? Symptoms of this  condition include: Itching or pain in the bite area. Redness and swelling in the bite area. An open wound (skin ulcer). In many cases, symptoms last for 2-4 days. In rare cases, a person may have a severe allergic reaction (anaphylactic reaction) to a bite. Symptoms of an anaphylactic reaction may include: Feeling warm in the face (flushed). This may include redness. Itchy, red, swollen areas of skin (hives). Swelling of the eyes, lips, face, mouth, tongue, or throat. Difficulty breathing, speaking, or swallowing. Noisy breathing (wheezing). Dizziness or light-headedness. Fainting. Pain or cramping in the abdomen. Vomiting. Diarrhea. How is this diagnosed? This condition is usually diagnosed based on symptoms and a physical exam. How is this treated? Treatment is usually not needed. Symptoms often go away on their own. When treatment is recommended, it may involve: Applying a cream or lotion to the bite area. This treatment helps with itching. Taking an antibiotic medicine. This treatment is needed if the bite area gets infected. Getting a tetanus shot, if you are not up to date on this vaccine. Applying ice to the affected area. Allergy medicines called antihistamines. This treatment may be needed if you develop itching or an allergic reaction to the insect bite. Giving yourself an epinephrine injection if you have an anaphylactic reaction to a bite. To give the injection, you will use what is commonly called an auto-injector "pen" (pre-filled automatic epinephrine injection device). Your health care provider will teach you how to use an auto-injector pen. Follow these instructions at home: Bite area care  Do not scratch the bite area. Keep the bite area clean and dry. Wash it every day with soap and water as told by your health care provider. Check the bite area every day for signs of infection. Check for: Redness, swelling, or pain. Fluid or blood. Warmth. Pus or a bad  smell. Managing pain, itching, and swelling  You may apply cortisone cream, calamine lotion, or a paste made of baking soda and water to the bite area as told by your health care provider. If directed, put ice on the bite area. Put ice in a plastic bag. Place a towel between your skin and the bag. Leave the ice on for 20 minutes, 2-3 times a day. General instructions Apply or take over-the-counter and prescription medicines only as told by your health care provider. If you were prescribed an antibiotic medicine, take or apply it as told by your health care provider. Do not stop using the antibiotic even if your condition improves. Keep all follow-up visits as told by your health care provider. This is important. How is this prevented? To help reduce your risk of insect bites: When you are outdoors, wear clothing that covers your arms and legs. This is especially important in the early morning and evening. Use insect repellent. The best insect repellents contain DEET, picaridin, oil of lemon eucalyptus (OLE), or IR3535. Consider spraying your clothing with a pesticide called permethrin. Permethrin helps prevent insect bites. It works for several weeks and for up to 5-6 clothing washes. Do not apply permethrin directly to the skin. If your home windows do not have screens, consider installing them. If  you will be sleeping in an area where there are mosquitoes, consider covering your sleeping area with a mosquito net. Contact a health care provider if: You have redness, swelling, or pain in the bite area. You have fluid or blood coming from the bite area. The bite area feels warm to the touch. You have pus or a bad smell coming from the bite area. You have a fever. Get help right away if: You have joint pain. You have a rash. You feel unusually tired or sleepy. You have neck pain. You have a headache. You have unusual weakness. You develop symptoms of an anaphylactic reaction. These may  include: Flushed skin. Hives. Swelling of the eyes, lips, face, mouth, tongue, or throat. Difficulty breathing, speaking, or swallowing. Wheezing. Dizziness or light-headedness. Fainting. Pain or cramping in the abdomen. Vomiting. Diarrhea. These symptoms may represent a serious problem that is an emergency. Do not wait to see if the symptoms will go away. Do the following right away: Use the auto-injector pen as you have been instructed. Get medical help. Call your local emergency services (911 in the [place] Do not drive yourself to the hospital. Summary An insect bite can make your skin red, itchy, and swollen. Treatment is usually not needed. Symptoms often go away on their own. When treatment is recommended, it may involve taking medicine, applying medicine to the area, or applying ice. Apply or take over-the-counter and prescription medicines only as told by your health care provider. Use insect repellent to help prevent insect bites. Contact a health care provider if you have any signs of infection in the bite area. This information is not intended to replace advice given to you by your health care provider. Make sure you discuss any questions you have with your health care provider. Document Revised: 12/09/2020 Document Reviewed: 12/09/2020 Elsevier Patient Education  [place].

## 2021-09-01 NOTE — Progress Notes (Signed)
Acute Office Visit  Subjective:     Patient ID: Jackie Ayala, female    DOB: [birth year], 61 y.o.   MRN: [id number]  Chief Complaint  Patient presents with   Neck Pain    Lump on the right side of neck - getting bigger - going on for about a month    Insect Bite    2 spots on the back of neck - itching about a week     Rash This is a new problem. The current episode started yesterday. The problem has been gradually improving since onset. The affected locations include the neck. The rash is characterized by redness and itchiness. Associated with: Patient suspects insect bite. Pertinent negatives include no anorexia, congestion, cough, diarrhea or fever. Past treatments include nothing.   Patient is a 61 year old female who presents with lipoma on the right lateral side of neck.  Patient observed swelling in the last few days.  No pain, tenderness, fever or flulike symptoms associated with current symptoms.   Review of Systems  Constitutional: Negative.  Negative for chills and fever.  HENT: Negative.  Negative for congestion.   Respiratory: Negative.  Negative for cough.   Cardiovascular: Negative.   Gastrointestinal:  Negative for anorexia and diarrhea.  Genitourinary: Negative.   Musculoskeletal: Negative.   Skin:  Positive for rash.  Neurological: Negative.   All other systems reviewed and are negative.       Objective:    BP 139/74   Pulse 62   Temp 100.1 F (37.8 C)   Ht '5\' 2"'$  (1.575 m)   Wt 201 lb 3.2 oz (91.3 kg)   SpO2 95%   BMI 36.80 kg/m  BP Readings from Last 3 Encounters:  09/01/21 139/74  07/31/21 137/75  06/10/21 (!) 164/82   Wt Readings from Last 3 Encounters:  09/01/21 201 lb 3.2 oz (91.3 kg)  07/31/21 198 lb 3.2 oz (89.9 kg)  06/10/21 196 lb (88.9 kg)      Physical Exam Vitals and nursing note reviewed.  Constitutional:      Appearance: Normal appearance. She is obese.  HENT:     Head: Normocephalic.     Right Ear: External ear  normal.     Left Ear: External ear normal.     Nose: Nose normal.     Mouth/Throat:     Mouth: Mucous membranes are moist.     Pharynx: Oropharynx is clear.  Eyes:     Conjunctiva/sclera: Conjunctivae normal.  Neck:      Comments: Lipoma right lateral side of neck  Cardiovascular:     Rate and Rhythm: Normal rate and regular rhythm.     Pulses: Normal pulses.     Heart sounds: Normal heart sounds.  Pulmonary:     Effort: Pulmonary effort is normal.     Breath sounds: Normal breath sounds.  Abdominal:     General: Bowel sounds are normal.  Musculoskeletal:     Cervical back: Full passive range of motion without pain. No edema or rigidity. No pain with movement. Normal range of motion.  Skin:    General: Skin is warm.     Findings: Rash present.  Neurological:     Mental Status: She is alert and oriented to person, place, and time.  Psychiatric:        Behavior: Behavior normal.     No results found for any visits on 09/01/21.      Assessment & Plan:  Painless lipoma located right lateral  side of her neck.  Symptoms present in the past few days.  Completed ultrasound results pending.  Patient knows to follow-up with worsening unresolved symptoms.  Advised patient to apply ice or warm compress as needed for discomfort.  For rash like symptoms upon patient's skin.  On assessment rash looks like mosquito bite.  Advised patient to apply hydrocortisone cream to alleviate itching and follow-up with worsening symptoms.  Patient verbalized understanding. Problem List Items Addressed This Visit   None Visit Diagnoses     Lipoma of neck    -  Primary   Relevant Orders   US Soft Tissue Head/Neck (NON-THYROID)   Rash       Relevant Medications   hydrocortisone 1 % lotion       Meds ordered this encounter  Medications   hydrocortisone 1 % lotion    Sig: Apply 1 application  topically 2 (two) times daily.    Dispense:  118 mL    Refill:  0    Order Specific Question:    Supervising Provider    Answer:   Claretta Fraise [[id number]]    Return if symptoms worsen or fail to improve.  Ivy Lynn, NP

## 2021-09-04 ENCOUNTER — Ambulatory Visit: Payer: 59 | Admitting: Family

## 2021-09-04 ENCOUNTER — Ambulatory Visit (INDEPENDENT_AMBULATORY_CARE_PROVIDER_SITE_OTHER): Payer: 59 | Admitting: Family

## 2021-09-04 ENCOUNTER — Encounter: Payer: Self-pay | Admitting: Family

## 2021-09-04 VITALS — BP 138/75 | HR 56 | Temp 98.2°F | Wt 201.0 lb

## 2021-09-04 DIAGNOSIS — I10 Essential (primary) hypertension: Secondary | ICD-10-CM

## 2021-09-04 DIAGNOSIS — Z1211 Encounter for screening for malignant neoplasm of colon: Secondary | ICD-10-CM

## 2021-09-04 DIAGNOSIS — K219 Gastro-esophageal reflux disease without esophagitis: Secondary | ICD-10-CM

## 2021-09-04 DIAGNOSIS — E785 Hyperlipidemia, unspecified: Secondary | ICD-10-CM | POA: Diagnosis not present

## 2021-09-04 DIAGNOSIS — M159 Polyosteoarthritis, unspecified: Secondary | ICD-10-CM

## 2021-09-04 DIAGNOSIS — E669 Obesity, unspecified: Secondary | ICD-10-CM | POA: Diagnosis not present

## 2021-09-04 DIAGNOSIS — R7303 Prediabetes: Secondary | ICD-10-CM

## 2021-09-04 NOTE — Patient Instructions (Signed)

## 2021-09-04 NOTE — Progress Notes (Signed)
Subjective:    Patient ID: Jackie Ayala, female    DOB: [birth year], 61 y.o.   MRN: [id number]  Chief Complaint  Patient presents with   Medical Management of Chronic Issues   Hypertension   Pt presents to the office today for chronic follow up. She is followed by Ortho as needed and had lumbar surgery on 09/06/20. She is morbid obese with a BMI of 36 and HTN and Hyperlipemia.   She cares for her husband.  Hypertension This is a chronic problem. The current episode started more than 1 year ago. The problem has been resolved since onset. The problem is controlled. Pertinent negatives include no blurred vision, malaise/fatigue, peripheral edema or shortness of breath. Risk factors for coronary artery disease include dyslipidemia, obesity and sedentary lifestyle. The current treatment provides moderate improvement. There is no history of CVA or heart failure.  Gastroesophageal Reflux She complains of belching and heartburn. This is a chronic problem. The current episode started more than 1 year ago. The problem occurs occasionally. The problem has been waxing and waning. Risk factors include obesity. She has tried a PPI for the symptoms. The treatment provided moderate relief.  Hyperlipidemia This is a chronic problem. The current episode started more than 1 year ago. The problem is controlled. Exacerbating diseases include obesity. Pertinent negatives include no shortness of breath. Current antihyperlipidemic treatment includes statins. The current treatment provides moderate improvement of lipids. Risk factors for coronary artery disease include dyslipidemia, hypertension and a sedentary lifestyle.  Diabetes She presents for her follow-up diabetic visit. There are no hypoglycemic associated symptoms. Pertinent negatives for diabetes include no blurred vision and no foot paresthesias. There are no hypoglycemic complications. Pertinent negatives for diabetic complications include no CVA. Risk  factors for coronary artery disease include dyslipidemia, diabetes mellitus, hypertension and sedentary lifestyle. (Does not check BS at home) Eye exam is current.  Arthritis Presents for follow-up visit. She complains of pain and stiffness. Affected locations include the left shoulder, right shoulder and right wrist. Her pain is at a severity of 8/10.      Review of Systems  Constitutional:  Negative for malaise/fatigue.  Eyes:  Negative for blurred vision.  Respiratory:  Negative for shortness of breath.   Gastrointestinal:  Positive for heartburn.  Musculoskeletal:  Positive for arthritis and stiffness.  All other systems reviewed and are negative.      Objective:   Physical Exam Vitals reviewed.  Constitutional:      General: She is not in acute distress.    Appearance: She is well-developed. She is obese.  HENT:     Head: Normocephalic and atraumatic.     Right Ear: Tympanic membrane normal.     Left Ear: Tympanic membrane normal.  Eyes:     Pupils: Pupils are equal, round, and reactive to light.  Neck:     Thyroid: No thyromegaly.  Cardiovascular:     Rate and Rhythm: Normal rate and regular rhythm.     Heart sounds: Normal heart sounds. No murmur heard. Pulmonary:     Effort: Pulmonary effort is normal. No respiratory distress.     Breath sounds: Normal breath sounds. No wheezing.  Abdominal:     General: Bowel sounds are normal. There is no distension.     Palpations: Abdomen is soft.     Tenderness: There is no abdominal tenderness.  Musculoskeletal:        General: Tenderness present.     Cervical back: Normal range of motion and  neck supple.     Comments: Pain in right shoulder and right wrist with flexion  Skin:    General: Skin is warm and dry.  Neurological:     Mental Status: She is alert and oriented to person, place, and time.     Cranial Nerves: No cranial nerve deficit.     Deep Tendon Reflexes: Reflexes are normal and symmetric.  Psychiatric:         Behavior: Behavior normal.        Thought Content: Thought content normal.        Judgment: Judgment normal.       BP 138/75   Pulse (!) 56   Temp 98.2 F (36.8 C)   Wt 201 lb (91.2 kg)   SpO2 99%   BMI 36.76 kg/m      Assessment & Plan:  Jackie Ayala comes in today with chief complaint of Medical Management of Chronic Issues and Hypertension   Diagnosis and orders addressed:  1. Essential hypertension  2. Gastroesophageal reflux disease, unspecified whether esophagitis present  3. Hyperlipidemia, unspecified hyperlipidemia type  4. Obesity (BMI 35.0-39.9 without comorbidity)  5. Prediabetes  6. Primary osteoarthritis involving multiple joints  7. Colon cancer screening - Cologuard   Labs reviewed  Health Maintenance reviewed Diet and exercise encouraged  Follow up plan: 6 months    Evelina Dun, FNP

## 2021-09-08 ENCOUNTER — Encounter (HOSPITAL_COMMUNITY): Payer: 59 | Admitting: Occupational Therapy

## 2021-09-10 ENCOUNTER — Ambulatory Visit (HOSPITAL_COMMUNITY)
Admission: RE | Admit: 2021-09-10 | Discharge: 2021-09-10 | Disposition: A | Discharge status: Home / Self Care | Payer: 59 | Source: Ambulatory Visit | Attending: Nurse Practitioner | Admitting: Nurse Practitioner

## 2021-09-10 DIAGNOSIS — D170 Benign lipomatous neoplasm of skin and subcutaneous tissue of head, face and neck: Secondary | ICD-10-CM | POA: Diagnosis present

## 2021-09-12 ENCOUNTER — Encounter (HOSPITAL_COMMUNITY): Payer: 59 | Admitting: Occupational Therapy

## 2021-09-15 ENCOUNTER — Encounter (HOSPITAL_COMMUNITY): Payer: 59 | Admitting: Occupational Therapy

## 2021-09-19 ENCOUNTER — Encounter (HOSPITAL_COMMUNITY): Payer: 59 | Admitting: Occupational Therapy

## 2021-09-24 ENCOUNTER — Encounter (HOSPITAL_COMMUNITY): Payer: 59 | Admitting: Occupational Therapy

## 2021-09-26 ENCOUNTER — Encounter (HOSPITAL_COMMUNITY): Payer: 59 | Admitting: Occupational Therapy

## 2021-09-26 LAB — COLOGUARD: COLOGUARD: NEGATIVE

## 2021-09-29 ENCOUNTER — Encounter (HOSPITAL_COMMUNITY): Payer: 59 | Admitting: Occupational Therapy

## 2021-10-03 ENCOUNTER — Encounter (HOSPITAL_COMMUNITY): Payer: 59 | Admitting: Occupational Therapy

## 2021-10-07 ENCOUNTER — Encounter (HOSPITAL_COMMUNITY): Payer: 59 | Admitting: Occupational Therapy

## 2021-10-10 ENCOUNTER — Encounter (HOSPITAL_COMMUNITY): Payer: 59 | Admitting: Occupational Therapy

## 2021-10-14 ENCOUNTER — Encounter (HOSPITAL_COMMUNITY): Payer: 59 | Admitting: Occupational Therapy

## 2021-10-17 ENCOUNTER — Encounter (HOSPITAL_COMMUNITY): Payer: 59 | Admitting: Occupational Therapy

## 2021-11-05 ENCOUNTER — Ambulatory Visit (INDEPENDENT_AMBULATORY_CARE_PROVIDER_SITE_OTHER): Payer: 59

## 2021-11-05 ENCOUNTER — Ambulatory Visit (INDEPENDENT_AMBULATORY_CARE_PROVIDER_SITE_OTHER): Payer: 59 | Admitting: Family Medicine

## 2021-11-05 ENCOUNTER — Encounter: Payer: Self-pay | Admitting: Family Medicine

## 2021-11-05 VITALS — BP 124/81 | HR 66 | Temp 97.8°F | Ht 62.0 in | Wt 204.6 lb

## 2021-11-05 DIAGNOSIS — M25512 Pain in left shoulder: Secondary | ICD-10-CM

## 2021-11-05 NOTE — Progress Notes (Addendum)
Subjective:  Patient ID: Jackie Ayala, female    DOB: 1960-06-17  Age: 61 y.o. MRN: [id number]  CC: Shoulder Pain   HPI Kiah Vanalstine presents for increasing pain in the left shoulder.  There is been a mild discomfort for an extended time.  However, starting yesterday she experienced a significant increase in pain.  She describes it now as 8/10.  It is a ripping hot sensation.  She points to the right anterior shoulder just in front of the [facility] area.  She describes decreased range of motion.  Of note is that she is also had some discomfort in the right shoulder and both wrists.  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  She has to turn him in bed.  To get him out of bed she has to get him on a Hoyer lift.  It is unclear how much help she actually has with this.  She is tearful at the thought of having to place him in a facility.     11/05/2021   11:08 AM 09/04/2021   10:06 AM 09/04/2021   10:05 AM  Depression screen PHQ 2/9  Decreased Interest 0  0  Down, Depressed, Hopeless 0  0  PHQ - 2 Score 0  0  Altered sleeping  0   Tired, decreased energy  0   Change in appetite  0   Feeling bad or failure about yourself   0   Trouble concentrating  0   Moving slowly or fidgety/restless  0   Suicidal thoughts  0   Difficult doing work/chores  Not difficult at all     History Amiliana has a past medical history of Hypertension.   She has a past surgical history that includes Abdominal hysterectomy (2004) and Cholecystectomy.   Her family history includes Breast cancer (age of onset: 3) in her mother; COPD in her mother; Diabetes in her mother; Heart disease in her father; Hypertension in her mother.She reports that she has never smoked. She has never used smokeless tobacco. She reports that she does not drink alcohol and does not use drugs.    ROS Review of Systems  Constitutional:  Positive for activity change (LUE decrease)  and fever.  Musculoskeletal:  Positive for arthralgias and back pain.    Objective:  BP 124/81   Pulse 66   Temp 97.8 F (36.6 C)   Ht '5\' 2"'$  (1.575 m)   Wt 204 lb 9.6 oz (92.8 kg)   SpO2 96%   BMI 37.42 kg/m   BP Readings from Last 3 Encounters:  11/05/21 124/81  09/04/21 138/75  09/01/21 139/74    Wt Readings from Last 3 Encounters:  11/05/21 204 lb 9.6 oz (92.8 kg)  09/04/21 201 lb (91.2 kg)  09/01/21 201 lb 3.2 oz (91.3 kg)     Physical Exam Constitutional:      General: She is not in acute distress.    Appearance: She is well-developed.  Musculoskeletal:        General: Tenderness (anterior right shoulder is paoint-tender in front of the [facility] for flexion, internal rotation and especially abduction. Nontender foro shoulder shrug) present.  Skin:    General: Skin is warm and dry.  Neurological:     Mental Status: She is alert and oriented to person, place, and time.       Assessment & Plan:   Allen was seen today for shoulder pain.  Diagnoses  and all orders for this visit:  Acute pain of left shoulder -     DG Shoulder Left; Future -     Ambulatory referral to Physical Therapy       I am having Antonela Freiman maintain her Cholecalciferol (VITAMIN D-3 PO), Omega-3, Zinc, cetirizine, atenolol, lisinopril, omeprazole, rosuvastatin, diclofenac, diclofenac Sodium, and hydrocortisone.  Allergies as of 11/05/2021       Reactions   Penicillins    Sulfa Antibiotics         Medication List        Accurate as of November 05, 2021 11:54 AM. If you have any questions, ask your nurse or doctor.          atenolol 50 MG tablet Commonly known as: TENORMIN One tab by mouth every day. Needs appointment for future refills.   cetirizine 10 MG tablet Commonly known as: ZYRTEC Take 1 tablet (10 mg total) by mouth daily.   diclofenac 75 MG EC tablet Commonly known as: VOLTAREN Take 1 tablet (75 mg total) by mouth 2 (two) times daily.   diclofenac Sodium  1 % Gel Commonly known as: VOLTAREN Apply 2 g topically 4 (four) times daily.   hydrocortisone 1 % lotion Apply 1 application  topically 2 (two) times daily.   lisinopril 20 MG tablet Commonly known as: ZESTRIL Take 1 tablet (20 mg total) by mouth daily.   Omega-3 1000 MG Caps Take 1 capsule by mouth daily.   omeprazole 40 MG capsule Commonly known as: PRILOSEC Take 1 capsule (40 mg total) by mouth daily. (Needs to be seen before next refill)   rosuvastatin 5 MG tablet Commonly known as: Crestor Take 1 tablet (5 mg total) by mouth daily.   VITAMIN D-3 PO Take by mouth. 1000 IUD 1-2 capsules daily   Zinc 100 MG Tabs Take by mouth.         Follow-up: Return if symptoms worsen or fail to improve.  Claretta Fraise, M.D.

## 2021-11-05 NOTE — Addendum Note (Signed)
Addended by: Claretta Fraise on: 11/05/2021 11:54 AM   Modules accepted: Orders

## 2021-11-12 ENCOUNTER — Ambulatory Visit: Payer: 59 | Attending: Family Medicine | Admitting: Physical Therapy

## 2021-11-12 ENCOUNTER — Encounter: Payer: Self-pay | Admitting: Physical Therapy

## 2021-11-12 ENCOUNTER — Other Ambulatory Visit: Payer: Self-pay

## 2021-11-12 DIAGNOSIS — M25512 Pain in left shoulder: Secondary | ICD-10-CM | POA: Diagnosis present

## 2021-11-12 DIAGNOSIS — M6281 Muscle weakness (generalized): Secondary | ICD-10-CM | POA: Insufficient documentation

## 2021-11-12 NOTE — Therapy (Signed)
OUTPATIENT PHYSICAL THERAPY SHOULDER EVALUATION   Patient Name: Jackie Ayala MRN: [id number] DOB:Apr 29, 1960, 61 y.o., female Today's Date: 11/12/2021   PT End of Session - 11/12/21 0957     Visit Number 1    Number of Visits 12    Date for PT Re-Evaluation 12/24/21    Authorization Type FOTO AT LEAST EVERY 5TH VISIT.  PROGRESS NOTE AT 10TH VISIT.  KX MODIFIER AFTER 15 VISITS.    PT Start Time 0816    PT Stop Time 0905    PT Time Calculation (min) 49 min    Activity Tolerance Patient tolerated treatment well    Behavior During Therapy [facility] for tasks assessed/performed             Past Medical History:  Diagnosis Date   Hypertension    Past Surgical History:  Procedure Laterality Date   ABDOMINAL HYSTERECTOMY  2004   ovaries remain   CHOLECYSTECTOMY     Patient Active Problem List   Diagnosis Date Noted   Primary osteoarthritis involving multiple joints 09/04/2021   Right hand weakness 06/19/2021   Hyperlipidemia 03/05/2021   Prediabetes 05/13/2020   Obesity (BMI 35.0-39.9 without comorbidity) 08/27/2016   Essential hypertension 06/12/2014   GERD (gastroesophageal reflux disease) 06/12/2014   REFERRING PROVIDER: Claretta Fraise MD  REFERRING DIAG: Acute pain of left shoulder.  THERAPY DIAG:  Acute pain of left shoulder  Rationale for Evaluation and Treatment Rehabilitation  ONSET DATE: One week+  SUBJECTIVE:                                                                                                                                                                                      SUBJECTIVE STATEMENT: The patient presents to the clinic today with c/o left shoulder pain over the last couple of weeks.  She relates an incident carrying objects out of her attic and also providing care for her husband.  Her pain is a 4/10 today but can rise to higher levels with over-exertion.  Rest her shoulder can decrease her pain.  An X-ray reveals:  Degenerative changes of  the acromioclavicular and glenohumeral joints are seen. No acute fracture or dislocation is noted. No soft tissue abnormality is seen.    PERTINENT HISTORY: H/o right hand pain (recent OT visit), lumbar surgery, HTN.  PAIN:  Are you having pain? Yes: NPRS scale: 4/10 Pain location: Left anterior shoulder. Pain description: Ache, sore. Aggravating factors: As above. Relieving factors: As above.  PRECAUTIONS: None  WEIGHT BEARING RESTRICTIONS No  FALLS:  Has patient fallen in last 6 months? No  LIVING ENVIRONMENT: Lives with: lives with their spouse Lives in: House/apartment Has following  equipment at home: None  PLOF: Independent  PATIENT GOALS Use left UE with no pain.  OBJECTIVE:   PATIENT SURVEYS: FOTO.  POSTURE: Generally good.  UPPER EXTREMITY ROM:   Full active left shoulder range of motion.  UPPER EXTREMITY MMT:  Left shoulder flexion strength decreased to 4-/5 likely due to pain.  IR/ER tested with elbow by side is 4+/5.  SHOULDER SPECIAL TESTS:  Impingement tests: Left shoulder pain reproduction with Impingement testing.  Rotator cuff assessment: Drop arm test: negative  Biceps assessment: Speed's test: positive   PALPATION:  Very tender to palpation over left Bicipital groove.   TODAY'S TREATMENT:  IFC at 80-150 Hz on 40% scan x 20 minutes to patient's left shoulder.  Normal modality response following removal of modality.   PATIENT EDUCATION: Education details: Instruct in short duration ice massage. Person educated: Patient Education method: Explanation Education comprehension: verbalized understanding.   ASSESSMENT:  CLINICAL IMPRESSION: The patient presents to OPPT with c/o left shoulder pain.  She was found to be very palpably tender over her bicipital groove.  She demonstrates a positive Speed's test and did have pain reproduction with Impingement testing of her left shoulder.  She had some weakness into flexion but this appears due  to pain.  Her active range of motion is full.  A Drop Arm test is negative.  Patient will benefit from skilled physical therapy intervention to address pain and deficits.   OBJECTIVE IMPAIRMENTS decreased activity tolerance, decreased strength, and pain.   ACTIVITY LIMITATIONS lifting and caring for others  PARTICIPATION LIMITATIONS: laundry  REHAB POTENTIAL: Excellent  CLINICAL DECISION MAKING: Stable/uncomplicated  EVALUATION COMPLEXITY: Low   GOALS: Goals reviewed with patient? Yes  SHORT TERM GOALS: Target date: 11/26/2021  (Remove Blue Hyperlink)  Ind with an initial HEP. Baseline: Goal status: INITIAL   LONG TERM GOALS: Target date: 12/24/2021  (Remove Blue Hyperlink)  Ind with an advanced HEP. Baseline:  Goal status: INITIAL  2.  Perform ADL's with left shoulder pain not to exceed a 2/10. Baseline:  Goal status: INITIAL  3.  Right shoulder strength a solid 5/5. Baseline:  Goal status: INITIAL   PLAN: PT FREQUENCY: 2x/week  PT DURATION: 6 weeks  PLANNED INTERVENTIONS: Therapeutic exercises, Therapeutic activity, Patient/Family education, Self Care, Dry Needling, Electrical stimulation, Cryotherapy, Moist heat, Ultrasound, Ionotophoresis '4mg'$ /ml Dexamethasone, and Manual therapy  PLAN FOR NEXT SESSION: Combo e'stim/US, yellow theraband IR/ER, progress to RW4 and PRE's.     Izaya Netherton, [place], PT 11/12/2021, 10:21 AM

## 2021-11-13 NOTE — Progress Notes (Signed)
BOTH SHOULDERS WERE EVALUATED PER PT NOTE

## 2021-11-14 ENCOUNTER — Ambulatory Visit: Payer: 59 | Admitting: Physical Therapy

## 2021-11-14 DIAGNOSIS — M25512 Pain in left shoulder: Secondary | ICD-10-CM | POA: Diagnosis not present

## 2021-11-14 DIAGNOSIS — M6281 Muscle weakness (generalized): Secondary | ICD-10-CM

## 2021-11-14 NOTE — Therapy (Addendum)
OUTPATIENT PHYSICAL THERAPY SHOULDER TREATMENT   Patient Name: Jackie Ayala MRN: [id number] DOB:07/22/1960, 61 y.o., female Today's Date: 11/14/2021   PT End of Session - 11/14/21 0949     Visit Number 2    Number of Visits 12    Date for PT Re-Evaluation 12/24/21    Authorization Type FOTO AT LEAST EVERY 5TH VISIT.  PROGRESS NOTE AT 10TH VISIT.  KX MODIFIER AFTER 15 VISITS.    PT Start Time 0900    PT Stop Time 2353    PT Time Calculation (min) 53 min    Activity Tolerance Patient tolerated treatment well    Behavior During Therapy [facility] for tasks assessed/performed             Past Medical History:  Diagnosis Date   Hypertension    Past Surgical History:  Procedure Laterality Date   ABDOMINAL HYSTERECTOMY  2004   ovaries remain   CHOLECYSTECTOMY     Patient Active Problem List   Diagnosis Date Noted   Primary osteoarthritis involving multiple joints 09/04/2021   Right hand weakness 06/19/2021   Hyperlipidemia 03/05/2021   Prediabetes 05/13/2020   Obesity (BMI 35.0-39.9 without comorbidity) 08/27/2016   Essential hypertension 06/12/2014   GERD (gastroesophageal reflux disease) 06/12/2014   REFERRING PROVIDER: Claretta Fraise MD  REFERRING DIAG: Acute pain of left shoulder.  THERAPY DIAG:  Acute pain of left shoulder  Muscle weakness (generalized)  Rationale for Evaluation and Treatment Rehabilitation  ONSET DATE: One week+  SUBJECTIVE:                                                                                                                                                                                      SUBJECTIVE STATEMENT: Felt good after first treatment then pain came back.   PERTINENT HISTORY: H/o right hand pain (recent OT visit), lumbar surgery, HTN.  PAIN:  Are you having pain? Yes: NPRS scale: 4/10 Pain location: Left anterior shoulder. Pain description: Ache, sore. Aggravating factors: As above. Relieving factors: As  above.   OBJECTIVE:     TODAY'S TREATMENT:  Combo e'stim/US at 1.50 W/CM2 x 12 minutes f/b STW/M x 11 minutes f/b IFC at 80-150 Hz on 40% scan x 20 minutes to patient's left shoulder.  Normal modality response following removal of modality.   ASSESSMENT:  CLINICAL IMPRESSION: Good response to first treatment and she felt better after today's treatment.  CC is tenderness over her left bicipital groove and middle deltoid region.    GOALS: Goals reviewed with patient? Yes  SHORT TERM GOALS: Target date: 11/28/2021  (Remove Blue Hyperlink)  Ind with an initial HEP. Baseline: Goal status: INITIAL  LONG TERM GOALS: Target date: 12/26/2021  (Remove Blue Hyperlink)  Ind with an advanced HEP. Baseline:  Goal status: INITIAL  2.  Perform ADL's with left shoulder pain not to exceed a 2/10. Baseline:  Goal status: INITIAL  3.  Right shoulder strength a solid 5/5. Baseline:  Goal status: INITIAL   PLAN: PT FREQUENCY: 2x/week  PT DURATION: 6 weeks  PLANNED INTERVENTIONS: Therapeutic exercises, Therapeutic activity, Patient/Family education, Self Care, Dry Needling, Electrical stimulation, Cryotherapy, Moist heat, Ultrasound, Ionotophoresis '4mg'$ /ml Dexamethasone, and Manual therapy  PLAN FOR NEXT SESSION: Combo e'stim/US, yellow theraband IR/ER, progress to RW4 and PRE's.     Launi Asencio, [place], PT 11/14/2021, 10:01 AM

## 2021-11-17 ENCOUNTER — Ambulatory Visit: Payer: 59 | Admitting: Physical Therapy

## 2021-11-17 ENCOUNTER — Encounter: Payer: Self-pay | Admitting: Physical Therapy

## 2021-11-17 DIAGNOSIS — M6281 Muscle weakness (generalized): Secondary | ICD-10-CM

## 2021-11-17 DIAGNOSIS — M25512 Pain in left shoulder: Secondary | ICD-10-CM | POA: Diagnosis not present

## 2021-11-17 NOTE — Therapy (Addendum)
OUTPATIENT PHYSICAL THERAPY SHOULDER TREATMENT   Patient Name: Jackie Ayala MRN: [id number] DOB:03/18/1961, 61 y.o., female Today's Date: 11/17/2021   PT End of Session - 11/17/21 1118     Visit Number 3    Number of Visits 12    Date for PT Re-Evaluation 12/24/21    Authorization Type FOTO AT LEAST EVERY 5TH VISIT.  PROGRESS NOTE AT 10TH VISIT.  KX MODIFIER AFTER 15 VISITS.    PT Start Time 0815    PT Stop Time 0909    PT Time Calculation (min) 54 min    Activity Tolerance Patient tolerated treatment well    Behavior During Therapy [facility] for tasks assessed/performed             Past Medical History:  Diagnosis Date   Hypertension    Past Surgical History:  Procedure Laterality Date   ABDOMINAL HYSTERECTOMY  2004   ovaries remain   CHOLECYSTECTOMY     Patient Active Problem List   Diagnosis Date Noted   Primary osteoarthritis involving multiple joints 09/04/2021   Right hand weakness 06/19/2021   Hyperlipidemia 03/05/2021   Prediabetes 05/13/2020   Obesity (BMI 35.0-39.9 without comorbidity) 08/27/2016   Essential hypertension 06/12/2014   GERD (gastroesophageal reflux disease) 06/12/2014   REFERRING PROVIDER: Claretta Fraise MD  REFERRING DIAG: Acute pain of left shoulder.  THERAPY DIAG:  Acute pain of left shoulder  Muscle weakness (generalized)  Rationale for Evaluation and Treatment Rehabilitation  ONSET DATE: One week+  SUBJECTIVE:                                                                                                                                                                                      SUBJECTIVE STATEMENT: Did good after last treatment.  Did some raking which increased pain but doing better now. PERTINENT HISTORY: H/o right hand pain (recent OT visit), lumbar surgery, HTN.  PAIN:  Are you having pain? Yes: NPRS scale: 4/10 Pain location: Left anterior shoulder. Pain description: Ache, sore. Aggravating factors: As  above. Relieving factors: As above.   OBJECTIVE:     TODAY'S TREATMENT:  UBE at 120 RPM's x 6 minutes f/b Combo e'stim/US at 1.50 W/CM2 x 8 minutes f/b STW/M x 9 minutes f/b IFC at 80-150 Hz on 40% scan x 20 minutes to patient's left shoulder.  Normal modality response following removal of modality.   ASSESSMENT:  CLINICAL IMPRESSION: Excellent job today.  She enjoyed the UBE.  Raking over the weekend increased her pain but she is better now. GOALS: Goals reviewed with patient? Yes  SHORT TERM GOALS: Target date: 12/01/2021  (Remove Blue Hyperlink)  Ind with an initial  HEP. Baseline: Goal status: INITIAL   LONG TERM GOALS: Target date: 12/29/2021  (Remove Blue Hyperlink)  Ind with an advanced HEP. Baseline:  Goal status: INITIAL  2.  Perform ADL's with left shoulder pain not to exceed a 2/10. Baseline:  Goal status: INITIAL  3.  Right shoulder strength a solid 5/5. Baseline:  Goal status: INITIAL   PLAN: PT FREQUENCY: 2x/week  PT DURATION: 6 weeks  PLANNED INTERVENTIONS: Therapeutic exercises, Therapeutic activity, Patient/Family education, Self Care, Dry Needling, Electrical stimulation, Cryotherapy, Moist heat, Ultrasound, Ionotophoresis '4mg'$ /ml Dexamethasone, and Manual therapy  PLAN FOR NEXT SESSION: Combo e'stim/US, yellow theraband IR/ER, progress to RW4 and PRE's.     Colbie Danner, [place], PT 11/17/2021, 12:23 PM

## 2021-11-19 ENCOUNTER — Ambulatory Visit: Payer: 59 | Admitting: Physical Therapy

## 2021-11-19 ENCOUNTER — Encounter: Payer: Self-pay | Admitting: Physical Therapy

## 2021-11-19 DIAGNOSIS — M25512 Pain in left shoulder: Secondary | ICD-10-CM

## 2021-11-19 DIAGNOSIS — M6281 Muscle weakness (generalized): Secondary | ICD-10-CM

## 2021-11-19 NOTE — Therapy (Addendum)
OUTPATIENT PHYSICAL THERAPY SHOULDER TREATMENT   Patient Name: Jackie Ayala MRN: [id number] DOB:October 31, 1960, 61 y.o., female Today's Date: 11/19/2021   PT End of Session - 11/19/21 0934     Visit Number 4    Number of Visits 12    Date for PT Re-Evaluation 12/24/21    Authorization Type FOTO AT LEAST EVERY 5TH VISIT.  PROGRESS NOTE AT 10TH VISIT.  KX MODIFIER AFTER 15 VISITS.    PT Start Time 0816    PT Stop Time 0907    PT Time Calculation (min) 51 min    Activity Tolerance Patient tolerated treatment well    Behavior During Therapy [facility] for tasks assessed/performed             Past Medical History:  Diagnosis Date   Hypertension    Past Surgical History:  Procedure Laterality Date   ABDOMINAL HYSTERECTOMY  2004   ovaries remain   CHOLECYSTECTOMY     Patient Active Problem List   Diagnosis Date Noted   Primary osteoarthritis involving multiple joints 09/04/2021   Right hand weakness 06/19/2021   Hyperlipidemia 03/05/2021   Prediabetes 05/13/2020   Obesity (BMI 35.0-39.9 without comorbidity) 08/27/2016   Essential hypertension 06/12/2014   GERD (gastroesophageal reflux disease) 06/12/2014   REFERRING PROVIDER: Claretta Fraise MD  REFERRING DIAG: Acute pain of left shoulder.  THERAPY DIAG:  Acute pain of left shoulder  Muscle weakness (generalized)  Rationale for Evaluation and Treatment Rehabilitation  ONSET DATE: One week+  SUBJECTIVE:                                                                                                                                                                                      SUBJECTIVE STATEMENT: Tweaked both shoulder helping husband and using crank on bed. PERTINENT HISTORY: H/o right hand pain (recent OT visit), lumbar surgery, HTN.  PAIN:  Are you having pain? Yes: NPRS scale: 4-5/10 Pain location: Left anterior shoulder. Pain description: Ache, sore. Aggravating factors: As above. Relieving factors: As  above.   OBJECTIVE:     TODAY'S TREATMENT:  UBE at 120 RPM's x 8 minutes f/b Combo e'stim/US at 1.50 W/CM2 x 8 minutes f/b STW/M x 8 minutes f/b IFC at 80-150 Hz on 40% scan x 20 minutes to patient's left shoulder.  Normal modality response following removal of modality.   ASSESSMENT:  CLINICAL IMPRESSION: Patient assisting and having to crank bed resulted in bilateral shoulder flare-ups.  She did well with treatment today tolerating without complaint with normal modality response following removal of modality. GOALS: Goals reviewed with patient? Yes  SHORT TERM GOALS: Target date: 12/03/2021  (Remove Blue Hyperlink)  Ind  with an initial HEP. Baseline: Goal status: INITIAL   LONG TERM GOALS: Target date: 12/31/2021  (Remove Blue Hyperlink)  Ind with an advanced HEP. Baseline:  Goal status: INITIAL  2.  Perform ADL's with left shoulder pain not to exceed a 2/10. Baseline:  Goal status: INITIAL  3.  Right shoulder strength a solid 5/5. Baseline:  Goal status: INITIAL   PLAN: PT FREQUENCY: 2x/week  PT DURATION: 6 weeks  PLANNED INTERVENTIONS: Therapeutic exercises, Therapeutic activity, Patient/Family education, Self Care, Dry Needling, Electrical stimulation, Cryotherapy, Moist heat, Ultrasound, Ionotophoresis '4mg'$ /ml Dexamethasone, and Manual therapy  PLAN FOR NEXT SESSION: Combo e'stim/US, yellow theraband IR/ER, progress to RW4 and PRE's.     Money Mckeithan, [place], PT 11/19/2021, 9:38 AM

## 2021-11-26 ENCOUNTER — Ambulatory Visit: Payer: 59 | Attending: Family Medicine | Admitting: Physical Therapy

## 2021-11-26 ENCOUNTER — Encounter: Payer: Self-pay | Admitting: Physical Therapy

## 2021-11-26 DIAGNOSIS — M25511 Pain in right shoulder: Secondary | ICD-10-CM | POA: Insufficient documentation

## 2021-11-26 DIAGNOSIS — M25512 Pain in left shoulder: Secondary | ICD-10-CM | POA: Insufficient documentation

## 2021-11-26 DIAGNOSIS — M6281 Muscle weakness (generalized): Secondary | ICD-10-CM | POA: Insufficient documentation

## 2021-11-26 NOTE — Therapy (Addendum)
OUTPATIENT PHYSICAL THERAPY SHOULDER TREATMENT   Patient Name: Jackie Ayala MRN: [id number] DOB:07/14/1960, 61 y.o., female Today's Date: 11/26/2021   PT End of Session - 11/26/21 0854     Visit Number 5    Number of Visits 12    Date for PT Re-Evaluation 12/24/21    Authorization Type FOTO AT LEAST EVERY 5TH VISIT.  PROGRESS NOTE AT 10TH VISIT.  KX MODIFIER AFTER 15 VISITS.    PT Start Time 0815    PT Stop Time 0906    PT Time Calculation (min) 51 min    Activity Tolerance Patient tolerated treatment well    Behavior During Therapy [facility] for tasks assessed/performed             Past Medical History:  Diagnosis Date   Hypertension    Past Surgical History:  Procedure Laterality Date   ABDOMINAL HYSTERECTOMY  2004   ovaries remain   CHOLECYSTECTOMY     Patient Active Problem List   Diagnosis Date Noted   Primary osteoarthritis involving multiple joints 09/04/2021   Right hand weakness 06/19/2021   Hyperlipidemia 03/05/2021   Prediabetes 05/13/2020   Obesity (BMI 35.0-39.9 without comorbidity) 08/27/2016   Essential hypertension 06/12/2014   GERD (gastroesophageal reflux disease) 06/12/2014   REFERRING PROVIDER: Claretta Fraise MD  REFERRING DIAG: Acute pain of left shoulder.  THERAPY DIAG:  Acute pain of left shoulder - Plan: PT plan of care cert/re-cert  Rationale for Evaluation and Treatment Rehabilitation  ONSET DATE: One week+  SUBJECTIVE:                                                                                                                                                                                      SUBJECTIVE STATEMENT: Did good after last treatment.  Did some yard work and that increased pain some. PERTINENT HISTORY: H/o right hand pain (recent OT visit), lumbar surgery, HTN.  PAIN:  Are you having pain? Yes: NPRS scale: 4-5/10 Pain location: Left anterior shoulder. Pain description: Ache, sore. Aggravating factors: As  above. Relieving factors: As above.   OBJECTIVE:     TODAY'S TREATMENT:  UBE at 120 RPM's x 8 minutes f/b Combo e'stim/US at 1.50 W/CM2 x 8 minutes f/b STW/M x 8 minutes f/b IFC at 80-150 Hz on 40% scan x 15 minutes to patient's left shoulder.  Normal modality response following removal of modality.   ASSESSMENT:  CLINICAL IMPRESSION: Patient is responding well to treatment.  Able to do more with less pain.  She continues to c/o right shoulder pain and would like to add treatment to this region as well. GOALS: Goals reviewed with patient? Yes  SHORT  TERM GOALS: Target date: 12/10/2021  (Remove Blue Hyperlink)  Ind with an initial HEP. Baseline: Goal status: INITIAL   LONG TERM GOALS: Target date: 01/07/2022  (Remove Blue Hyperlink)  Ind with an advanced HEP. Baseline:  Goal status: INITIAL  2.  Perform ADL's with left shoulder pain not to exceed a 2/10. Baseline:  Goal status: INITIAL  3.  Right shoulder strength a solid 5/5. Baseline:  Goal status: INITIAL   PLAN: PT FREQUENCY: 2x/week  PT DURATION: 6 weeks  PLANNED INTERVENTIONS: Therapeutic exercises, Therapeutic activity, Patient/Family education, Self Care, Dry Needling, Electrical stimulation, Cryotherapy, Moist heat, Ultrasound, Ionotophoresis '4mg'$ /ml Dexamethasone, and Manual therapy  PLAN FOR NEXT SESSION: Combo e'stim/US, yellow theraband IR/ER, progress to RW4 and PRE's.     Dawnn Nam, [place], PT 11/26/2021, 11:28 AM

## 2021-12-01 ENCOUNTER — Ambulatory Visit: Payer: 59 | Admitting: Physical Therapy

## 2021-12-01 DIAGNOSIS — M25511 Pain in right shoulder: Secondary | ICD-10-CM

## 2021-12-01 DIAGNOSIS — M25512 Pain in left shoulder: Secondary | ICD-10-CM

## 2021-12-01 DIAGNOSIS — M6281 Muscle weakness (generalized): Secondary | ICD-10-CM

## 2021-12-01 NOTE — Therapy (Signed)
OUTPATIENT PHYSICAL THERAPY SHOULDER EVALUATION   Patient Name: Jackie Ayala MRN: [id number] DOB:04-Mar-1961, 61 y.o., female Today's Date: 12/01/2021   PT End of Session - 12/01/21 0859     Visit Number 6    Number of Visits 12    Date for PT Re-Evaluation 12/24/21    Authorization Type FOTO AT LEAST EVERY 5TH VISIT.  PROGRESS NOTE AT 10TH VISIT.  KX MODIFIER AFTER 15 VISITS.    PT Start Time 0815    PT Stop Time 0902    PT Time Calculation (min) 47 min    Activity Tolerance Patient tolerated treatment well    Behavior During Therapy [facility] for tasks assessed/performed              Past Medical History:  Diagnosis Date   Hypertension    Past Surgical History:  Procedure Laterality Date   ABDOMINAL HYSTERECTOMY  2004   ovaries remain   CHOLECYSTECTOMY     Patient Active Problem List   Diagnosis Date Noted   Primary osteoarthritis involving multiple joints 09/04/2021   Right hand weakness 06/19/2021   Hyperlipidemia 03/05/2021   Prediabetes 05/13/2020   Obesity (BMI 35.0-39.9 without comorbidity) 08/27/2016   Essential hypertension 06/12/2014   GERD (gastroesophageal reflux disease) 06/12/2014   REFERRING PROVIDER: Claretta Fraise MD  REFERRING DIAG: Acute pain of left shoulder.  THERAPY DIAG:  Acute pain of left shoulder  Muscle weakness (generalized)  Acute pain of right shoulder  Rationale for Evaluation and Treatment Rehabilitation  ONSET DATE: One week+  SUBJECTIVE:                                                                                                                                                                                      SUBJECTIVE STATEMENT: Did good after last treatment.  Lifted a heavy swing and flared-up my left shoulder.  Took a pain pill.  Pain at a 4-5/10 today.  Right shoulder pain tends to come and go. PERTINENT HISTORY: H/o right hand pain (recent OT visit), lumbar surgery, HTN.  PAIN:  Are you having pain? Yes: NPRS  scale: 4-5/10 Pain location: Left anterior shoulder. Pain description: Ache, sore. Aggravating factors: As above. Relieving factors: As above.   OBJECTIVE:     TODAY'S TREATMENT:  UBE at 120 RPM's x 8 minutes f/b Combo e'stim/US at 1.50 W/CM2 x 8 minutes f/b STW/M x 7 minutes f/b IFC at 80-150 Hz on 40% scan x 15 minutes to patient's left shoulder.  Normal modality response following removal of modality.   ASSESSMENT:  CLINICAL IMPRESSION:  Referral for right shoulder pain.  She states this pain tends to come and go and  isn't as bad as the left.  She exhibits full right shoulder range of motion and strength is essentially normal.  She has pain complaints over her right ACJ and bicipital groove.  Mild pain reproduction with a right shoulder Impingement test.  Left shoulder feeling better after treatment.   GOALS: Goals reviewed with patient? Yes  SHORT TERM GOALS: Target date: 12/15/2021  (Remove Blue Hyperlink)  Ind with an initial HEP. Baseline: Goal status: INITIAL   LONG TERM GOALS: Target date: 01/12/2022  (Remove Blue Hyperlink)  Ind with an advanced HEP. Baseline:  Goal status: INITIAL  2.  Perform ADL's with left shoulder pain not to exceed a 2/10. Baseline:  Goal status: INITIAL  3.  Left shoulder strength a solid 5/5. Baseline:  Goal status: INITIAL  4.  No right shoulder pain with performance of ADL's.   PLAN: PT FREQUENCY: 2x/week  PT DURATION: 6 weeks  PLANNED INTERVENTIONS: Therapeutic exercises, Therapeutic activity, Patient/Family education, Self Care, Dry Needling, Electrical stimulation, Cryotherapy, Moist heat, Ultrasound, Ionotophoresis '4mg'$ /ml Dexamethasone, and Manual therapy  PLAN FOR NEXT SESSION: Combo e'stim/US, yellow theraband IR/ER, progress to RW4 and PRE's.     Daziyah Cogan, [place], PT 12/01/2021, 9:37 AM

## 2021-12-03 ENCOUNTER — Encounter: Payer: Self-pay | Admitting: Physical Therapy

## 2021-12-03 ENCOUNTER — Ambulatory Visit: Payer: 59 | Admitting: Physical Therapy

## 2021-12-03 DIAGNOSIS — M25512 Pain in left shoulder: Secondary | ICD-10-CM

## 2021-12-03 NOTE — Therapy (Signed)
OUTPATIENT PHYSICAL THERAPY SHOULDER EVALUATION   Patient Name: Jackie Ayala MRN: [id number] DOB:22-Aug-1960, 61 y.o., female Today's Date: 12/03/2021   PT End of Session - 12/03/21 0930     Visit Number 7    Number of Visits 12    Date for PT Re-Evaluation 12/24/21    Authorization Type FOTO AT LEAST EVERY 5TH VISIT.  PROGRESS NOTE AT 10TH VISIT.  KX MODIFIER AFTER 15 VISITS.    PT Start Time 0816    PT Stop Time 0911    PT Time Calculation (min) 55 min    Activity Tolerance Patient tolerated treatment well    Behavior During Therapy [facility] for tasks assessed/performed              Past Medical History:  Diagnosis Date   Hypertension    Past Surgical History:  Procedure Laterality Date   ABDOMINAL HYSTERECTOMY  2004   ovaries remain   CHOLECYSTECTOMY     Patient Active Problem List   Diagnosis Date Noted   Primary osteoarthritis involving multiple joints 09/04/2021   Right hand weakness 06/19/2021   Hyperlipidemia 03/05/2021   Prediabetes 05/13/2020   Obesity (BMI 35.0-39.9 without comorbidity) 08/27/2016   Essential hypertension 06/12/2014   GERD (gastroesophageal reflux disease) 06/12/2014   REFERRING PROVIDER: Claretta Fraise MD  REFERRING DIAG: Acute pain of left shoulder.  THERAPY DIAG:  Acute pain of left shoulder  Rationale for Evaluation and Treatment Rehabilitation  ONSET DATE: One week+  SUBJECTIVE:                                                                                                                                                                                      SUBJECTIVE STATEMENT: Lifted a mattress.   PERTINENT HISTORY: H/o right hand pain (recent OT visit), lumbar surgery, HTN.  PAIN:  Are you having pain? Yes: NPRS scale: 4-5/10 Pain location: Left anterior shoulder. Pain description: Ache, sore. Aggravating factors: As above. Relieving factors: As above.   OBJECTIVE:     TODAY'S TREATMENT:  UBE at 120 RPM's x 8  minutes f/b RW 4 to left shoulder and IR/ER on right all exercise performed to fatigue (yellow theraband) f/b STW/M x 8 minutes f/b IFC at 80-150 Hz on 40% scan x 20 minutes to patient's left shoulder.  Normal modality response following removal of modality.   ASSESSMENT:  CLINICAL IMPRESSION:  Patient doing well in spite of flare-ups due to lifting a swing and a mattress.  She did great with the addition of theraband resisted shoulder exercise.  Yellow and red theraband provided for home exercise.   GOALS: Goals reviewed with patient? Yes  SHORT TERM GOALS:  Target date: 12/17/2021  (Remove Blue Hyperlink)  Ind with an initial HEP. Baseline: Goal status: INITIAL   LONG TERM GOALS: Target date: 01/14/2022  (Remove Blue Hyperlink)  Ind with an advanced HEP. Baseline:  Goal status: INITIAL  2.  Perform ADL's with left shoulder pain not to exceed a 2/10. Baseline:  Goal status: INITIAL  3.  Left shoulder strength a solid 5/5. Baseline:  Goal status: INITIAL  4.  No right shoulder pain with performance of ADL's.   PLAN: PT FREQUENCY: 2x/week  PT DURATION: 6 weeks  PLANNED INTERVENTIONS: Therapeutic exercises, Therapeutic activity, Patient/Family education, Self Care, Dry Needling, Electrical stimulation, Cryotherapy, Moist heat, Ultrasound, Ionotophoresis '4mg'$ /ml Dexamethasone, and Manual therapy  PLAN FOR NEXT SESSION: Combo e'stim/US, yellow theraband IR/ER, progress to RW4 and PRE's.     Rhylen Pulido, [place], PT 12/03/2021, 10:22 AM

## 2021-12-08 ENCOUNTER — Encounter: Payer: Self-pay | Admitting: Physical Therapy

## 2021-12-08 ENCOUNTER — Ambulatory Visit: Payer: 59 | Admitting: Physical Therapy

## 2021-12-08 DIAGNOSIS — M25511 Pain in right shoulder: Secondary | ICD-10-CM

## 2021-12-08 DIAGNOSIS — M6281 Muscle weakness (generalized): Secondary | ICD-10-CM

## 2021-12-08 DIAGNOSIS — M25512 Pain in left shoulder: Secondary | ICD-10-CM

## 2021-12-08 NOTE — Therapy (Signed)
OUTPATIENT PHYSICAL THERAPY SHOULDER EVALUATION   Patient Name: Jackie Ayala MRN: [id number] DOB:11-16-1960, 61 y.o., female Today's Date: 12/08/2021   PT End of Session - 12/08/21 0824     Visit Number 8    Number of Visits 12    Date for PT Re-Evaluation 12/24/21    Authorization Type FOTO AT LEAST EVERY 5TH VISIT.  PROGRESS NOTE AT 10TH VISIT.  KX MODIFIER AFTER 15 VISITS.    PT Start Time 0816    PT Stop Time 0905    PT Time Calculation (min) 49 min    Activity Tolerance Patient tolerated treatment well              Past Medical History:  Diagnosis Date   Hypertension    Past Surgical History:  Procedure Laterality Date   ABDOMINAL HYSTERECTOMY  2004   ovaries remain   CHOLECYSTECTOMY     Patient Active Problem List   Diagnosis Date Noted   Primary osteoarthritis involving multiple joints 09/04/2021   Right hand weakness 06/19/2021   Hyperlipidemia 03/05/2021   Prediabetes 05/13/2020   Obesity (BMI 35.0-39.9 without comorbidity) 08/27/2016   Essential hypertension 06/12/2014   GERD (gastroesophageal reflux disease) 06/12/2014   REFERRING PROVIDER: Claretta Fraise MD  REFERRING DIAG: Acute pain of left shoulder.  THERAPY DIAG:  Acute pain of left shoulder  Muscle weakness (generalized)  Acute pain of right shoulder  Rationale for Evaluation and Treatment Rehabilitation  ONSET DATE: One week+  SUBJECTIVE:                                                                                                                                                                                      SUBJECTIVE STATEMENT: Pushing husband in wheelchair flared-up shoulders.  Took a pain medication. PERTINENT HISTORY: H/o right hand pain (recent OT visit), lumbar surgery, HTN.  PAIN:  Are you having pain? Yes: NPRS scale: 3/10 Pain location: Left anterior shoulder. Pain description: Ache, sore. Aggravating factors: As above. Relieving factors: As  above.   OBJECTIVE:     TODAY'S TREATMENT: UBE at 120 RPM's x 8 minutes f/b RW 4 to left shoulder with red theraband and full can with 1# f/b combo e'stim/US at 1.50 W/CM x 10 minutes f/b IFC at 80-150 Hz on 40% scan x 20 minutes to patient's left shoulder.  Normal modality response following removal of modality.  HEP:  Wilbur by [place] Airanna Partin Sep 18th, 2023 View at [email or web address] using code: [email or web address] Page 1 of 1 1 Exercise Standing Abduction full range Standing with shoulders relaxed and shoulder blades squeezed together, lift arm to full range in "T" position while keeping good  posture and avoiding shoulder shrugging. Repeat 10 Times Complete 2 Sets Perform 2 Times a Day  ASSESSMENT:  CLINICAL IMPRESSION:  Patient doing well in spite of flare-ups due to pushing   She did great with the addition of full can with 1#.   GOALS: Goals reviewed with patient? Yes  SHORT TERM GOALS: Target date: 12/22/2021  (Remove Blue Hyperlink)  Ind with an initial HEP. Baseline: Goal status: INITIAL   LONG TERM GOALS: Target date: 01/19/2022  (Remove Blue Hyperlink)  Ind with an advanced HEP. Baseline:  Goal status: INITIAL  2.  Perform ADL's with left shoulder pain not to exceed a 2/10. Baseline:  Goal status: INITIAL  3.  Left shoulder strength a solid 5/5. Baseline:  Goal status: INITIAL  4.  No right shoulder pain with performance of ADL's.   PLAN: PT FREQUENCY: 2x/week  PT DURATION: 6 weeks  PLANNED INTERVENTIONS: Therapeutic exercises, Therapeutic activity, Patient/Family education, Self Care, Dry Needling, Electrical stimulation, Cryotherapy, Moist heat, Ultrasound, Ionotophoresis '4mg'$ /ml Dexamethasone, and Manual therapy  PLAN FOR NEXT SESSION: Combo e'stim/US, yellow theraband IR/ER, progress to RW4 and PRE's.     James Senn, [place], PT 12/08/2021, 9:26 AM

## 2021-12-10 ENCOUNTER — Encounter: Payer: Self-pay | Admitting: Physical Therapy

## 2021-12-10 ENCOUNTER — Ambulatory Visit: Payer: 59 | Admitting: Physical Therapy

## 2021-12-10 DIAGNOSIS — M25512 Pain in left shoulder: Secondary | ICD-10-CM

## 2021-12-10 NOTE — Therapy (Addendum)
OUTPATIENT PHYSICAL THERAPY SHOULDER EVALUATION   Patient Name: Jackie Ayala MRN: [id number] DOB:Aug 07, 1960, 61 y.o., female Today's Date: 12/10/2021   PT End of Session - 12/10/21 1028     Visit Number 9    Number of Visits 12    Date for PT Re-Evaluation 12/24/21    Authorization Type FOTO AT LEAST EVERY 5TH VISIT.  PROGRESS NOTE AT 10TH VISIT.  KX MODIFIER AFTER 15 VISITS.    PT Start Time 0816    PT Stop Time 0910    PT Time Calculation (min) 54 min    Activity Tolerance Patient tolerated treatment well    Behavior During Therapy [facility] for tasks assessed/performed              Past Medical History:  Diagnosis Date   Hypertension    Past Surgical History:  Procedure Laterality Date   ABDOMINAL HYSTERECTOMY  2004   ovaries remain   CHOLECYSTECTOMY     Patient Active Problem List   Diagnosis Date Noted   Primary osteoarthritis involving multiple joints 09/04/2021   Right hand weakness 06/19/2021   Hyperlipidemia 03/05/2021   Prediabetes 05/13/2020   Obesity (BMI 35.0-39.9 without comorbidity) 08/27/2016   Essential hypertension 06/12/2014   GERD (gastroesophageal reflux disease) 06/12/2014   REFERRING PROVIDER: Claretta Fraise MD  REFERRING DIAG: Acute pain of left shoulder.  THERAPY DIAG:  Acute pain of left shoulder  Rationale for Evaluation and Treatment Rehabilitation  ONSET DATE: One week+  SUBJECTIVE:                                                                                                                                                                                      SUBJECTIVE STATEMENT: About the same today. PERTINENT HISTORY: H/o right hand pain (recent OT visit), lumbar surgery, HTN.  PAIN:  Are you having pain? Yes: NPRS scale: 3/10 Pain location: Left anterior shoulder. Pain description: Ache, sore. Aggravating factors: As above. Relieving factors: As above.   OBJECTIVE:     TODAY'S TREATMENT: UBE at 120 RPM's x 10  minutes f/b  f/b combo e'stim/US at 1.50 W/CM x 8 minutes to patient's left anterior shoulder f/b STW/M x 5 minutes including IASTM f/b IFC at 80-150 Hz on 40% scan x 20 minutes to patient's left shoulder.  Normal modality response following removal of modality.    ASSESSMENT:  CLINICAL IMPRESSION:  Good day and good response to treatment.  She is trying to avoid, as much as possible, doing things that flare her up. GOALS: Goals reviewed with patient? Yes  SHORT TERM GOALS: Target date: 12/24/2021  (Remove Blue Hyperlink)  Ind with an initial HEP. Baseline: Goal  status: INITIAL   LONG TERM GOALS: Target date: 01/21/2022  (Remove Blue Hyperlink)  Ind with an advanced HEP. Baseline:  Goal status: INITIAL  2.  Perform ADL's with left shoulder pain not to exceed a 2/10. Baseline:  Goal status: INITIAL  3.  Left shoulder strength a solid 5/5. Baseline:  Goal status: INITIAL  4.  No right shoulder pain with performance of ADL's.   PLAN: PT FREQUENCY: 2x/week  PT DURATION: 6 weeks  PLANNED INTERVENTIONS: Therapeutic exercises, Therapeutic activity, Patient/Family education, Self Care, Dry Needling, Electrical stimulation, Cryotherapy, Moist heat, Ultrasound, Ionotophoresis '4mg'$ /ml Dexamethasone, and Manual therapy  PLAN FOR NEXT SESSION: Combo e'stim/US, yellow theraband IR/ER, progress to RW4 and PRE's.     Fenix Ruppe, [place], PT 12/10/2021, 11:48 AM

## 2021-12-16 ENCOUNTER — Ambulatory Visit: Payer: 59 | Admitting: *Deleted

## 2021-12-16 ENCOUNTER — Encounter: Payer: Self-pay | Admitting: *Deleted

## 2021-12-16 DIAGNOSIS — M25512 Pain in left shoulder: Secondary | ICD-10-CM | POA: Diagnosis not present

## 2021-12-16 DIAGNOSIS — M25511 Pain in right shoulder: Secondary | ICD-10-CM

## 2021-12-16 DIAGNOSIS — M6281 Muscle weakness (generalized): Secondary | ICD-10-CM

## 2021-12-16 NOTE — Therapy (Signed)
OUTPATIENT PHYSICAL THERAPY SHOULDER EVALUATION   Patient Name: Jackie Ayala MRN: [id number] DOB:01-31-1961, 61 y.o., female Today's Date: 12/16/2021   PT End of Session - 12/16/21 0826     Visit Number 10    Number of Visits 12    Date for PT Re-Evaluation 12/24/21    Authorization Type FOTO AT LEAST EVERY 5TH VISIT.  PROGRESS NOTE AT 10TH VISIT.  KX MODIFIER AFTER 15 VISITS.    PT Start Time 0815    PT Stop Time 0905    PT Time Calculation (min) 50 min              Past Medical History:  Diagnosis Date   Hypertension    Past Surgical History:  Procedure Laterality Date   ABDOMINAL HYSTERECTOMY  2004   ovaries remain   CHOLECYSTECTOMY     Patient Active Problem List   Diagnosis Date Noted   Primary osteoarthritis involving multiple joints 09/04/2021   Right hand weakness 06/19/2021   Hyperlipidemia 03/05/2021   Prediabetes 05/13/2020   Obesity (BMI 35.0-39.9 without comorbidity) 08/27/2016   Essential hypertension 06/12/2014   GERD (gastroesophageal reflux disease) 06/12/2014   REFERRING PROVIDER: Claretta Fraise MD  REFERRING DIAG: Acute pain of left shoulder.  THERAPY DIAG:  Acute pain of left shoulder  Muscle weakness (generalized)  Acute pain of right shoulder  Rationale for Evaluation and Treatment Rehabilitation  ONSET DATE: One week+  SUBJECTIVE:                                                                                                                                                                                      SUBJECTIVE STATEMENT: About the same today. LT shldr 7/10 PERTINENT HISTORY: H/o right hand pain (recent OT visit), lumbar surgery, HTN.  PAIN:  Are you having pain? Yes: NPRS scale: 7/10 Pain location: Left anterior shoulder. Pain description: Ache, sore. Aggravating factors: As above. Relieving factors: As above.   OBJECTIVE:     TODAY'S TREATMENT: UBE at 120 RPM's x 10 minutes Red tband Shldr extension 2x10  pause 5 secs with focus on scap stabilization, ER 2x fatigue pain free ROM   combo e'stim/US at 1.50 W/CM x 10 minutes to patient's left anterior shoulder IFC at 80-150 Hz on 40% scan x 50 minutes to patient's left shoulder with HMP    ASSESSMENT:  CLINICAL IMPRESSION:  Pt arrived today doing fair with LT shldr and still very tender anterior aspect LT shldr. Rx focused on scap stab exs as well as ER strengthening and discussed avoiding impingement positions when performing ADL's and assisting her husband. Pt. Did well with exercises, combo and IFC.  GOALS: Goals  reviewed with patient? Yes  SHORT TERM GOALS: Target date: 12/30/2021  (Remove Blue Hyperlink)  Ind with an initial HEP. Baseline: Goal status: INITIAL   LONG TERM GOALS: Target date: 01/27/2022  (Remove Blue Hyperlink)  Ind with an advanced HEP. Baseline:  Goal status: INITIAL  2.  Perform ADL's with left shoulder pain not to exceed a 2/10. Baseline:  Goal status: INITIAL  3.  Left shoulder strength a solid 5/5. Baseline:  Goal status: INITIAL  4.  No right shoulder pain with performance of ADL's.   PLAN: PT FREQUENCY: 2x/week  PT DURATION: 6 weeks  PLANNED INTERVENTIONS: Therapeutic exercises, Therapeutic activity, Patient/Family education, Self Care, Dry Needling, Electrical stimulation, Cryotherapy, Moist heat, Ultrasound, Ionotophoresis '4mg'$ /ml Dexamethasone, and Manual therapy  PLAN FOR NEXT SESSION: Combo e'stim/US, yellow theraband IR/ER, progress to RW4 and PRE's.     Kristoff Coonradt,CHRIS, PTA 12/16/2021, 10:18 AM

## 2021-12-18 ENCOUNTER — Ambulatory Visit: Payer: 59 | Admitting: Physical Therapy

## 2021-12-18 DIAGNOSIS — M6281 Muscle weakness (generalized): Secondary | ICD-10-CM

## 2021-12-18 DIAGNOSIS — M25512 Pain in left shoulder: Secondary | ICD-10-CM

## 2021-12-18 DIAGNOSIS — M25511 Pain in right shoulder: Secondary | ICD-10-CM

## 2021-12-18 NOTE — Therapy (Signed)
OUTPATIENT PHYSICAL THERAPY SHOULDER EVALUATION   Patient Name: Jackie Ayala MRN: [id number] DOB:12/15/1960, 61 y.o., female Today's Date: 12/18/2021   PT End of Session - 12/18/21 0855     Visit Number 11    Number of Visits 12    Date for PT Re-Evaluation 12/24/21    Authorization Type FOTO AT LEAST EVERY 5TH VISIT.  PROGRESS NOTE AT 10TH VISIT.  KX MODIFIER AFTER 15 VISITS.    PT Start Time 0817    PT Stop Time 0905    PT Time Calculation (min) 48 min    Behavior During Therapy [facility] for tasks assessed/performed              Past Medical History:  Diagnosis Date   Hypertension    Past Surgical History:  Procedure Laterality Date   ABDOMINAL HYSTERECTOMY  2004   ovaries remain   CHOLECYSTECTOMY     Patient Active Problem List   Diagnosis Date Noted   Primary osteoarthritis involving multiple joints 09/04/2021   Right hand weakness 06/19/2021   Hyperlipidemia 03/05/2021   Prediabetes 05/13/2020   Obesity (BMI 35.0-39.9 without comorbidity) 08/27/2016   Essential hypertension 06/12/2014   GERD (gastroesophageal reflux disease) 06/12/2014   REFERRING PROVIDER: Claretta Fraise MD  REFERRING DIAG: Acute pain of left shoulder.  THERAPY DIAG:  Acute pain of left shoulder  Muscle weakness (generalized)  Acute pain of right shoulder  Rationale for Evaluation and Treatment Rehabilitation  ONSET DATE: One week+  SUBJECTIVE:                                                                                                                                                                                      SUBJECTIVE STATEMENT: Treatments help a lot but I flare them up caring for my husband. PERTINENT HISTORY: H/o right hand pain (recent OT visit), lumbar surgery, HTN.  PAIN:  Are you having pain? Yes: NPRS scale: 4/10 Pain location: Left anterior shoulder. Pain description: Ache, sore. Aggravating factors: As above. Relieving factors: As above. 3/10 on right    OBJECTIVE:     TODAY'S TREATMENT: Nustep level 4 x 15 minutes f/b STW/M including IASTM x 8 minutes to patient's left shoulder.  ASSESSMENT:  CLINICAL IMPRESSION: Patient pleased with how she feels after treatments.  However, caring for her husband (ie:  Transferring, etc) will aggravate and increase her pain. GOALS: Goals reviewed with patient? Yes  SHORT TERM GOALS: Target date: 01/01/2022  (Remove Blue Hyperlink)  Ind with an initial HEP. Baseline: Goal status: INITIAL   LONG TERM GOALS: Target date: 01/29/2022  (Remove Blue Hyperlink)  Ind with an advanced HEP. Baseline:  Goal status: INITIAL  2.  Perform ADL's with left shoulder pain not to exceed a 2/10. Baseline:  Goal status: INITIAL  3.  Left shoulder strength a solid 5/5. Baseline:  Goal status: INITIAL  4.  No right shoulder pain with performance of ADL's.   PLAN: PT FREQUENCY: 2x/week  PT DURATION: 6 weeks  PLANNED INTERVENTIONS: Therapeutic exercises, Therapeutic activity, Patient/Family education, Self Care, Dry Needling, Electrical stimulation, Cryotherapy, Moist heat, Ultrasound, Ionotophoresis '4mg'$ /ml Dexamethasone, and Manual therapy  PLAN FOR NEXT SESSION: Combo e'stim/US, yellow theraband IR/ER, progress to RW4 and PRE's.     Leahna Hewson, [place], PT 12/18/2021, 9:26 AM

## 2021-12-23 ENCOUNTER — Encounter: Payer: 59 | Admitting: Physical Therapy

## 2022-02-03 ENCOUNTER — Other Ambulatory Visit: Payer: Self-pay | Admitting: Family

## 2022-02-03 DIAGNOSIS — I10 Essential (primary) hypertension: Secondary | ICD-10-CM

## 2022-03-06 ENCOUNTER — Other Ambulatory Visit: Payer: Self-pay | Admitting: Family

## 2022-03-06 DIAGNOSIS — I10 Essential (primary) hypertension: Secondary | ICD-10-CM

## 2022-04-06 ENCOUNTER — Telehealth: Payer: Self-pay | Admitting: Family

## 2022-04-06 ENCOUNTER — Other Ambulatory Visit: Payer: Self-pay | Admitting: Family

## 2022-04-06 DIAGNOSIS — I10 Essential (primary) hypertension: Secondary | ICD-10-CM

## 2022-04-06 MED ORDER — ATENOLOL 50 MG PO TABS: ORAL_TABLET | ORAL | 0 refills | Status: DC

## 2022-04-06 NOTE — Telephone Encounter (Signed)
Left message informing pt that a 30d supply has been sent in and to make sure she keeps her appt in Feb.

## 2022-04-06 NOTE — Telephone Encounter (Signed)
  Prescription Request  04/06/2022  Is this a "Controlled Substance" medicine? no  Have you seen your PCP in the last 2 weeks? No pt has appt with Christy on 05/07/22 she has ran out of meds  If YES, route message to pool  -  If NO, patient needs to be scheduled for appointment.  What is the name of the medication or equipment? atenolol (TENORMIN) 50 MG tablet   Have you contacted your pharmacy to request a refill? yes   Which pharmacy would you like this sent to? [facility] [place]   Patient notified that their request is being sent to the clinical staff for review and that they should receive a response within 2 business days.

## 2022-05-07 ENCOUNTER — Encounter: Payer: Self-pay | Admitting: Family

## 2022-05-07 ENCOUNTER — Ambulatory Visit: Payer: 59 | Admitting: Family

## 2022-05-07 ENCOUNTER — Other Ambulatory Visit: Payer: Self-pay | Admitting: Family

## 2022-05-07 VITALS — BP 136/77 | HR 53 | Temp 98.0°F | Resp 20 | Ht 62.0 in | Wt 211.0 lb

## 2022-05-07 DIAGNOSIS — M5442 Lumbago with sciatica, left side: Secondary | ICD-10-CM

## 2022-05-07 DIAGNOSIS — I10 Essential (primary) hypertension: Secondary | ICD-10-CM | POA: Diagnosis not present

## 2022-05-07 DIAGNOSIS — E785 Hyperlipidemia, unspecified: Secondary | ICD-10-CM | POA: Diagnosis not present

## 2022-05-07 DIAGNOSIS — R7303 Prediabetes: Secondary | ICD-10-CM

## 2022-05-07 DIAGNOSIS — K219 Gastro-esophageal reflux disease without esophagitis: Secondary | ICD-10-CM

## 2022-05-07 DIAGNOSIS — Z0001 Encounter for general adult medical examination with abnormal findings: Secondary | ICD-10-CM

## 2022-05-07 DIAGNOSIS — M159 Polyosteoarthritis, unspecified: Secondary | ICD-10-CM | POA: Diagnosis not present

## 2022-05-07 DIAGNOSIS — Z0000 Encounter for general adult medical examination without abnormal findings: Secondary | ICD-10-CM | POA: Diagnosis not present

## 2022-05-07 LAB — BAYER DCA HB A1C WAIVED: HB A1C (BAYER DCA - WAIVED): 6.5 % — ABNORMAL HIGH (ref 4.8–5.6)

## 2022-05-07 MED ORDER — PREDNISONE 20 MG PO TABS: 40.0000 mg | ORAL_TABLET | Freq: Every day | ORAL | 0 refills | Status: AC

## 2022-05-07 MED ORDER — DICLOFENAC SODIUM 75 MG PO TBEC: 75.0000 mg | DELAYED_RELEASE_TABLET | Freq: Two times a day (BID) | ORAL | 0 refills | Status: DC

## 2022-05-07 MED ORDER — BACLOFEN 10 MG PO TABS: 10.0000 mg | ORAL_TABLET | Freq: Three times a day (TID) | ORAL | 0 refills | Status: DC

## 2022-05-07 NOTE — Patient Instructions (Signed)

## 2022-05-07 NOTE — Progress Notes (Signed)
Subjective:    Patient ID: Jackie Ayala, female    DOB: July 19, 1960, 62 y.o.   MRN: [id number]  Chief Complaint  Patient presents with   Medical Management of Chronic Issues   Pt presents to the office today for CPE and chronic follow up. She is followed by Ortho as needed and had lumbar surgery on 09/06/20.   She is morbid obese with a BMI of 38 and HTN and Hyperlipemia.    She cares for her husband.  Hypertension This is a chronic problem. The current episode started more than 1 year ago. The problem has been resolved since onset. The problem is controlled. Associated symptoms include malaise/fatigue. Pertinent negatives include no blurred vision, peripheral edema or shortness of breath. Risk factors for coronary artery disease include dyslipidemia, obesity and sedentary lifestyle. The current treatment provides moderate improvement.  Gastroesophageal Reflux She complains of belching, heartburn and a hoarse voice. This is a chronic problem. The current episode started more than 1 year ago. The problem occurs occasionally. Risk factors include obesity. She has tried a PPI for the symptoms. The treatment provided moderate relief.  Arthritis Presents for follow-up visit. She complains of pain and stiffness. She reports no joint warmth. The symptoms have been stable. Affected locations include the left hip, right hip and right MCP. Her pain is at a severity of 6/10.  Hyperlipidemia This is a chronic problem. The current episode started more than 1 year ago. The problem is controlled. Exacerbating diseases include obesity. Pertinent negatives include no shortness of breath. Current antihyperlipidemic treatment includes statins. The current treatment provides moderate improvement of lipids. Risk factors for coronary artery disease include dyslipidemia, hypertension, a sedentary lifestyle and post-menopausal.  Diabetes She presents for her follow-up diabetic visit. Diabetes type: prediabetes.  Pertinent negatives for diabetes include no blurred vision and no foot paresthesias. (Does not check at home)  Back Pain This is a new problem. The current episode started more than 1 month ago. The problem occurs intermittently. The pain is present in the gluteal. The quality of the pain is described as aching. The pain radiates to the left thigh. The pain is at a severity of 6/10. The pain is mild. She has tried bed rest and NSAIDs for the symptoms. The treatment provided mild relief.      Review of Systems  Constitutional:  Positive for malaise/fatigue.  HENT:  Positive for hoarse voice.   Eyes:  Negative for blurred vision.  Respiratory:  Negative for shortness of breath.   Gastrointestinal:  Positive for heartburn.  Musculoskeletal:  Positive for arthritis, back pain and stiffness.  All other systems reviewed and are negative.  Family History  Problem Relation Age of Onset   Diabetes Mother    Breast cancer Mother 57   COPD Mother    Hypertension Mother    Heart disease Father        Heart attack   Social History   Socioeconomic History   Marital status: Married    Spouse name: Not on file   Number of children: Not on file   Years of education: Not on file   Highest education level: Not on file  Occupational History   Not on file  Tobacco Use   Smoking status: Never   Smokeless tobacco: Never  Vaping Use   Vaping Use: Never used  Substance and Sexual Activity   Alcohol use: No   Drug use: No   Sexual activity: Not Currently    Birth control/protection:  Surgical    Comment: Hyst/ovaries remain  Other Topics Concern   Not on file  Social History Narrative   Not on file   Social Determinants of Health   Financial Resource Strain: Not on file  Food Insecurity: Not on file  Transportation Needs: Not on file  Physical Activity: Not on file  Stress: Not on file  Social Connections: Not on file       Objective:   Physical Exam Vitals reviewed.   Constitutional:      General: She is not in acute distress.    Appearance: She is well-developed. She is obese.  HENT:     Head: Normocephalic and atraumatic.     Right Ear: Tympanic membrane normal.     Left Ear: Tympanic membrane normal.  Eyes:     Pupils: Pupils are equal, round, and reactive to light.  Neck:     Thyroid: No thyromegaly.  Cardiovascular:     Rate and Rhythm: Normal rate and regular rhythm.     Heart sounds: Normal heart sounds. No murmur heard. Pulmonary:     Effort: Pulmonary effort is normal. No respiratory distress.     Breath sounds: Normal breath sounds. No wheezing.  Abdominal:     General: Bowel sounds are normal. There is no distension.     Palpations: Abdomen is soft.     Tenderness: There is no abdominal tenderness.  Musculoskeletal:        General: No tenderness. Normal range of motion.     Cervical back: Normal range of motion and neck supple.  Skin:    General: Skin is warm and dry.  Neurological:     Mental Status: She is alert and oriented to person, place, and time.     Cranial Nerves: No cranial nerve deficit.     Deep Tendon Reflexes: Reflexes are normal and symmetric.  Psychiatric:        Behavior: Behavior normal.        Thought Content: Thought content normal.        Judgment: Judgment normal.      BP (!) 179/82   Pulse (!) 57   Temp 98 F (36.7 C) (Oral)   Resp 20   Ht 5' 2"$  (1.575 m)   Wt 211 lb (95.7 kg)   SpO2 96%   BMI 38.59 kg/m      Assessment & Plan:  Jackie Ayala comes in today with chief complaint of Medical Management of Chronic Issues   Diagnosis and orders addressed:  1. Annual physical exam - CMP14+EGFR - CBC with Differential/Platelet - Lipid panel - TSH  2. Essential hypertension - CMP14+EGFR - CBC with Differential/Platelet  3. Gastroesophageal reflux disease, unspecified whether esophagitis present - CMP14+EGFR - CBC with Differential/Platelet  4. Hyperlipidemia, unspecified  hyperlipidemia type - CMP14+EGFR - CBC with Differential/Platelet - Lipid panel  5. Primary osteoarthritis involving multiple joints - CMP14+EGFR - CBC with Differential/Platelet  6. Prediabetes - CMP14+EGFR - CBC with Differential/Platelet - Bayer DCA Hb A1c Waived  7. Morbid obesity ([employer]) - CMP14+EGFR - CBC with Differential/Platelet  8. Acute left-sided low back pain with left-sided sciatica Start diclofenac BID with food for 7-10 days, no other NSAID's  Prednisone  ROM exercises discussed- handout given  Follow up if symptoms worsen or do not improve  - diclofenac (VOLTAREN) 75 MG EC tablet; Take 1 tablet (75 mg total) by mouth 2 (two) times daily.  Dispense: 30 tablet; Refill: 0 - predniSONE (DELTASONE) 20 MG tablet; Take  2 tablets (40 mg total) by mouth daily with breakfast for 5 days.  Dispense: 10 tablet; Refill: 0 - baclofen (LIORESAL) 10 MG tablet; Take 1 tablet (10 mg total) by mouth 3 (three) times daily.  Dispense: 30 each; Refill: 0 - CMP14+EGFR - CBC with Differential/Platelet   Labs pending Continue current medications  Health Maintenance reviewed Diet and exercise encouraged  Follow up plan: 6 months    Evelina Dun, FNP

## 2022-05-08 ENCOUNTER — Other Ambulatory Visit: Payer: Self-pay | Admitting: Family

## 2022-05-08 LAB — CMP14+EGFR
ALT: 21 IU/L (ref 0–32)
AST: 17 IU/L (ref 0–40)
Albumin/Globulin Ratio: 1.4 (ref 1.2–2.2)
Albumin: 4.2 g/dL (ref 3.9–4.9)
Alkaline Phosphatase: 83 IU/L (ref 44–121)
BUN/Creatinine Ratio: 12 (ref 12–28)
BUN: 12 mg/dL (ref 8–27)
Bilirubin Total: 0.4 mg/dL (ref 0.0–1.2)
CO2: 25 mmol/L (ref 20–29)
Calcium: 9.6 mg/dL (ref 8.7–10.3)
Chloride: 103 mmol/L (ref 96–106)
Creatinine, Ser: 1.02 mg/dL — ABNORMAL HIGH (ref 0.57–1.00)
Globulin, Total: 2.9 g/dL (ref 1.5–4.5)
Glucose: 102 mg/dL — ABNORMAL HIGH (ref 70–99)
Potassium: 4.5 mmol/L (ref 3.5–5.2)
Sodium: 140 mmol/L (ref 134–144)
Total Protein: 7.1 g/dL (ref 6.0–8.5)
eGFR: 61 mL/min/{1.73_m2} (ref >59)

## 2022-05-08 LAB — LIPID PANEL
Chol/HDL Ratio: 3.8 ratio (ref 0.0–4.4)
Cholesterol, Total: 185 mg/dL (ref 100–199)
HDL: 49 mg/dL (ref >39)
LDL Chol Calc (NIH): 101 mg/dL — ABNORMAL HIGH (ref 0–99)
Triglycerides: 203 mg/dL — ABNORMAL HIGH (ref 0–149)
VLDL Cholesterol Cal: 35 mg/dL (ref 5–40)

## 2022-05-08 LAB — CBC WITH DIFFERENTIAL/PLATELET
Basophils Absolute: 0 10*3/uL (ref 0.0–0.2)
Basos: 1 %
EOS (ABSOLUTE): 0.1 10*3/uL (ref 0.0–0.4)
Eos: 1 %
Hematocrit: 39.3 % (ref 34.0–46.6)
Hemoglobin: 13.4 g/dL (ref 11.1–15.9)
Immature Grans (Abs): 0 10*3/uL (ref 0.0–0.1)
Immature Granulocytes: 0 %
Lymphocytes Absolute: 2 10*3/uL (ref 0.7–3.1)
Lymphs: 38 %
MCH: 31.5 pg (ref 26.6–33.0)
MCHC: 34.1 g/dL (ref 31.5–35.7)
MCV: 93 fL (ref 79–97)
Monocytes Absolute: 0.3 10*3/uL (ref 0.1–0.9)
Monocytes: 6 %
Neutrophils Absolute: 2.8 10*3/uL (ref 1.4–7.0)
Neutrophils: 54 %
Platelets: 255 10*3/uL (ref 150–450)
RBC: 4.25 x10E6/uL (ref 3.77–5.28)
RDW: 13 % (ref 11.7–15.4)
WBC: 5.2 10*3/uL (ref 3.4–10.8)

## 2022-05-08 LAB — TSH: TSH: 2.47 u[IU]/mL (ref 0.450–4.500)

## 2022-05-12 ENCOUNTER — Encounter: Payer: Self-pay | Admitting: Family Medicine

## 2022-05-14 ENCOUNTER — Encounter: Payer: Self-pay | Admitting: Family

## 2022-05-14 ENCOUNTER — Telehealth: Payer: Self-pay | Admitting: Family

## 2022-05-14 DIAGNOSIS — M5442 Lumbago with sciatica, left side: Secondary | ICD-10-CM

## 2022-05-14 NOTE — Telephone Encounter (Signed)
Referral to PT placed

## 2022-05-14 NOTE — Telephone Encounter (Signed)
Pt called to let Alyse Low know that she is still having problems with her leg. Wants to know what next steps are?

## 2022-05-14 NOTE — Telephone Encounter (Signed)
Patient aware and verbalized understanding. °

## 2022-05-27 ENCOUNTER — Ambulatory Visit: Payer: 59 | Attending: Family | Admitting: Physical Therapy

## 2022-05-27 ENCOUNTER — Encounter: Payer: Self-pay | Admitting: Physical Therapy

## 2022-05-27 DIAGNOSIS — M5442 Lumbago with sciatica, left side: Secondary | ICD-10-CM | POA: Diagnosis not present

## 2022-05-27 DIAGNOSIS — R293 Abnormal posture: Secondary | ICD-10-CM | POA: Diagnosis not present

## 2022-05-27 DIAGNOSIS — M5459 Other low back pain: Secondary | ICD-10-CM | POA: Insufficient documentation

## 2022-05-27 NOTE — Therapy (Signed)
OUTPATIENT PHYSICAL THERAPY THORACOLUMBAR EVALUATION   Patient Name: Jackie Ayala MRN: [id number] DOB:May 20, 1960, 62 y.o., female Today's Date: 05/27/2022  END OF SESSION:  PT End of Session - 05/27/22 1123     Visit Number 1    Number of Visits 4    Date for PT Re-Evaluation 06/24/22    PT Start Time 0900    PT Stop Time 0957    PT Time Calculation (min) 57 min    Activity Tolerance Patient tolerated treatment well    Behavior During Therapy [facility] for tasks assessed/performed             Past Medical History:  Diagnosis Date   Hypertension    Past Surgical History:  Procedure Laterality Date   ABDOMINAL HYSTERECTOMY  2004   ovaries remain   CHOLECYSTECTOMY     Patient Active Problem List   Diagnosis Date Noted   Primary osteoarthritis involving multiple joints 09/04/2021   Right hand weakness 06/19/2021   Hyperlipidemia 03/05/2021   Diabetes mellitus ([place]) 05/13/2020   Morbid obesity ([place]) 08/27/2016   Essential hypertension 06/12/2014   GERD (gastroesophageal reflux disease) 06/12/2014     REFERRING PROVIDER: Evelina Dun  REFERRING DIAG: Acute left-sided LBP with left Sciatica.  Rationale for Evaluation and Treatment: Rehabilitation  THERAPY DIAG:  Other low back pain  Abnormal posture  ONSET DATE: Mid-January, 2024.  SUBJECTIVE:                                                                                                                                                                                           SUBJECTIVE STATEMENT: The patient presents to the clinic with c/o intense left low back and left LE pain that came on in Mid-January of this year.  She does care for her husband which quite possibly contributed to the pain.  She has been using heat pads which helps temporary to decrease her pain.  Walking increases her pain.  Her pain is sever at times.  PERTINENT HISTORY:  H/o lumbar foraminectomy and discectomy on 09/06/20.  PAIN:  Are  you having pain? Yes: NPRS scale: 6/10 Pain location: Left low back and left LE. Pain description: Ache, sore, shooting, sharp, throbbing, numb. Aggravating factors: As above. Relieving factors: As above.  PRECAUTIONS: None  WEIGHT BEARING RESTRICTIONS: No  FALLS:  Has patient fallen in last 6 months? No.  Recommended use of cane.  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  PLOF: Independent with basic ADLs  PATIENT GOALS: Not have  low back and left LE pain.   OBJECTIVE:   POSTURE: flexed trunk  and weight shift right  PALPATION: Very tender to palpation over left low back/SIJ.  LUMBAR ROM:   Patient's pain prohibiting her from accurately assessing active lumbar motion.  LOWER EXTREMITY MMT:    No LE strength deficits noted.  LUMBAR SPECIAL TESTS:  Left LE > 1/4 inch longer than right due to an anterior pelvic rotation.  Leg lengths equal after a left SKTC stretch.  Normal LE DTR's bilaterally.  Transfers:  Sit to stand with armrest very painful.  GAIT: Very antalgic.  Essentially dragging her left LE.  TODAY'S TREATMENT:                                                                                                                              DATE: HMP and IFC at 80-150 Hz on 40% scan x 20 minutes to patient's left low back. Patient tolerated treatment without complaint with normal modality response following removal of modality.   PATIENT EDUCATION:  Education details: As below. Person educated: Patient Education method: Explanation, Demonstration, and Handouts Education comprehension: verbalized understanding and returned demonstration  HOME EXERCISE PROGRAM: HOME EXERCISE PROGRAM Created by [place] Nashon Erbes Mar 6th, 2024 View at [email or web address] using code: [id number]  Page 1 of 1 2 Exercises SINGLE KNEE TO CHEST  STRETCH - SKTC While Lying on your back, hold your knee and gently pull it up towards your chest. Repeat 3 Times Hold 30 Seconds Complete 1 Set Perform 3 Times a Day Pelvic Floor strengthening ISO ball squeeze Bridge:Hip Internal and Leg Rotation In hooklying position, squeeze ball between the knees and hold. While maintaining the squeeze, lift buttocks off of mat as high as you can by contracting glute muscles. While in the bridge position, contract your pelvic muscles by thinking of drawing a string attached to the ball up into the pelvic region. Hold for 3 sec and release. Repeat. Repeat 10 Times Hold 3 Seconds Complete 3 Sets Perform 3 Times a Day  ASSESSMENT:  CLINICAL IMPRESSION: The patient presents to the clinic with c/o left low back and left LE pain that cane on in Mid-January of this year.  Her gait was very antalgic and she was essentially dragging it.  She is very tender to palpation over her left low back/left SIJ region.  Her left LE was > 1/4 inch longer than right due to an anterior pelvic rotation.  She was provided with a stretch to begin performing at home to equalize leg lengths. Patient will benefit from skilled physical therapy intervention to address pain and deficits.  OBJECTIVE IMPAIRMENTS: Abnormal gait, decreased activity tolerance, decreased mobility, difficulty walking, increased muscle spasms, and pain.   ACTIVITY LIMITATIONS: carrying, lifting, bending, standing, transfers, bed mobility, and locomotion level  PARTICIPATION LIMITATIONS: meal prep, cleaning, laundry, and yard work  PERSONAL FACTORS: 1 comorbidity: h/o lumbar surgery  are also affecting patient's functional outcome.  REHAB POTENTIAL: Good  CLINICAL DECISION MAKING: Evolving/moderate complexity  EVALUATION COMPLEXITY: Low   GOALS: **Patient will likely limit visits** SHORT TERM GOALS: Target date: 06/24/22.  Ind with a HEP. Goal status: INITIAL  2.  Eliminate left LE pain.  Goal  status: INITIAL  3.  Normalize gait pattern.  Goal status: INITIAL  PLAN:  PT FREQUENCY: 1-2x/week  PT DURATION: 4 weeks  PLANNED INTERVENTIONS: Therapeutic exercises, Therapeutic activity, Gait training, Patient/Family education, Self Care, Dry Needling, Cryotherapy, Moist heat, Ultrasound, and Manual therapy.  PLAN FOR NEXT SESSION: Modalities and STW/M to left SIJ region.  Adductor squeezes and left SKTC.  Hip bridges.   Eldoris Beiser, [place], PT 05/27/2022, 11:25 AM

## 2022-06-01 ENCOUNTER — Ambulatory Visit: Payer: Self-pay | Admitting: *Deleted

## 2022-06-01 ENCOUNTER — Encounter: Payer: Self-pay | Admitting: Physical Therapy

## 2022-06-01 ENCOUNTER — Ambulatory Visit: Payer: 59 | Admitting: Physical Therapy

## 2022-06-01 DIAGNOSIS — M5442 Lumbago with sciatica, left side: Secondary | ICD-10-CM | POA: Diagnosis not present

## 2022-06-01 DIAGNOSIS — M5459 Other low back pain: Secondary | ICD-10-CM | POA: Diagnosis not present

## 2022-06-01 DIAGNOSIS — R293 Abnormal posture: Secondary | ICD-10-CM | POA: Diagnosis not present

## 2022-06-01 NOTE — Therapy (Addendum)
OUTPATIENT PHYSICAL THERAPY THORACOLUMBAR TREATMENT  Patient Name: Jackie Ayala MRN: [id number] DOB:1961/02/28, 62 y.o., female Today's Date: 06/01/2022  END OF SESSION:  PT End of Session - 06/01/22 0940     Visit Number 2    Number of Visits 4    Date for PT Re-Evaluation 06/24/22    Authorization Type FOTO AT LEAST EVERY 5TH VISIT.  PROGRESS NOTE AT 10TH VISIT.  KX MODIFIER AFTER 15 VISITS.    PT Start Time 0900    PT Stop Time 0957    PT Time Calculation (min) 57 min    Activity Tolerance Patient tolerated treatment well    Behavior During Therapy [facility] for tasks assessed/performed             Past Medical History:  Diagnosis Date   Hypertension    Past Surgical History:  Procedure Laterality Date   ABDOMINAL HYSTERECTOMY  2004   ovaries remain   CHOLECYSTECTOMY     Patient Active Problem List   Diagnosis Date Noted   Primary osteoarthritis involving multiple joints 09/04/2021   Right hand weakness 06/19/2021   Hyperlipidemia 03/05/2021   Diabetes mellitus ([place]) 05/13/2020   Morbid obesity ([place]) 08/27/2016   Essential hypertension 06/12/2014   GERD (gastroesophageal reflux disease) 06/12/2014     REFERRING PROVIDER: Evelina Dun  REFERRING DIAG: Acute left-sided LBP with left Sciatica.  Rationale for Evaluation and Treatment: Rehabilitation  THERAPY DIAG:  Other low back pain  ONSET DATE: Mid-January, 2024.  SUBJECTIVE:                                                                                                                                                                                           SUBJECTIVE STATEMENT: A lot of pain over weekend.  Today, a 9. PERTINENT HISTORY:  H/o lumbar foraminectomy and discectomy on 09/06/20.  PAIN:  Are you having pain? Yes: NPRS scale: 9/10 Pain location: Left low back and left LE. Pain description: Ache, sore, shooting, sharp, throbbing, numb. Aggravating factors: As above. Relieving factors: As  above.  PRECAUTIONS: None  WEIGHT BEARING RESTRICTIONS: No  FALLS:  Has patient fallen in last 6 months? No.  Recommended use of cane.  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  PLOF: Independent with basic ADLs  PATIENT GOALS: Not have low back and left LE pain.   OBJECTIVE:   POSTURE: flexed trunk  and weight shift right  PALPATION: Very tender to palpation over left low back/SIJ.  LUMBAR ROM:   Patient's pain  prohibiting her from accurately assessing active lumbar motion.  LOWER EXTREMITY MMT:    No LE strength deficits noted.  LUMBAR SPECIAL TESTS:  Left LE > 1/4 inch longer than right due to an anterior pelvic rotation.  Leg lengths equal after a left SKTC stretch.  Normal LE DTR's bilaterally.  Transfers:  Sit to stand with armrest very painful.  GAIT: Very antalgic.  Essentially dragging her left LE.  TODAY'S TREATMENT:                                                                                                                              DATE: Right sdly position with pillow between knees for comfort f/b Combo e'stim/US at 1.50 W/CM2 x 12 minutes to patient's right upper gluteal and SIJ region f/b STW/M x 12 minutes including ischemic release technique f/b HMP and IFC at 80-150 Hz on 40% scan x 20 minutes to patient's left low back. Patient tolerated treatment without complaint with normal modality response following removal of modality.   PATIENT EDUCATION:  Education details: As below. Person educated: Patient Education method: Explanation, Demonstration, and Handouts Education comprehension: verbalized understanding and returned demonstration  HOME EXERCISE PROGRAM: HOME EXERCISE PROGRAM Created by [place] Orissa Arreaga Mar 6th, 2024 View at [email or web address] using code: [id number]  Page 1 of 1 2 Exercises SINGLE  KNEE TO CHEST STRETCH - SKTC While Lying on your back, hold your knee and gently pull it up towards your chest. Repeat 3 Times Hold 30 Seconds Complete 1 Set Perform 3 Times a Day Pelvic Floor strengthening ISO ball squeeze Bridge:Hip Internal and Leg Rotation In hooklying position, squeeze ball between the knees and hold. While maintaining the squeeze, lift buttocks off of mat as high as you can by contracting glute muscles. While in the bridge position, contract your pelvic muscles by thinking of drawing a string attached to the ball up into the pelvic region. Hold for 3 sec and release. Repeat. Repeat 10 Times Hold 3 Seconds Complete 3 Sets Perform 3 Times a Day  ASSESSMENT:  CLINICAL IMPRESSION: Patient very tender to palpation over left upper gluteal musculature.  Good response to treatment and she felt better after treatment.  She is considering dry needling.  Consent provided for patient.  OBJECTIVE IMPAIRMENTS: Abnormal gait, decreased activity tolerance, decreased mobility, difficulty walking, increased muscle spasms, and pain.   ACTIVITY LIMITATIONS: carrying, lifting, bending, standing, transfers, bed mobility, and locomotion level  PARTICIPATION LIMITATIONS: meal prep, cleaning, laundry, and yard work  PERSONAL FACTORS: 1 comorbidity: h/o lumbar surgery  are also affecting patient's functional outcome.   REHAB POTENTIAL: Good  CLINICAL DECISION MAKING: Evolving/moderate complexity  EVALUATION COMPLEXITY: Low   GOALS: **Patient will likely limit visits** SHORT TERM GOALS: Target date: 06/24/22.  Ind with a HEP. Goal status: INITIAL  2.  Eliminate left LE pain.  Goal status: INITIAL  3.  Normalize gait pattern.  Goal status: INITIAL  PLAN:  PT FREQUENCY: 1-2x/week  PT DURATION: 4 weeks  PLANNED INTERVENTIONS: Therapeutic exercises, Therapeutic activity, Gait training, Patient/Family education, Self Care, Dry Needling, Cryotherapy, Moist heat, Ultrasound,  and Manual therapy.  PLAN FOR NEXT SESSION: Modalities and STW/M to left SIJ region.  Adductor squeezes and left SKTC.  Hip bridges.   Hatsuko Bizzarro, [place], PT 06/01/2022, 10:11 AM

## 2022-06-01 NOTE — Patient Instructions (Signed)
Visit Information  Thank you for taking time to visit with me today. Please don't hesitate to contact me if I can be of assistance to you.   Following are the goals we discussed today:   Goals Addressed             This Visit's Progress    COMPLETED: mychart reset       Interventions Today    Flowsheet Row Most Recent Value  Chronic Disease   Chronic disease during today's visit Other  [assisted to reset mychart password]  Education Interventions   Education Provided Provided Education  Provided Verbal Education On --  [assisted to reset mychart password]              Our next appointment is  N/A  on N/A at N/A  Please call the care guide team at [phone] if you need to cancel or reschedule your appointment.   If you are experiencing a Mental Health or [place] or need someone to talk to, please call the Suicide and Crisis Lifeline: 988 call the [place] National Suicide Prevention Lifeline: 947-347-5292 or TTY: 6175111641 TTY (603) 541-9181) to talk to a trained counselor call 1-800-273-TALK (toll free, 24 hour hotline) go to [facility] [address], [place] [phone]) call 911   Patient verbalizes understanding of instructions and care plan provided today and agrees to view in [place]. Active MyChart status and patient understanding of how to access instructions and care plan via MyChart confirmed with patient.     The patient has been provided with contact information for the care management team and has been advised to call with any health related questions or concerns.   Bowie Doiron L. Lavina Hamman, RN, BSN, [place] Coordinator Office number [phone]

## 2022-06-01 NOTE — Patient Outreach (Signed)
  Care Coordination   My chart assist  Visit Note   06/01/2022 Name: Shellie Rogoff MRN: [id number] DOB: November 20, 1960  Natajah Derderian is a 62 y.o. year old female who sees [place], Theador Hawthorne, FNP for primary care. I spoke with  Julieanne Cotton by phone today.  What matters to the patients health and wellness today?  assisted to reset mychart password     Goals Addressed             This Visit's Progress    COMPLETED: mychart reset       Interventions Today    Flowsheet Row Most Recent Value  Chronic Disease   Chronic disease during today's visit Other  [assisted to reset mychart password]  Education Interventions   Education Provided Provided Education  Provided Verbal Education On --  [assisted to reset mychart password]              SDOH assessments and interventions completed:  No     Care Coordination Interventions:  Yes, provided   Follow up plan: No further intervention required.   Encounter Outcome:  Pt. Visit Completed   Stacye Noori L. Lavina Hamman, RN, BSN, [place] Coordinator Office number [phone]

## 2022-06-04 ENCOUNTER — Ambulatory Visit: Payer: 59 | Admitting: Pharmacist

## 2022-06-05 ENCOUNTER — Ambulatory Visit: Payer: 59 | Admitting: Physical Therapy

## 2022-06-05 DIAGNOSIS — R293 Abnormal posture: Secondary | ICD-10-CM | POA: Diagnosis not present

## 2022-06-05 DIAGNOSIS — M5459 Other low back pain: Secondary | ICD-10-CM

## 2022-06-05 DIAGNOSIS — M5442 Lumbago with sciatica, left side: Secondary | ICD-10-CM | POA: Diagnosis not present

## 2022-06-05 NOTE — Therapy (Signed)
OUTPATIENT PHYSICAL THERAPY THORACOLUMBAR TREATMENT  Patient Name: Jackie Ayala MRN: [id number] DOB:15-Feb-1961, 62 y.o., female Today's Date: 06/05/2022  END OF SESSION:  PT End of Session - 06/05/22 1053     Visit Number 3    Number of Visits 4    Date for PT Re-Evaluation 06/24/22    Authorization Type FOTO AT LEAST EVERY 5TH VISIT.  PROGRESS NOTE AT 10TH VISIT.  KX MODIFIER AFTER 15 VISITS.             Past Medical History:  Diagnosis Date   Hypertension    Past Surgical History:  Procedure Laterality Date   ABDOMINAL HYSTERECTOMY  2004   ovaries remain   CHOLECYSTECTOMY     Patient Active Problem List   Diagnosis Date Noted   Primary osteoarthritis involving multiple joints 09/04/2021   Right hand weakness 06/19/2021   Hyperlipidemia 03/05/2021   Diabetes mellitus ([place]) 05/13/2020   Morbid obesity ([place]) 08/27/2016   Essential hypertension 06/12/2014   GERD (gastroesophageal reflux disease) 06/12/2014     REFERRING PROVIDER: Evelina Dun  REFERRING DIAG: Acute left-sided LBP with left Sciatica.  Rationale for Evaluation and Treatment: Rehabilitation  THERAPY DIAG:  Other low back pain  ONSET DATE: Mid-January, 2024.  SUBJECTIVE:                                                                                                                                                                                           SUBJECTIVE STATEMENT:  Pain really bad yesterday.  Not as bad today.  CC is left anterior thigh pain/symptoms today.  PERTINENT HISTORY:  H/o lumbar foraminectomy and discectomy on 09/06/20.  PAIN:  Are you having pain? Yes: NPRS scale:  /10 Pain location: Left low back and left LE. Pain description: Ache, sore, shooting, sharp, throbbing, numb. Aggravating factors: As above. Relieving factors: As above.  PRECAUTIONS: None  WEIGHT BEARING RESTRICTIONS: No  FALLS:  Has patient fallen in last 6 months? No.  Recommended use of  cane.  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  PLOF: Independent with basic ADLs  PATIENT GOALS: Not have low back and left LE pain.   OBJECTIVE:   POSTURE: flexed trunk  and weight shift right  PALPATION: Very tender to palpation over left low back/SIJ.  LUMBAR ROM:   Patient's pain prohibiting her from accurately assessing active lumbar motion.  LOWER EXTREMITY MMT:    No LE strength deficits noted.  LUMBAR SPECIAL TESTS:  Left LE > 1/4 inch longer than  right due to an anterior pelvic rotation.  Leg lengths equal after a left SKTC stretch.  Normal LE DTR's bilaterally.  Transfers:  Sit to stand with armrest very painful.  GAIT: Very antalgic.  Essentially dragging her left LE.  TODAY'S TREATMENT:                                                                                                                              DATE: Nustep level 3 x 15 minutes f/b 1-1 stretching x 8 minutes into bilateral SKTC, DKTC, gentle trunk rotation and HSS and gentle left femoral traction with oscillations f/b   HMP and IFC at 80-150 Hz on 40% scan x 20 minutes to patient's left low back. Patient tolerated treatment without complaint with normal modality response following removal of modality.   PATIENT EDUCATION:  Education details: As below. Person educated: Patient Education method: Explanation, Demonstration, and Handouts Education comprehension: verbalized understanding and returned demonstration  HOME EXERCISE PROGRAM: HOME EXERCISE PROGRAM Created by [place] Lukas Pelcher Mar 6th, 2024 View at [email or web address] using code: [id number]  Page 1 of 1 2 Exercises SINGLE KNEE TO CHEST STRETCH - SKTC While Lying on your back, hold your knee and gently pull it up towards your chest. Repeat 3 Times Hold 30 Seconds Complete 1 Set Perform 3 Times a  Day Pelvic Floor strengthening ISO ball squeeze Bridge:Hip Internal and Leg Rotation In hooklying position, squeeze ball between the knees and hold. While maintaining the squeeze, lift buttocks off of mat as high as you can by contracting glute muscles. While in the bridge position, contract your pelvic muscles by thinking of drawing a string attached to the ball up into the pelvic region. Hold for 3 sec and release. Repeat. Repeat 10 Times Hold 3 Seconds Complete 3 Sets Perform 3 Times a Day  ASSESSMENT:  CLINICAL IMPRESSION: Low back pain better today.  CC is left anterior thigh pain and symptoms.  She is compliant to her HEP. Patient tolerated treatment without complaint today.  OBJECTIVE IMPAIRMENTS: Abnormal gait, decreased activity tolerance, decreased mobility, difficulty walking, increased muscle spasms, and pain.   ACTIVITY LIMITATIONS: carrying, lifting, bending, standing, transfers, bed mobility, and locomotion level  PARTICIPATION LIMITATIONS: meal prep, cleaning, laundry, and yard work  PERSONAL FACTORS: 1 comorbidity: h/o lumbar surgery  are also affecting patient's functional outcome.   REHAB POTENTIAL: Good  CLINICAL DECISION MAKING: Evolving/moderate complexity  EVALUATION COMPLEXITY: Low   GOALS: **Patient will likely limit visits** SHORT TERM GOALS: Target date: 06/24/22.  Ind with a HEP. Goal status: INITIAL  2.  Eliminate left LE pain.  Goal status: INITIAL  3.  Normalize gait pattern.  Goal status: INITIAL  PLAN:  PT FREQUENCY: 1-2x/week  PT DURATION: 4 weeks  PLANNED INTERVENTIONS: Therapeutic exercises, Therapeutic activity, Gait training, Patient/Family education, Self Care, Dry Needling, Cryotherapy, Moist heat, Ultrasound, and Manual therapy.  PLAN FOR NEXT SESSION: Modalities and STW/M to left SIJ region.  Adductor squeezes and  left SKTC.  Hip bridges.   Gared Gillie, [place], PT 06/05/2022, 11:02 AM

## 2022-06-11 ENCOUNTER — Encounter: Payer: 59 | Admitting: Physical Therapy

## 2022-06-11 NOTE — Progress Notes (Signed)
    06/04/2022 Name: Jackie Ayala MRN: [id number] DOB: 04-27-1960   Patient to reschedule for PharmD visit.  Will route to [employer] for rescheduling.    Regina Eck, PharmD, [place] Clinical Pharmacist, [facility]

## 2022-06-15 ENCOUNTER — Ambulatory Visit: Payer: 59 | Admitting: Physical Therapy

## 2022-06-15 ENCOUNTER — Other Ambulatory Visit: Payer: Self-pay | Admitting: Family

## 2022-06-15 ENCOUNTER — Telehealth: Payer: Self-pay | Admitting: Family

## 2022-06-15 DIAGNOSIS — M5442 Lumbago with sciatica, left side: Secondary | ICD-10-CM | POA: Diagnosis not present

## 2022-06-15 DIAGNOSIS — M5459 Other low back pain: Secondary | ICD-10-CM | POA: Diagnosis not present

## 2022-06-15 DIAGNOSIS — R293 Abnormal posture: Secondary | ICD-10-CM | POA: Diagnosis not present

## 2022-06-15 NOTE — Telephone Encounter (Signed)
REFERRAL REQUEST Telephone Note  Have you been seen at our office for this problem? YES (Advise that they may need an appointment with their PCP before a referral can be done)  Reason for Referral: Sciatic nerve on left side Referral discussed with patient: YES  Best contact number of patient for referral team: [phone]    Has patient been seen by a specialist for this issue before: YES  Patient provider preference for referral: Dr. Britt Bottom Patient location preference for referral: [place]   Patient notified that referrals can take up to a week or longer to process. If they haven't heard anything within a week they should call back and speak with the referral department.

## 2022-06-15 NOTE — Therapy (Signed)
OUTPATIENT PHYSICAL THERAPY THORACOLUMBAR TREATMENT  Patient Name: Jackie Ayala MRN: [id number] DOB:04/16/1960, 62 y.o., female Today's Date: 06/15/2022  END OF SESSION:  PT End of Session - 06/15/22 1022     Visit Number 4    Number of Visits 4    Date for PT Re-Evaluation 06/24/22    PT Start Time 0945    PT Stop Time [id number]    PT Time Calculation (min) 58 min    Activity Tolerance Patient tolerated treatment well    Behavior During Therapy [facility] for tasks assessed/performed             Past Medical History:  Diagnosis Date   Hypertension    Past Surgical History:  Procedure Laterality Date   ABDOMINAL HYSTERECTOMY  2004   ovaries remain   CHOLECYSTECTOMY     Patient Active Problem List   Diagnosis Date Noted   Primary osteoarthritis involving multiple joints 09/04/2021   Right hand weakness 06/19/2021   Hyperlipidemia 03/05/2021   Diabetes mellitus ([place]) 05/13/2020   Morbid obesity ([place]) 08/27/2016   Essential hypertension 06/12/2014   GERD (gastroesophageal reflux disease) 06/12/2014     REFERRING PROVIDER: Evelina Dun  REFERRING DIAG: Acute left-sided LBP with left Sciatica.  Rationale for Evaluation and Treatment: Rehabilitation  THERAPY DIAG:  Other low back pain  ONSET DATE: Mid-January, 2024.  SUBJECTIVE:                                                                                                                                                                                           SUBJECTIVE STATEMENT:  The patient presents to the clinic with c/o severe left thigh pain.  She uses a cane for safety as the leg gives way.  PERTINENT HISTORY:  H/o lumbar foraminectomy and discectomy on 09/06/20.  PAIN:  Are you having pain? 10/10.  Left thigh.  PRECAUTIONS: None  WEIGHT BEARING RESTRICTIONS: No  FALLS:  Has patient fallen in last 6 months? No.  Recommended use of cane.  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  PLOF: Independent with basic ADLs  PATIENT GOALS: Not have low back and left LE pain.   OBJECTIVE:   POSTURE: flexed trunk  and weight shift right  PALPATION: Very tender to palpation over left low back/SIJ.  LUMBAR ROM:   Patient's pain prohibiting her from accurately assessing active lumbar motion.  LOWER EXTREMITY MMT:    No LE strength deficits noted.  LUMBAR SPECIAL TESTS:  Left LE > 1/4 inch longer  than right due to an anterior pelvic rotation.  Leg lengths equal after a left SKTC stretch.  Normal LE DTR's bilaterally.  Transfers:  Sit to stand with armrest very painful.  GAIT: Very antalgic.  Essentially dragging her left LE.  TODAY'S TREATMENT:                                                                                                                              DATE: Nustep level 3 x 13 minutes f/b positional traction x 10 minutes f/b  HMP and IFC at 80-150 Hz on 40% scan x 20 minutes to patient's left hip region. Patient tolerated treatment without complaint with normal modality response following removal of modality.   PATIENT EDUCATION:  Education details: As below. Person educated: Patient Education method: Explanation, Demonstration, and Handouts Education comprehension: verbalized understanding and returned demonstration  HOME EXERCISE PROGRAM: HOME EXERCISE PROGRAM Created by [place] Savon Cobbs Mar 6th, 2024 View at [email or web address] using code: [id number]  Page 1 of 1 2 Exercises SINGLE KNEE TO CHEST STRETCH - SKTC While Lying on your back, hold your knee and gently pull it up towards your chest. Repeat 3 Times Hold 30 Seconds Complete 1 Set Perform 3 Times a Day Pelvic Floor strengthening ISO ball squeeze Bridge:Hip Internal and Leg Rotation In hooklying position, squeeze ball between the knees and hold. While maintaining the  squeeze, lift buttocks off of mat as high as you can by contracting glute muscles. While in the bridge position, contract your pelvic muscles by thinking of drawing a string attached to the ball up into the pelvic region. Hold for 3 sec and release. Repeat. Repeat 10 Times Hold 3 Seconds Complete 3 Sets Perform 3 Times a Day  ASSESSMENT:  CLINICAL IMPRESSION: The patient has completed 4 of 4 scheduled visits.  She has severe left thigh pain.  We discussed having the patient see an Orthopedist.  She has seen Dr. Melina Schools in the past who did her lumbar surgery.    OBJECTIVE IMPAIRMENTS: Abnormal gait, decreased activity tolerance, decreased mobility, difficulty walking, increased muscle spasms, and pain.   ACTIVITY LIMITATIONS: carrying, lifting, bending, standing, transfers, bed mobility, and locomotion level  PARTICIPATION LIMITATIONS: meal prep, cleaning, laundry, and yard work  PERSONAL FACTORS: 1 comorbidity: h/o lumbar surgery  are also affecting patient's functional outcome.   REHAB POTENTIAL: Good  CLINICAL DECISION MAKING: Evolving/moderate complexity  EVALUATION COMPLEXITY: Low   GOALS: **Patient will likely limit visits** SHORT TERM GOALS: Target date: 06/24/22.  Ind with a HEP. Goal status: INITIAL  2.  Eliminate left LE pain.  Goal status: INITIAL  3.  Normalize gait pattern.  Goal status: INITIAL  PLAN:  PT FREQUENCY: 1-2x/week  PT DURATION: 4 weeks  PLANNED INTERVENTIONS: Therapeutic exercises, Therapeutic activity, Gait training, Patient/Family education, Self Care, Dry Needling, Cryotherapy, Moist heat, Ultrasound, and Manual therapy.  PLAN FOR NEXT SESSION: Modalities and STW/M to left SIJ region.  Adductor squeezes and left  SKTC.  Hip bridges.   Tejay Hubert, [place], PT 06/15/2022, 10:53 AM

## 2022-06-15 NOTE — Progress Notes (Signed)
Referral placed.

## 2022-06-29 DIAGNOSIS — M5432 Sciatica, left side: Secondary | ICD-10-CM | POA: Diagnosis not present

## 2022-06-29 DIAGNOSIS — M9903 Segmental and somatic dysfunction of lumbar region: Secondary | ICD-10-CM | POA: Diagnosis not present

## 2022-06-30 DIAGNOSIS — M5432 Sciatica, left side: Secondary | ICD-10-CM | POA: Diagnosis not present

## 2022-06-30 DIAGNOSIS — M9903 Segmental and somatic dysfunction of lumbar region: Secondary | ICD-10-CM | POA: Diagnosis not present

## 2022-07-02 DIAGNOSIS — M5432 Sciatica, left side: Secondary | ICD-10-CM | POA: Diagnosis not present

## 2022-07-02 DIAGNOSIS — M9903 Segmental and somatic dysfunction of lumbar region: Secondary | ICD-10-CM | POA: Diagnosis not present

## 2022-07-07 DIAGNOSIS — M9903 Segmental and somatic dysfunction of lumbar region: Secondary | ICD-10-CM | POA: Diagnosis not present

## 2022-07-07 DIAGNOSIS — M5432 Sciatica, left side: Secondary | ICD-10-CM | POA: Diagnosis not present

## 2022-07-08 DIAGNOSIS — M5432 Sciatica, left side: Secondary | ICD-10-CM | POA: Diagnosis not present

## 2022-07-08 DIAGNOSIS — M9903 Segmental and somatic dysfunction of lumbar region: Secondary | ICD-10-CM | POA: Diagnosis not present

## 2022-07-09 DIAGNOSIS — M9903 Segmental and somatic dysfunction of lumbar region: Secondary | ICD-10-CM | POA: Diagnosis not present

## 2022-07-09 DIAGNOSIS — M5432 Sciatica, left side: Secondary | ICD-10-CM | POA: Diagnosis not present

## 2022-07-10 ENCOUNTER — Telehealth: Payer: 59

## 2022-07-14 DIAGNOSIS — M9903 Segmental and somatic dysfunction of lumbar region: Secondary | ICD-10-CM | POA: Diagnosis not present

## 2022-07-14 DIAGNOSIS — M5432 Sciatica, left side: Secondary | ICD-10-CM | POA: Diagnosis not present

## 2022-07-16 DIAGNOSIS — M9903 Segmental and somatic dysfunction of lumbar region: Secondary | ICD-10-CM | POA: Diagnosis not present

## 2022-07-16 DIAGNOSIS — M5432 Sciatica, left side: Secondary | ICD-10-CM | POA: Diagnosis not present

## 2022-07-21 DIAGNOSIS — M5432 Sciatica, left side: Secondary | ICD-10-CM | POA: Diagnosis not present

## 2022-07-21 DIAGNOSIS — M9903 Segmental and somatic dysfunction of lumbar region: Secondary | ICD-10-CM | POA: Diagnosis not present

## 2022-07-23 ENCOUNTER — Ambulatory Visit (INDEPENDENT_AMBULATORY_CARE_PROVIDER_SITE_OTHER): Payer: 59 | Admitting: Pharmacist

## 2022-07-23 DIAGNOSIS — E119 Type 2 diabetes mellitus without complications: Secondary | ICD-10-CM

## 2022-07-23 DIAGNOSIS — M5432 Sciatica, left side: Secondary | ICD-10-CM | POA: Diagnosis not present

## 2022-07-23 DIAGNOSIS — M9903 Segmental and somatic dysfunction of lumbar region: Secondary | ICD-10-CM | POA: Diagnosis not present

## 2022-07-28 DIAGNOSIS — M9903 Segmental and somatic dysfunction of lumbar region: Secondary | ICD-10-CM | POA: Diagnosis not present

## 2022-07-28 DIAGNOSIS — M5432 Sciatica, left side: Secondary | ICD-10-CM | POA: Diagnosis not present

## 2022-07-30 DIAGNOSIS — M5432 Sciatica, left side: Secondary | ICD-10-CM | POA: Diagnosis not present

## 2022-07-30 DIAGNOSIS — M9903 Segmental and somatic dysfunction of lumbar region: Secondary | ICD-10-CM | POA: Diagnosis not present

## 2022-08-04 DIAGNOSIS — M9903 Segmental and somatic dysfunction of lumbar region: Secondary | ICD-10-CM | POA: Diagnosis not present

## 2022-08-04 DIAGNOSIS — M5432 Sciatica, left side: Secondary | ICD-10-CM | POA: Diagnosis not present

## 2022-08-06 DIAGNOSIS — M5432 Sciatica, left side: Secondary | ICD-10-CM | POA: Diagnosis not present

## 2022-08-06 DIAGNOSIS — M9903 Segmental and somatic dysfunction of lumbar region: Secondary | ICD-10-CM | POA: Diagnosis not present

## 2022-08-11 DIAGNOSIS — M5432 Sciatica, left side: Secondary | ICD-10-CM | POA: Diagnosis not present

## 2022-08-11 DIAGNOSIS — M9903 Segmental and somatic dysfunction of lumbar region: Secondary | ICD-10-CM | POA: Diagnosis not present

## 2022-08-18 DIAGNOSIS — M9903 Segmental and somatic dysfunction of lumbar region: Secondary | ICD-10-CM | POA: Diagnosis not present

## 2022-08-18 DIAGNOSIS — M5432 Sciatica, left side: Secondary | ICD-10-CM | POA: Diagnosis not present

## 2022-08-20 ENCOUNTER — Encounter: Payer: Self-pay | Admitting: Pharmacist

## 2022-08-20 NOTE — Progress Notes (Signed)
    07/23/2022 Name: Jackie Ayala MRN: [id number] DOB: Dec 12, 1960   S:  62 yoF Presents for diabetes evaluation, education, and management Patient was referred and last seen by Primary Care Provider on 05/07/22. Patient reports Diabetes was diagnosed in 04/2022.  This is a new type 2 diabetes diagnosis for this patient.  Insurance coverage/medication affordability: a[employer]  Patient reports adherence with medications. Current diabetes medications include: n/a new diagnosis Current hypertension medications include: ace Goal 130/80 Current hyperlipidemia medications include: statin   Patient denies hypoglycemic events.   Patient reported dietary habits: Eats 3 meals/day Discussed meal planning options and Plate method for healthy eating Avoid sugary drinks and desserts Incorporate balanced protein, non starchy veggies, 1 serving of carbohydrate with each meal Increase water intake Increase physical activity as able  Patient-reported exercise habits: encouraged as able  O:  Lab Results  Component Value Date   HGBA1C 6.5 (H) 05/07/2022     Lipid Panel     Component Value Date/Time   CHOL 185 05/07/2022 1149   TRIG 203 (H) 05/07/2022 1149   TRIG 116 06/16/2013 1729   HDL 49 05/07/2022 1149   HDL 49 06/16/2013 1729   CHOLHDL 3.8 05/07/2022 1149   LDLCALC 101 (H) 05/07/2022 1149   LDLCALC 94 06/16/2013 1729     Home fasting blood sugars: n/a  2 hour post-meal/random blood sugars: n/a.    Clinical Atherosclerotic Cardiovascular Disease (ASCVD): No   The 10-year ASCVD risk score (Arnett DK, et al., 2019) is: 14.2%   Values used to calculate the score:     Age: 62 years     Sex: Female     Is Non-Hispanic African American: No     Diabetic: Yes     Tobacco smoker: No     Systolic Blood Pressure: 136 mmHg     Is BP treated: Yes     HDL Cholesterol: 49 mg/dL     Total Cholesterol: 185 mg/dL    A/P:  Diabetes W0JW new onset.  Reviewed dietary/lifestyle  modifications with patient.  Educational materials mailed to patient as well.  No medications to be added for T2DM at this time.  Patient is currently on ACEi/statin.   If interested in testing blood sugar (BG goals mailed to patient FBG<130, PPBG<180)--relion meter at [employer] is the best deal  -Extensively discussed pathophysiology of diabetes, recommended lifestyle interventions, dietary effects on blood sugar control  -Counseled on s/sx of and management of hypoglycemia  -Next A1C anticipated 6 months.   Written patient instructions provided.  Total time in counseling 20 minutes.   Follow up PCP Clinic Visit in 6 months  Kieth Brightly, PharmD, BCACP Clinical Pharmacist, [facility]

## 2022-09-03 DIAGNOSIS — M9903 Segmental and somatic dysfunction of lumbar region: Secondary | ICD-10-CM | POA: Diagnosis not present

## 2022-09-03 DIAGNOSIS — M5432 Sciatica, left side: Secondary | ICD-10-CM | POA: Diagnosis not present

## 2022-09-08 DIAGNOSIS — M5432 Sciatica, left side: Secondary | ICD-10-CM | POA: Diagnosis not present

## 2022-09-08 DIAGNOSIS — M9903 Segmental and somatic dysfunction of lumbar region: Secondary | ICD-10-CM | POA: Diagnosis not present

## 2022-09-15 DIAGNOSIS — M5432 Sciatica, left side: Secondary | ICD-10-CM | POA: Diagnosis not present

## 2022-09-15 DIAGNOSIS — M9903 Segmental and somatic dysfunction of lumbar region: Secondary | ICD-10-CM | POA: Diagnosis not present

## 2022-09-22 DIAGNOSIS — M9903 Segmental and somatic dysfunction of lumbar region: Secondary | ICD-10-CM | POA: Diagnosis not present

## 2022-09-22 DIAGNOSIS — M5432 Sciatica, left side: Secondary | ICD-10-CM | POA: Diagnosis not present

## 2022-10-08 DIAGNOSIS — M9903 Segmental and somatic dysfunction of lumbar region: Secondary | ICD-10-CM | POA: Diagnosis not present

## 2022-10-08 DIAGNOSIS — M5432 Sciatica, left side: Secondary | ICD-10-CM | POA: Diagnosis not present

## 2022-10-22 DIAGNOSIS — M9903 Segmental and somatic dysfunction of lumbar region: Secondary | ICD-10-CM | POA: Diagnosis not present

## 2022-10-22 DIAGNOSIS — M5432 Sciatica, left side: Secondary | ICD-10-CM | POA: Diagnosis not present

## 2022-10-27 ENCOUNTER — Other Ambulatory Visit: Payer: Self-pay | Admitting: Family

## 2022-10-27 DIAGNOSIS — I10 Essential (primary) hypertension: Secondary | ICD-10-CM

## 2022-11-04 ENCOUNTER — Encounter: Payer: Self-pay | Admitting: *Deleted

## 2022-11-04 ENCOUNTER — Telehealth: Payer: Self-pay | Admitting: *Deleted

## 2022-11-04 NOTE — Telephone Encounter (Signed)
I attempted to contact patient by telephone but was unsuccessful. According to the patient's chart they are due for follow up  with [facility]. I have left a HIPAA compliant message advising the patient to contact [facility]  at [phone]. I will continue to follow up with the patient to make sure this appointment is scheduled.

## 2022-11-05 DIAGNOSIS — M9903 Segmental and somatic dysfunction of lumbar region: Secondary | ICD-10-CM | POA: Diagnosis not present

## 2022-11-05 DIAGNOSIS — M5432 Sciatica, left side: Secondary | ICD-10-CM | POA: Diagnosis not present

## 2022-11-19 ENCOUNTER — Encounter: Payer: Self-pay | Admitting: Family

## 2022-11-19 ENCOUNTER — Other Ambulatory Visit: Payer: Self-pay | Admitting: Family

## 2022-11-19 DIAGNOSIS — M5432 Sciatica, left side: Secondary | ICD-10-CM | POA: Diagnosis not present

## 2022-11-19 DIAGNOSIS — I10 Essential (primary) hypertension: Secondary | ICD-10-CM

## 2022-11-19 DIAGNOSIS — M9903 Segmental and somatic dysfunction of lumbar region: Secondary | ICD-10-CM | POA: Diagnosis not present

## 2022-11-19 NOTE — Telephone Encounter (Signed)
[facility] pt NTBS 30-d given 10/27/22

## 2022-11-19 NOTE — Telephone Encounter (Signed)
Left message for pt to call back and schedule appt. I will mail letter

## 2022-11-23 ENCOUNTER — Other Ambulatory Visit: Payer: Self-pay | Admitting: Family

## 2022-11-23 DIAGNOSIS — I10 Essential (primary) hypertension: Secondary | ICD-10-CM

## 2022-12-01 NOTE — Patient Instructions (Addendum)
Our records indicate that you are due for your annual mammogram/breast imaging. While there is no way to prevent breast cancer, early detection provides the best opportunity for curing it. For women over the age of 26, the American Cancer Society recommends a yearly clinical breast exam and a yearly mammogram. These practices have saved thousands of lives. We need your help to ensure that you are receiving optimal medical care. Please call the imaging location that has done you previous mammograms. Please remember to list [place] as your primary care. This helps make sure we receive a report and can update your chart.  Below is the contact information for several local breast imaging centers. You may call the location that works best for you, and they will be happy to assistance in making you an appointment. You do not need an order for a regular screening mammogram. However, if you are having any problems or concerns with you breast area, please let your primary care provider know, and appropriate orders will be placed. Please let our office know if you have any questions or concerns. Or if you need information for another imaging center not on this list or outside of the area. We are commented to working with you on your health care journey.   The mobile unit/bus ([facility]) - they come twice a month to our location.  These appointments can be made through our office or by call [facility]  [facility]  [address] [place], [place] [address] Phone [phone]  [facility] Radiology Department  [address] [place], [place] [address] [phone]  [facility] ([facility])  [phone] [address], [place] [address] [phone]  [facility]  [address] [place] [place] [address] [phone]  [facility]  [address], [address] [place] [address] [phone]  [facility] in [place]  [address] [place], [place] [address] [phone]  [facility] [address] [place], [place] [address] [phone]  [facility] [address] [place], [place] [address] [phone]  [facility] [place], [place] [address] [phone]  Hypertension, Adult High blood pressure (hypertension) is when the force of blood pumping through the arteries is too strong. The arteries are the blood vessels that carry blood from the heart throughout the body. Hypertension forces the heart to work harder to pump blood and may cause arteries to become narrow or stiff. Untreated or uncontrolled hypertension can lead to a heart attack, heart failure, a stroke, kidney disease, and other problems. A blood pressure reading consists of a higher number over a lower number. Ideally, your blood pressure should be below 120/80. The first ("top") number is called the systolic pressure. It is a measure of the pressure in your arteries as your heart beats. The second ("bottom") number is called the diastolic pressure. It is a measure of the pressure in your arteries as the heart relaxes. What are the causes? The exact cause of this condition is not known. There are some conditions that result in high blood pressure. What increases the risk? Certain factors may make you more likely to develop high blood pressure. Some of these risk factors  are under your control, including: Smoking. Not getting enough exercise or physical activity. Being overweight. Having too much fat, sugar, calories, or salt (sodium) in your diet. Drinking too much alcohol. Other risk factors include: Having a personal history of heart disease, diabetes, high cholesterol, or kidney  disease. Stress. Having a family history of high blood pressure and high cholesterol. Having obstructive sleep apnea. Age. The risk increases with age. What are the signs or symptoms? High blood pressure may not cause symptoms. Very high blood pressure (hypertensive crisis) may cause: Headache. Fast or irregular heartbeats (palpitations). Shortness of breath. Nosebleed. Nausea and vomiting. Vision changes. Severe chest pain, dizziness, and seizures. How is this diagnosed? This condition is diagnosed by measuring your blood pressure while you are seated, with your arm resting on a flat surface, your legs uncrossed, and your feet flat on the floor. The cuff of the blood pressure monitor will be placed directly against the skin of your upper arm at the level of your heart. Blood pressure should be measured at least twice using the same arm. Certain conditions can cause a difference in blood pressure between your right and left arms. If you have a high blood pressure reading during one visit or you have normal blood pressure with other risk factors, you may be asked to: Return on a different day to have your blood pressure checked again. Monitor your blood pressure at home for 1 week or longer. If you are diagnosed with hypertension, you may have other blood or imaging tests to help your health care provider understand your overall risk for other conditions. How is this treated? This condition is treated by making healthy lifestyle changes, such as eating healthy foods, exercising more, and reducing your alcohol intake. You may be referred for counseling on a healthy diet and physical activity. Your health care provider may prescribe medicine if lifestyle changes are not enough to get your blood pressure under control and if: Your systolic blood pressure is above 130. Your diastolic blood pressure is above 80. Your personal target blood pressure may vary depending on your medical conditions,  your age, and other factors. Follow these instructions at home: Eating and drinking  Eat a diet that is high in fiber and potassium, and low in sodium, added sugar, and fat. An example of this eating plan is called the DASH diet. DASH stands for Dietary Approaches to Stop Hypertension. To eat this way: Eat plenty of fresh fruits and vegetables. Try to fill one half of your plate at each meal with fruits and vegetables. Eat whole grains, such as whole-wheat pasta, brown rice, or whole-grain bread. Fill about one fourth of your plate with whole grains. Eat or drink low-fat dairy products, such as skim milk or low-fat yogurt. Avoid fatty cuts of meat, processed or cured meats, and poultry with skin. Fill about one fourth of your plate with lean proteins, such as fish, chicken without skin, beans, eggs, or tofu. Avoid pre-made and processed foods. These tend to be higher in sodium, added sugar, and fat. Reduce your daily sodium intake. Many people with hypertension should eat less than 1,500 mg of sodium a day. Do not drink alcohol if: Your health care provider tells you not to drink. You are pregnant, may be pregnant, or are planning to become pregnant. If you drink alcohol: Limit how much you have to: 0-1 drink a day for women. 0-2 drinks a day for men. Know how much alcohol is in your drink. In the  [place] one drink equals one 12 oz bottle of beer (355 mL), one 5 oz glass of wine (148 mL), or one 1 oz glass of hard liquor (44 mL). Lifestyle  Work with your health care provider to maintain a healthy body weight or to lose weight. Ask what an ideal weight is for you. Get at least 30 minutes of exercise that causes your heart to beat faster (aerobic exercise) most days of the week. Activities may include walking, swimming, or biking. Include exercise to strengthen your muscles (resistance exercise), such as Pilates or lifting weights, as part of your weekly exercise routine. Try to do these types  of exercises for 30 minutes at least 3 days a week. Do not use any products that contain nicotine or tobacco. These products include cigarettes, chewing tobacco, and vaping devices, such as e-cigarettes. If you need help quitting, ask your health care provider. Monitor your blood pressure at home as told by your health care provider. Keep all follow-up visits. This is important. Medicines Take over-the-counter and prescription medicines only as told by your health care provider. Follow directions carefully. Blood pressure medicines must be taken as prescribed. Do not skip doses of blood pressure medicine. Doing this puts you at risk for problems and can make the medicine less effective. Ask your health care provider about side effects or reactions to medicines that you should watch for. Contact a health care provider if you: Think you are having a reaction to a medicine you are taking. Have headaches that keep coming back (recurring). Feel dizzy. Have swelling in your ankles. Have trouble with your vision. Get help right away if you: Develop a severe headache or confusion. Have unusual weakness or numbness. Feel faint. Have severe pain in your chest or abdomen. Vomit repeatedly. Have trouble breathing. These symptoms may be an emergency. Get help right away. Call 911. Do not wait to see if the symptoms will go away. Do not drive yourself to the hospital. Summary Hypertension is when the force of blood pumping through your arteries is too strong. If this condition is not controlled, it may put you at risk for serious complications. Your personal target blood pressure may vary depending on your medical conditions, your age, and other factors. For most people, a normal blood pressure is less than 120/80. Hypertension is treated with lifestyle changes, medicines, or a combination of both. Lifestyle changes include losing weight, eating a healthy, low-sodium diet, exercising more, and limiting  alcohol. This information is not intended to replace advice given to you by your health care provider. Make sure you discuss any questions you have with your health care provider. Document Revised: 01/14/2021 Document Reviewed: 01/14/2021 Elsevier Patient Education  2024 [employer].

## 2022-12-08 ENCOUNTER — Ambulatory Visit: Payer: 59 | Admitting: Family

## 2022-12-08 ENCOUNTER — Ambulatory Visit (INDEPENDENT_AMBULATORY_CARE_PROVIDER_SITE_OTHER): Payer: 59

## 2022-12-08 ENCOUNTER — Encounter: Payer: Self-pay | Admitting: Family

## 2022-12-08 VITALS — BP 155/76 | HR 63 | Temp 98.1°F | Ht 62.0 in | Wt 213.0 lb

## 2022-12-08 DIAGNOSIS — E119 Type 2 diabetes mellitus without complications: Secondary | ICD-10-CM | POA: Diagnosis not present

## 2022-12-08 DIAGNOSIS — E1169 Type 2 diabetes mellitus with other specified complication: Secondary | ICD-10-CM

## 2022-12-08 DIAGNOSIS — K219 Gastro-esophageal reflux disease without esophagitis: Secondary | ICD-10-CM

## 2022-12-08 DIAGNOSIS — I10 Essential (primary) hypertension: Secondary | ICD-10-CM

## 2022-12-08 DIAGNOSIS — M159 Polyosteoarthritis, unspecified: Secondary | ICD-10-CM

## 2022-12-08 DIAGNOSIS — E785 Hyperlipidemia, unspecified: Secondary | ICD-10-CM

## 2022-12-08 LAB — HM DIABETES EYE EXAM

## 2022-12-08 LAB — CMP14+EGFR
ALT: 24 IU/L (ref 0–32)
AST: 17 IU/L (ref 0–40)
Albumin: 4.5 g/dL (ref 3.9–4.9)
Alkaline Phosphatase: 85 IU/L (ref 44–121)
BUN/Creatinine Ratio: 13 (ref 12–28)
BUN: 13 mg/dL (ref 8–27)
Bilirubin Total: 0.5 mg/dL (ref 0.0–1.2)
CO2: 24 mmol/L (ref 20–29)
Calcium: 9.7 mg/dL (ref 8.7–10.3)
Chloride: 103 mmol/L (ref 96–106)
Creatinine, Ser: 0.99 mg/dL (ref 0.57–1.00)
Globulin, Total: 2.8 g/dL (ref 1.5–4.5)
Glucose: 118 mg/dL — ABNORMAL HIGH (ref 70–99)
Potassium: 4.9 mmol/L (ref 3.5–5.2)
Sodium: 140 mmol/L (ref 134–144)
Total Protein: 7.3 g/dL (ref 6.0–8.5)
eGFR: 64 mL/min/{1.73_m2} (ref >59)

## 2022-12-08 LAB — BAYER DCA HB A1C WAIVED: HB A1C (BAYER DCA - WAIVED): 6 % — ABNORMAL HIGH (ref 4.8–5.6)

## 2022-12-08 MED ORDER — ATENOLOL 50 MG PO TABS: ORAL_TABLET | ORAL | 3 refills | Status: DC

## 2022-12-08 NOTE — Progress Notes (Signed)
Jackie Ayala arrived 12/08/2022 and has given verbal consent to obtain images and complete their overdue diabetic retinal screening.  The images have been sent to an ophthalmologist or optometrist for review and interpretation.  Results will be sent back to Jackie Spencer, FNP for review.  Patient has been informed they will be contacted when we receive the results via telephone or MyChart

## 2022-12-08 NOTE — Progress Notes (Signed)
Subjective:    Patient ID: Jackie Ayala, female    DOB: 26-Dec-1960, 62 y.o.   MRN: [id number]  Chief Complaint  Patient presents with   Medical Management of Chronic Issues   Pt presents to the office today for  chronic follow up. She is followed by Ortho as needed and had lumbar surgery on 09/06/20.    She is morbid obese with a BMI of 38 and HTN and Hyperlipemia.    She cares for her husband.  Hypertension This is a chronic problem. The current episode started more than 1 year ago. The problem has been waxing and waning since onset. The problem is uncontrolled. Associated symptoms include malaise/fatigue. Pertinent negatives include no blurred vision, peripheral edema or shortness of breath. Risk factors for coronary artery disease include dyslipidemia, diabetes mellitus, obesity and sedentary lifestyle. The current treatment provides moderate improvement.  Gastroesophageal Reflux She complains of belching and heartburn. This is a chronic problem. The current episode started more than 1 year ago. The problem occurs occasionally. Risk factors include obesity. She has tried a PPI for the symptoms. The treatment provided moderate relief.  Diabetes She presents for her follow-up diabetic visit. She has type 2 diabetes mellitus. Pertinent negatives for diabetes include no blurred vision and no foot paresthesias. Symptoms are stable. Risk factors for coronary artery disease include dyslipidemia, diabetes mellitus, hypertension, sedentary lifestyle and post-menopausal. She is following a generally unhealthy diet. (Does not check glucose )  Arthritis Presents for follow-up visit. She complains of pain and stiffness. Affected locations include the left ankle, right ankle and right hip. Her pain is at a severity of 4/10.  Hyperlipidemia This is a chronic problem. The current episode started more than 1 year ago. The problem is controlled. Recent lipid tests were reviewed and are normal. Exacerbating  diseases include obesity. Pertinent negatives include no shortness of breath. Current antihyperlipidemic treatment includes statins. The current treatment provides moderate improvement of lipids. Risk factors for coronary artery disease include dyslipidemia, diabetes mellitus, hypertension, a sedentary lifestyle and post-menopausal.      Review of Systems  Constitutional:  Positive for malaise/fatigue.  Eyes:  Negative for blurred vision.  Respiratory:  Negative for shortness of breath.   Gastrointestinal:  Positive for heartburn.  Musculoskeletal:  Positive for arthritis and stiffness.  All other systems reviewed and are negative.      Objective:   Physical Exam Vitals reviewed.  Constitutional:      General: She is not in acute distress.    Appearance: She is well-developed. She is obese.  HENT:     Head: Normocephalic and atraumatic.     Right Ear: Tympanic membrane normal.     Left Ear: Tympanic membrane normal.  Eyes:     Pupils: Pupils are equal, round, and reactive to light.  Neck:     Thyroid: No thyromegaly.  Cardiovascular:     Rate and Rhythm: Normal rate and regular rhythm.     Heart sounds: Normal heart sounds. No murmur heard. Pulmonary:     Effort: Pulmonary effort is normal. No respiratory distress.     Breath sounds: Normal breath sounds. No wheezing.  Abdominal:     General: Bowel sounds are normal. There is no distension.     Palpations: Abdomen is soft.     Tenderness: There is no abdominal tenderness.  Musculoskeletal:        General: No tenderness. Normal range of motion.     Cervical back: Normal range of motion  and neck supple.  Skin:    General: Skin is warm and dry.  Neurological:     Mental Status: She is alert and oriented to person, place, and time.     Cranial Nerves: No cranial nerve deficit.     Deep Tendon Reflexes: Reflexes are normal and symmetric.  Psychiatric:        Mood and Affect: Mood is anxious.        Behavior: Behavior  normal.        Thought Content: Thought content normal.        Judgment: Judgment normal.         BP (!) 155/76   Pulse 63   Temp 98.1 F (36.7 C) (Temporal)   Ht 5\' 2"  (1.575 m)   Wt 213 lb (96.6 kg)   SpO2 97%   BMI 38.96 kg/m   Assessment & Plan:  Jackie Ayala comes in today with chief complaint of Medical Management of Chronic Issues   Diagnosis and orders addressed:  1. Essential hypertension - atenolol (TENORMIN) 50 MG tablet; TAKE 1 TABLET BY MOUTH EVERY DAY **NEEDS TO BE SEEN BEFORE NEXT REFILL**  Dispense: 90 tablet; Refill: 3 - CMP14+EGFR  2. Primary osteoarthritis involving multiple joints - CMP14+EGFR  3. Morbid obesity (HCC) - CMP14+EGFR  4. Hyperlipidemia associated with type 2 diabetes mellitus (HCC) - CMP14+EGFR  5. Gastroesophageal reflux disease, unspecified whether esophagitis present - CMP14+EGFR  6. Type 2 diabetes mellitus with other specified complication, without long-term current use of insulin (HCC) - Microalbumin / creatinine urine ratio - Bayer DCA Hb A1c Waived - CMP14+EGFR  BP not at goal today, will monitor at home. Low salt diet, exercise  Labs pending Health Maintenance reviewed Diet and exercise encouraged  Follow up plan: 3 months for HTN   Jannifer Rodney, FNP

## 2022-12-10 DIAGNOSIS — M9903 Segmental and somatic dysfunction of lumbar region: Secondary | ICD-10-CM | POA: Diagnosis not present

## 2022-12-10 DIAGNOSIS — M5432 Sciatica, left side: Secondary | ICD-10-CM | POA: Diagnosis not present

## 2022-12-31 DIAGNOSIS — M5432 Sciatica, left side: Secondary | ICD-10-CM | POA: Diagnosis not present

## 2022-12-31 DIAGNOSIS — M9903 Segmental and somatic dysfunction of lumbar region: Secondary | ICD-10-CM | POA: Diagnosis not present

## 2022-12-31 IMAGING — US US SOFT TISSUE HEAD/NECK
1 series · 14 of 23 positions shown · non-contrast
Comparison: No pertinent prior exams available for comparison.

CLINICAL DATA: Provided history: Lipoma of neck. Lipoma right
lateral side of neck.

EXAM:
ULTRASOUND OF HEAD/NECK SOFT TISSUES
TECHNIQUE: Ultrasound examination of the head/neck soft tissues was performed
in the area of clinical concern.

[Series 1: us soft tissue head & neck (non-thyroid) · 23 acquisitions, 14 frames shown]
[im 1/23]
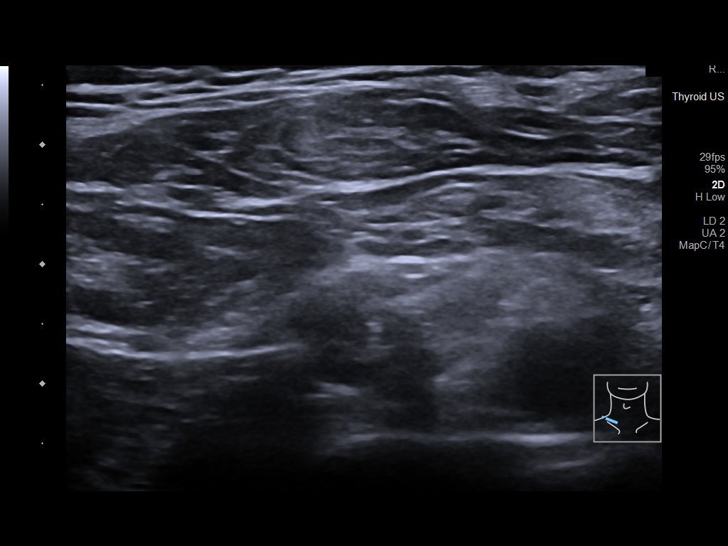
[im 3/23]
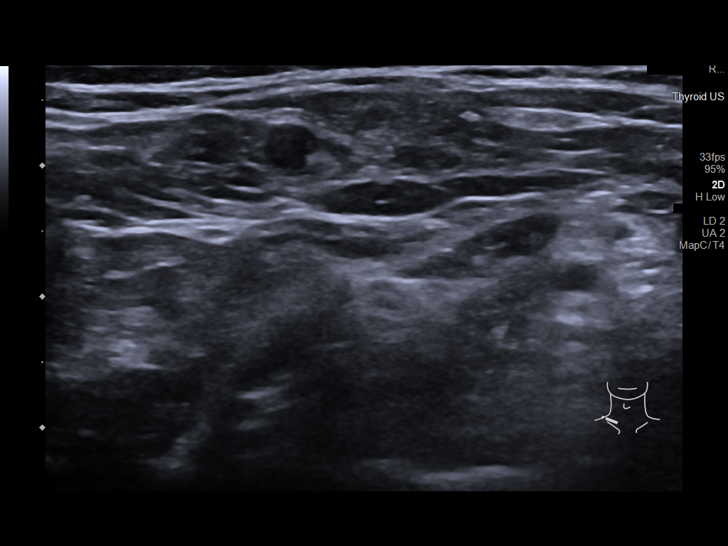
[im 5/23]
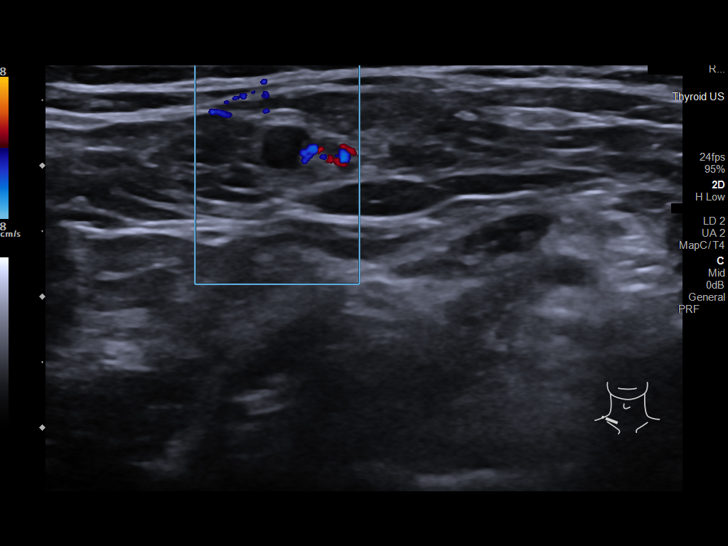
[im 6/23]
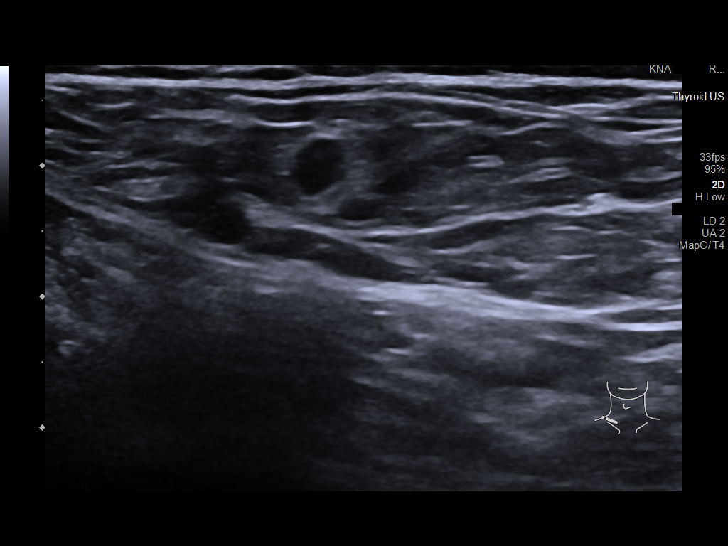
[im 8/23]
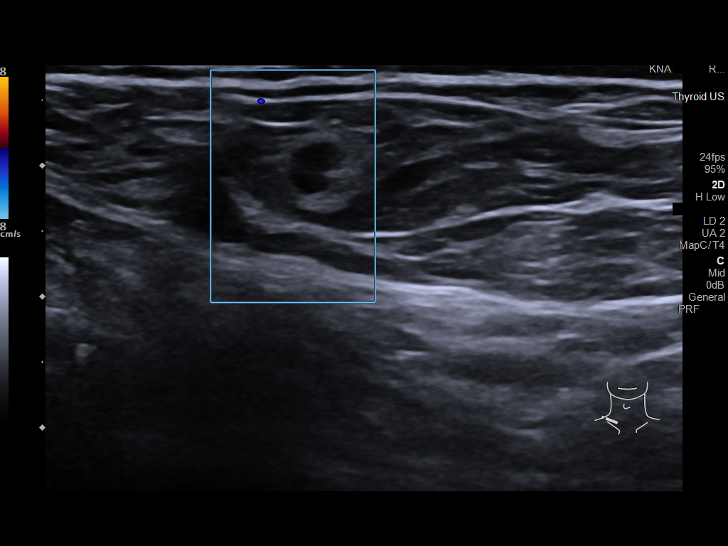
[im 10/23]
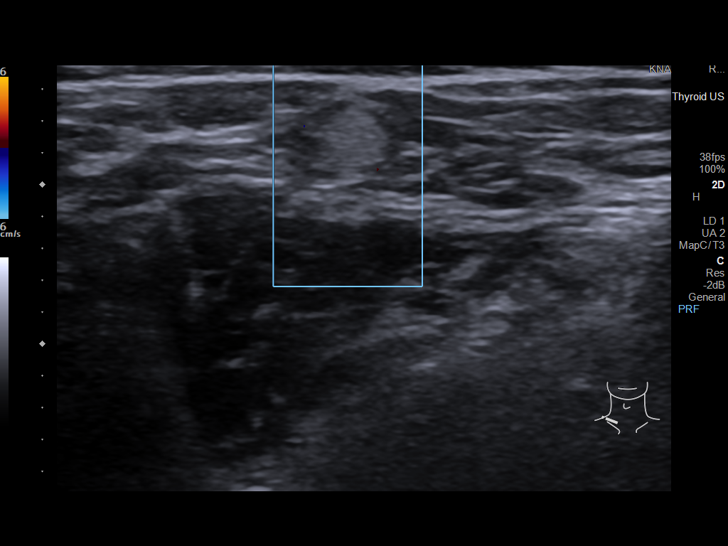
[im 11/23]
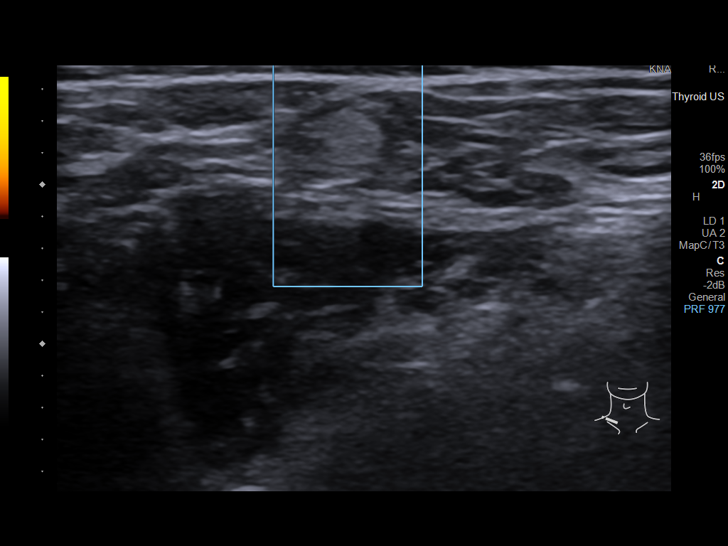
[im 13/23]
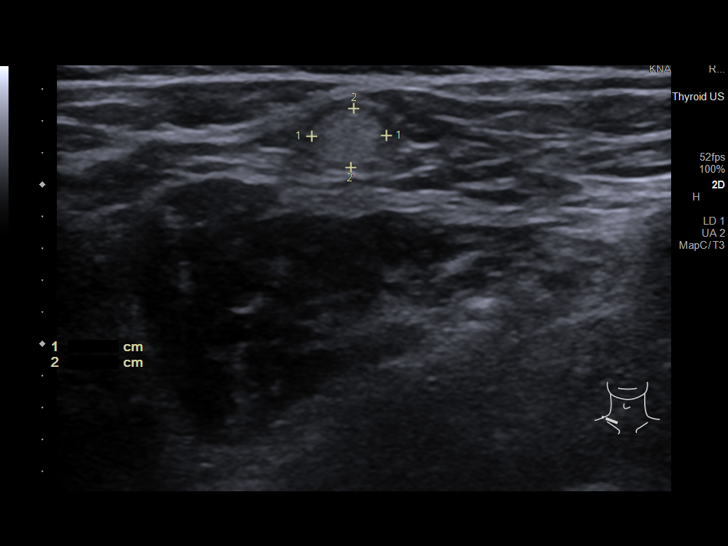
[im 14/23]
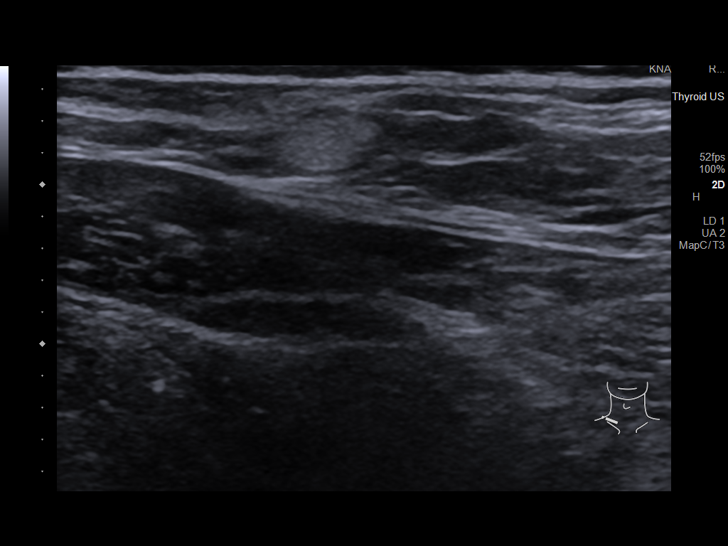
[im 16/23]
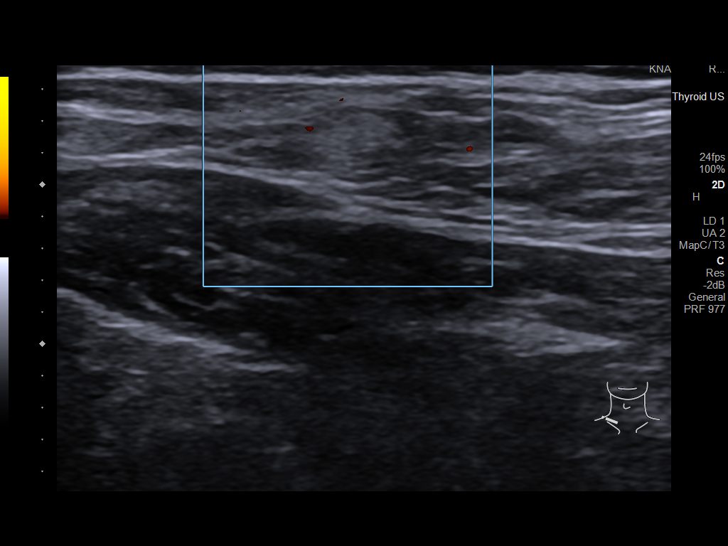
[im 18/23]
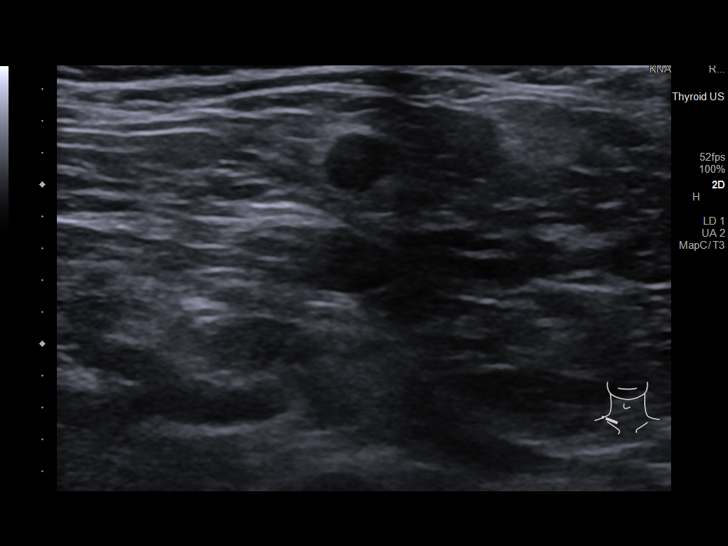
[im 19/23]
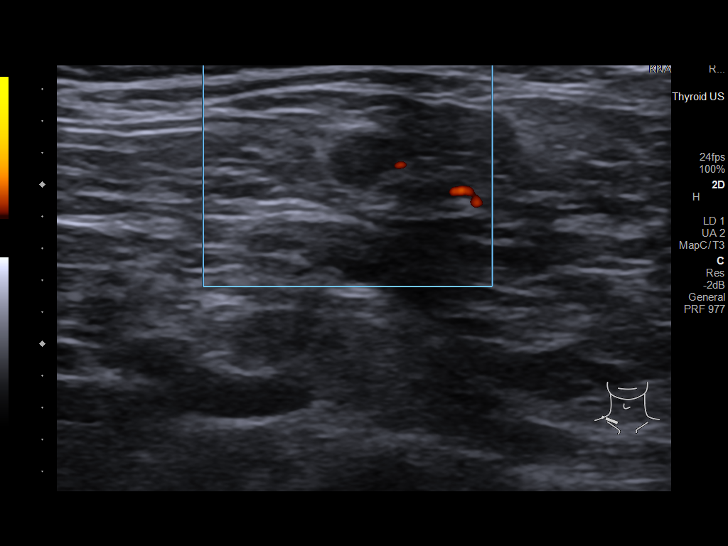
[im 21/23]
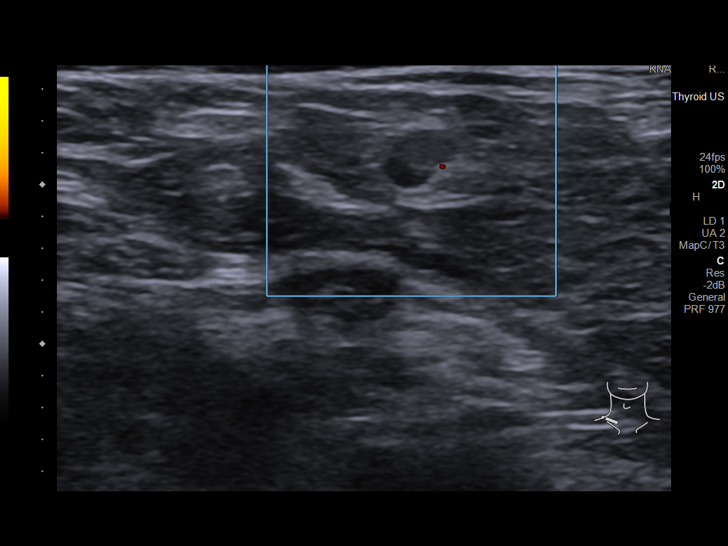
[im 23/23]
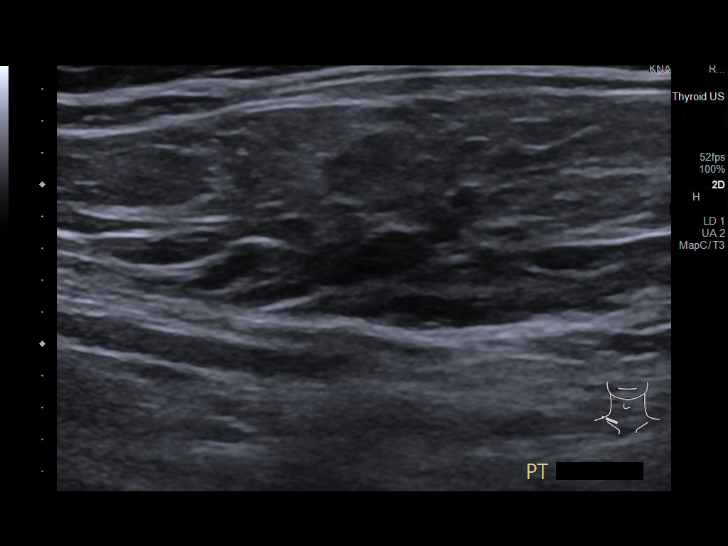

[14 of 23 positions shown; findings below may reference images not displayed]

FINDINGS: Targeted ultrasound was performed in the right neck region of
concern. At this site, there is a well-circumscribed round to ovoid
hyperechoic mass within the subcutaneous soft tissues. The mass
measures 0.5 x 0.4 x 0.8 cm. There is minimal associated color
Doppler flow. Small, non-enlarged lymph nodes are also present at
this site.
IMPRESSION: 0.5 x 0.4 x 0.8 cm well-circumscribed hyperechoic mass within the
subcutaneous soft tissues of the right neck (at the region of
concern). This may reflect a lipoma. However, the imaging features
are nonspecific and a neck CT may be obtained to exclude alternative
etiologies as clinically warranted.

## 2023-03-09 ENCOUNTER — Ambulatory Visit: Payer: 59 | Admitting: Family

## 2023-03-09 ENCOUNTER — Encounter: Payer: Self-pay | Admitting: Family

## 2023-03-09 VITALS — BP 149/80 | HR 55 | Temp 97.4°F | Ht 62.0 in | Wt 213.8 lb

## 2023-03-09 DIAGNOSIS — M150 Primary generalized (osteo)arthritis: Secondary | ICD-10-CM | POA: Diagnosis not present

## 2023-03-09 DIAGNOSIS — E1169 Type 2 diabetes mellitus with other specified complication: Secondary | ICD-10-CM

## 2023-03-09 DIAGNOSIS — I10 Essential (primary) hypertension: Secondary | ICD-10-CM

## 2023-03-09 DIAGNOSIS — E785 Hyperlipidemia, unspecified: Secondary | ICD-10-CM | POA: Diagnosis not present

## 2023-03-09 DIAGNOSIS — Z6839 Body mass index (BMI) 39.0-39.9, adult: Secondary | ICD-10-CM

## 2023-03-09 DIAGNOSIS — K219 Gastro-esophageal reflux disease without esophagitis: Secondary | ICD-10-CM | POA: Diagnosis not present

## 2023-03-09 LAB — CMP14+EGFR
ALT: 21 [IU]/L (ref 0–32)
AST: 16 [IU]/L (ref 0–40)
Albumin: 4.3 g/dL (ref 3.9–4.9)
Alkaline Phosphatase: 84 [IU]/L (ref 44–121)
BUN/Creatinine Ratio: 16 (ref 12–28)
BUN: 17 mg/dL (ref 8–27)
Bilirubin Total: 0.3 mg/dL (ref 0.0–1.2)
CO2: 24 mmol/L (ref 20–29)
Calcium: 9.8 mg/dL (ref 8.7–10.3)
Chloride: 104 mmol/L (ref 96–106)
Creatinine, Ser: 1.04 mg/dL — ABNORMAL HIGH (ref 0.57–1.00)
Globulin, Total: 2.9 g/dL (ref 1.5–4.5)
Glucose: 106 mg/dL — ABNORMAL HIGH (ref 70–99)
Potassium: 4.9 mmol/L (ref 3.5–5.2)
Sodium: 141 mmol/L (ref 134–144)
Total Protein: 7.2 g/dL (ref 6.0–8.5)
eGFR: 60 mL/min/{1.73_m2} (ref >59)

## 2023-03-09 MED ORDER — ATENOLOL 50 MG PO TABS
ORAL_TABLET | ORAL | 3 refills | Status: DC
Start: 2023-03-09 — End: 2024-02-02

## 2023-03-09 MED ORDER — ROSUVASTATIN CALCIUM 5 MG PO TABS: 5.0000 mg | ORAL_TABLET | Freq: Every day | ORAL | 3 refills | Status: DC

## 2023-03-09 MED ORDER — LISINOPRIL 20 MG PO TABS: 20.0000 mg | ORAL_TABLET | Freq: Every day | ORAL | 3 refills | Status: DC

## 2023-03-09 NOTE — Patient Instructions (Signed)
Hypertension, Adult High blood pressure (hypertension) is when the force of blood pumping through the arteries is too strong. The arteries are the blood vessels that carry blood from the heart throughout the body. Hypertension forces the heart to work harder to pump blood and may cause arteries to become narrow or stiff. Untreated or uncontrolled hypertension can lead to a heart attack, heart failure, a stroke, kidney disease, and other problems. A blood pressure reading consists of a higher number over a lower number. Ideally, your blood pressure should be below 120/80. The first ("top") number is called the systolic pressure. It is a measure of the pressure in your arteries as your heart beats. The second ("bottom") number is called the diastolic pressure. It is a measure of the pressure in your arteries as the heart relaxes. What are the causes? The exact cause of this condition is not known. There are some conditions that result in high blood pressure. What increases the risk? Certain factors may make you more likely to develop high blood pressure. Some of these risk factors are under your control, including: Smoking. Not getting enough exercise or physical activity. Being overweight. Having too much fat, sugar, calories, or salt (sodium) in your diet. Drinking too much alcohol. Other risk factors include: Having a personal history of heart disease, diabetes, high cholesterol, or kidney disease. Stress. Having a family history of high blood pressure and high cholesterol. Having obstructive sleep apnea. Age. The risk increases with age. What are the signs or symptoms? High blood pressure may not cause symptoms. Very high blood pressure (hypertensive crisis) may cause: Headache. Fast or irregular heartbeats (palpitations). Shortness of breath. Nosebleed. Nausea and vomiting. Vision changes. Severe chest pain, dizziness, and seizures. How is this diagnosed? This condition is diagnosed by  measuring your blood pressure while you are seated, with your arm resting on a flat surface, your legs uncrossed, and your feet flat on the floor. The cuff of the blood pressure monitor will be placed directly against the skin of your upper arm at the level of your heart. Blood pressure should be measured at least twice using the same arm. Certain conditions can cause a difference in blood pressure between your right and left arms. If you have a high blood pressure reading during one visit or you have normal blood pressure with other risk factors, you may be asked to: Return on a different day to have your blood pressure checked again. Monitor your blood pressure at home for 1 week or longer. If you are diagnosed with hypertension, you may have other blood or imaging tests to help your health care provider understand your overall risk for other conditions. How is this treated? This condition is treated by making healthy lifestyle changes, such as eating healthy foods, exercising more, and reducing your alcohol intake. You may be referred for counseling on a healthy diet and physical activity. Your health care provider may prescribe medicine if lifestyle changes are not enough to get your blood pressure under control and if: Your systolic blood pressure is above 130. Your diastolic blood pressure is above 80. Your personal target blood pressure may vary depending on your medical conditions, your age, and other factors. Follow these instructions at home: Eating and drinking  Eat a diet that is high in fiber and potassium, and low in sodium, added sugar, and fat. An example of this eating plan is called the DASH diet. DASH stands for Dietary Approaches to Stop Hypertension. To eat this way: Eat   plenty of fresh fruits and vegetables. Try to fill one half of your plate at each meal with fruits and vegetables. Eat whole grains, such as whole-wheat pasta, brown rice, or whole-grain bread. Fill about one  fourth of your plate with whole grains. Eat or drink low-fat dairy products, such as skim milk or low-fat yogurt. Avoid fatty cuts of meat, processed or cured meats, and poultry with skin. Fill about one fourth of your plate with lean proteins, such as fish, chicken without skin, beans, eggs, or tofu. Avoid pre-made and processed foods. These tend to be higher in sodium, added sugar, and fat. Reduce your daily sodium intake. Many people with hypertension should eat less than 1,500 mg of sodium a day. Do not drink alcohol if: Your health care provider tells you not to drink. You are pregnant, may be pregnant, or are planning to become pregnant. If you drink alcohol: Limit how much you have to: 0-1 drink a day for women. 0-2 drinks a day for men. Know how much alcohol is in your drink. In the [place] one drink equals one 12 oz bottle of beer (355 mL), one 5 oz glass of wine (148 mL), or one 1 oz glass of hard liquor (44 mL). Lifestyle  Work with your health care provider to maintain a healthy body weight or to lose weight. Ask what an ideal weight is for you. Get at least 30 minutes of exercise that causes your heart to beat faster (aerobic exercise) most days of the week. Activities may include walking, swimming, or biking. Include exercise to strengthen your muscles (resistance exercise), such as Pilates or lifting weights, as part of your weekly exercise routine. Try to do these types of exercises for 30 minutes at least 3 days a week. Do not use any products that contain nicotine or tobacco. These products include cigarettes, chewing tobacco, and vaping devices, such as e-cigarettes. If you need help quitting, ask your health care provider. Monitor your blood pressure at home as told by your health care provider. Keep all follow-up visits. This is important. Medicines Take over-the-counter and prescription medicines only as told by your health care provider. Follow directions carefully. Blood  pressure medicines must be taken as prescribed. Do not skip doses of blood pressure medicine. Doing this puts you at risk for problems and can make the medicine less effective. Ask your health care provider about side effects or reactions to medicines that you should watch for. Contact a health care provider if you: Think you are having a reaction to a medicine you are taking. Have headaches that keep coming back (recurring). Feel dizzy. Have swelling in your ankles. Have trouble with your vision. Get help right away if you: Develop a severe headache or confusion. Have unusual weakness or numbness. Feel faint. Have severe pain in your chest or abdomen. Vomit repeatedly. Have trouble breathing. These symptoms may be an emergency. Get help right away. Call 911. Do not wait to see if the symptoms will go away. Do not drive yourself to the hospital. Summary Hypertension is when the force of blood pumping through your arteries is too strong. If this condition is not controlled, it may put you at risk for serious complications. Your personal target blood pressure may vary depending on your medical conditions, your age, and other factors. For most people, a normal blood pressure is less than 120/80. Hypertension is treated with lifestyle changes, medicines, or a combination of both. Lifestyle changes include losing weight, eating a healthy,   low-sodium diet, exercising more, and limiting alcohol. This information is not intended to replace advice given to you by your health care provider. Make sure you discuss any questions you have with your health care provider. Document Revised: 01/14/2021 Document Reviewed: 01/14/2021 Elsevier Patient Education  2024 [employer]  

## 2023-03-09 NOTE — Progress Notes (Signed)
Subjective:    Patient ID: Jackie Ayala, female    DOB: 1960/10/03, 62 y.o.   MRN: [id number]  Chief Complaint  Patient presents with   Medical Management of Chronic Issues   Pt presents to the office today for  chronic follow up. She is followed by Ortho as needed and had lumbar surgery on 09/06/20.    She is morbid obese with a BMI of 39 and HTN and Hyperlipemia.    She cares for her husband.  Hypertension This is a chronic problem. The current episode started more than 1 year ago. The problem has been gradually worsening since onset. The problem is uncontrolled. Associated symptoms include malaise/fatigue. Pertinent negatives include no blurred vision, peripheral edema or shortness of breath. Risk factors for coronary artery disease include dyslipidemia, diabetes mellitus, obesity and sedentary lifestyle. The current treatment provides moderate improvement.  Gastroesophageal Reflux She complains of belching and heartburn. The current episode started more than 1 year ago. The problem occurs occasionally. The symptoms are aggravated by bending and certain foods. She has tried an antacid and a diet change for the symptoms. The treatment provided moderate relief.  Hyperlipidemia This is a chronic problem. The current episode started more than 1 year ago. The problem is controlled. Recent lipid tests were reviewed and are normal. Exacerbating diseases include obesity. Pertinent negatives include no shortness of breath. Current antihyperlipidemic treatment includes statins. The current treatment provides mild improvement of lipids. Risk factors for coronary artery disease include dyslipidemia, diabetes mellitus, hypertension, a sedentary lifestyle and post-menopausal.  Diabetes She presents for her follow-up diabetic visit. Pertinent negatives for diabetes include no blurred vision and no foot paresthesias. Symptoms are stable. Risk factors for coronary artery disease include dyslipidemia,  diabetes mellitus, hypertension, sedentary lifestyle and post-menopausal. (Does not check glucose at home) Eye exam is current.  Arthritis Presents for follow-up visit. She complains of pain and stiffness. Affected locations include the right hip and left shoulder. Her pain is at a severity of 4/10.      Review of Systems  Constitutional:  Positive for malaise/fatigue.  Eyes:  Negative for blurred vision.  Respiratory:  Negative for shortness of breath.   Gastrointestinal:  Positive for heartburn.  Musculoskeletal:  Positive for arthritis and stiffness.  All other systems reviewed and are negative.      Objective:   Physical Exam Vitals reviewed.  Constitutional:      General: She is not in acute distress.    Appearance: She is well-developed. She is obese.  HENT:     Head: Normocephalic and atraumatic.     Right Ear: Tympanic membrane normal.     Left Ear: Tympanic membrane normal.  Eyes:     Pupils: Pupils are equal, round, and reactive to light.  Neck:     Thyroid: No thyromegaly.  Cardiovascular:     Rate and Rhythm: Normal rate and regular rhythm.     Heart sounds: Normal heart sounds. No murmur heard. Pulmonary:     Effort: Pulmonary effort is normal. No respiratory distress.     Breath sounds: Normal breath sounds. No wheezing.  Abdominal:     General: Bowel sounds are normal. There is no distension.     Palpations: Abdomen is soft.     Tenderness: There is no abdominal tenderness.  Musculoskeletal:        General: No tenderness. Normal range of motion.     Cervical back: Normal range of motion and neck supple.  Right lower leg: No edema.     Left lower leg: Edema (trace) present.  Skin:    General: Skin is warm and dry.  Neurological:     Mental Status: She is alert and oriented to person, place, and time.     Cranial Nerves: No cranial nerve deficit.     Deep Tendon Reflexes: Reflexes are normal and symmetric.  Psychiatric:        Behavior: Behavior  normal.        Thought Content: Thought content normal.        Judgment: Judgment normal.       BP (!) 149/80   Pulse (!) 55   Temp (!) 97.4 F (36.3 C) (Temporal)   Ht 5\' 2"  (1.575 m)   Wt 213 lb 12.8 oz (97 kg)   SpO2 98%   BMI 39.10 kg/m      Assessment & Plan:   Jackie Ayala comes in today with chief complaint of Medical Management of Chronic Issues   Diagnosis and orders addressed:  1. Essential hypertension - atenolol (TENORMIN) 50 MG tablet; TAKE 1 TABLET BY MOUTH EVERY DAY  Dispense: 90 tablet; Refill: 3 - lisinopril (ZESTRIL) 20 MG tablet; Take 1 tablet (20 mg total) by mouth daily.  Dispense: 90 tablet; Refill: 3 - CMP14+EGFR  2. Type 2 diabetes mellitus with other specified complication, without long-term current use of insulin (HCC) (Primary) - CMP14+EGFR  3. Gastroesophageal reflux disease, unspecified whether esophagitis present - CMP14+EGFR  4. Hyperlipidemia associated with type 2 diabetes mellitus (HCC) - CMP14+EGFR  5. Morbid obesity (HCC) - CMP14+EGFR  6. Primary osteoarthritis involving multiple joints - CMP14+EGFR  7. Hyperlipidemia, unspecified hyperlipidemia type - rosuvastatin (CRESTOR) 5 MG tablet; Take 1 tablet (5 mg total) by mouth daily.  Dispense: 90 tablet; Refill: 3   Labs pending Start Lisinpril 20 mg and Crestor 5 mg -Dash diet information given -Exercise encouraged - Stress Management  -Continue current meds Health Maintenance reviewed Diet and exercise encouraged  Follow up plan: 2-4 weeks to recheck HTN  Jannifer Rodney, FNP

## 2023-03-10 ENCOUNTER — Telehealth: Payer: Self-pay

## 2023-03-10 NOTE — Telephone Encounter (Signed)
Left message informing pt christy was off today and has not reviewed her labs yet but once she reviews them we will call

## 2023-03-10 NOTE — Telephone Encounter (Signed)
Copied from CRM [id number]. Topic: Clinical - Lab/Test Results >> Mar 10, 2023  9:02 AM Dennison Nancy wrote: Reason for CRM: request labs result from yesterday and requesting a print out copy of the results , need explanation of the results explaining with the printout

## 2023-03-19 ENCOUNTER — Telehealth: Payer: Self-pay

## 2023-03-19 NOTE — Progress Notes (Signed)
 [facility] Assistant attempted to call patient on today regarding preventative mammogram screening. No answer from patient after multiple rings. Assistant left confidential voicemail for patient to return call.  Will call back patient back for final attempt.   Baruch Gouty [place]/VBCI  [facility] Assistant-Population Health [phone]

## 2023-04-05 ENCOUNTER — Ambulatory Visit (INDEPENDENT_AMBULATORY_CARE_PROVIDER_SITE_OTHER): Payer: Medicare Other | Admitting: Family

## 2023-04-05 ENCOUNTER — Encounter: Payer: Self-pay | Admitting: Family

## 2023-04-05 ENCOUNTER — Telehealth: Payer: Self-pay | Admitting: Family

## 2023-04-05 VITALS — BP 132/71 | HR 54 | Temp 97.9°F | Ht 62.0 in | Wt 212.2 lb

## 2023-04-05 DIAGNOSIS — M150 Primary generalized (osteo)arthritis: Secondary | ICD-10-CM

## 2023-04-05 DIAGNOSIS — I10 Essential (primary) hypertension: Secondary | ICD-10-CM

## 2023-04-05 DIAGNOSIS — E1169 Type 2 diabetes mellitus with other specified complication: Secondary | ICD-10-CM

## 2023-04-05 DIAGNOSIS — E785 Hyperlipidemia, unspecified: Secondary | ICD-10-CM | POA: Diagnosis not present

## 2023-04-05 DIAGNOSIS — K219 Gastro-esophageal reflux disease without esophagitis: Secondary | ICD-10-CM | POA: Diagnosis not present

## 2023-04-05 LAB — BAYER DCA HB A1C WAIVED: HB A1C (BAYER DCA - WAIVED): 5.8 % — ABNORMAL HIGH (ref 4.8–5.6)

## 2023-04-05 MED ORDER — LANCET DEVICE MISC
1.0000 | Freq: Three times a day (TID) | 0 refills | Status: AC
Start: 2023-04-05 — End: 2023-05-05

## 2023-04-05 MED ORDER — BLOOD GLUCOSE TEST VI STRP: 1.0000 | ORAL_STRIP | Freq: Three times a day (TID) | 0 refills | Status: DC

## 2023-04-05 MED ORDER — BLOOD GLUCOSE MONITORING SUPPL DEVI
1.0000 | Freq: Three times a day (TID) | 0 refills | Status: AC
Start: 2023-04-05 — End: ?

## 2023-04-05 MED ORDER — LANCETS MISC. MISC
1.0000 | Freq: Three times a day (TID) | 0 refills | Status: AC
Start: 2023-04-05 — End: 2023-05-05

## 2023-04-05 NOTE — Progress Notes (Signed)
 Subjective:    Patient ID: Jackie Ayala, female    DOB: [birth year], 63 y.o.   MRN: [id number]  Chief Complaint  Patient presents with   Hypertension    2-4 weeks to recheck HTN   Pt presents to the office today for chronic follow up. She is followed by Ortho as needed and had lumbar surgery on 09/06/20.    She is morbid obese with a BMI of 38 and HTN and Hyperlipemia.    She cares for her husband.  Hypertension This is a chronic problem. The current episode started more than 1 year ago. The problem has been gradually worsening since onset. The problem is uncontrolled. Associated symptoms include blurred vision and malaise/fatigue. Pertinent negatives include no peripheral edema or shortness of breath. Risk factors for coronary artery disease include dyslipidemia, diabetes mellitus, obesity and sedentary lifestyle. The current treatment provides moderate improvement.  Gastroesophageal Reflux She complains of belching and heartburn. This is a chronic problem. The current episode started more than 1 year ago. The problem occurs occasionally. The symptoms are aggravated by bending and certain foods. Risk factors include obesity. She has tried an antacid and a diet change for the symptoms. The treatment provided moderate relief.  Hyperlipidemia This is a chronic problem. The current episode started more than 1 year ago. The problem is controlled. Recent lipid tests were reviewed and are normal. Exacerbating diseases include obesity. Pertinent negatives include no shortness of breath. Current antihyperlipidemic treatment includes statins. The current treatment provides mild improvement of lipids. Risk factors for coronary artery disease include dyslipidemia, diabetes mellitus, hypertension, a sedentary lifestyle and post-menopausal.  Diabetes She presents for her follow-up diabetic visit. She has type 2 diabetes mellitus. Associated symptoms include blurred vision. Pertinent negatives for diabetes  include no foot paresthesias. Symptoms are stable. Risk factors for coronary artery disease include dyslipidemia, diabetes mellitus, hypertension, sedentary lifestyle and post-menopausal. She is following a generally healthy diet. (Does not check glucose at home) Eye exam is current.  Arthritis Presents for follow-up visit. She complains of pain and stiffness. Affected locations include the right hip and left shoulder. Her pain is at a severity of 4/10.      Review of Systems  Constitutional:  Positive for malaise/fatigue.  Eyes:  Positive for blurred vision.  Respiratory:  Negative for shortness of breath.   Gastrointestinal:  Positive for heartburn.  Musculoskeletal:  Positive for stiffness.  All other systems reviewed and are negative.      Objective:   Physical Exam Vitals reviewed.  Constitutional:      General: She is not in acute distress.    Appearance: She is well-developed. She is obese.  HENT:     Head: Normocephalic and atraumatic.     Right Ear: Tympanic membrane normal.     Left Ear: Tympanic membrane normal.  Eyes:     Pupils: Pupils are equal, round, and reactive to light.  Neck:     Thyroid : No thyromegaly.  Cardiovascular:     Rate and Rhythm: Normal rate and regular rhythm.     Heart sounds: Normal heart sounds. No murmur heard. Pulmonary:     Effort: Pulmonary effort is normal. No respiratory distress.     Breath sounds: Normal breath sounds. No wheezing.  Abdominal:     General: Bowel sounds are normal. There is no distension.     Palpations: Abdomen is soft.     Tenderness: There is no abdominal tenderness.  Musculoskeletal:  General: No tenderness. Normal range of motion.     Cervical back: Normal range of motion and neck supple.     Right lower leg: No edema.     Left lower leg: Edema (trace) present.  Skin:    General: Skin is warm and dry.  Neurological:     Mental Status: She is alert and oriented to person, place, and time.      Cranial Nerves: No cranial nerve deficit.     Deep Tendon Reflexes: Reflexes are normal and symmetric.  Psychiatric:        Behavior: Behavior normal.        Thought Content: Thought content normal.        Judgment: Judgment normal.       BP 132/71   Pulse (!) 54   Temp 97.9 F (36.6 C) (Temporal)   Ht 5' 2 (1.575 m)   Wt 212 lb 3.2 oz (96.3 kg)   SpO2 97%   BMI 38.81 kg/m      Assessment & Plan:   Jackie Ayala comes in today with chief complaint of Hypertension (2-4 weeks to recheck HTN)   Diagnosis and orders addressed:  1. Essential hypertension - CMP14+EGFR - AMB Referral VBCI Care Management  2. Gastroesophageal reflux disease, unspecified whether esophagitis present - CMP14+EGFR  3. Morbid obesity (HCC) - CMP14+EGFR  4. Type 2 diabetes mellitus with other specified complication, without long-term current use of insulin (HCC) (Primary) Glucose meter Prescription sent to pharmacy  Low carb diet  Referral to pharmacy for education - Bayer DCA Hb A1c Waived - CMP14+EGFR - Blood Glucose Monitoring Suppl DEVI; 1 each by Does not apply route in the morning, at noon, and at bedtime. May substitute to any manufacturer covered by patient's insurance.  Dispense: 1 each; Refill: 0 - Glucose Blood (BLOOD GLUCOSE TEST STRIPS) STRP; 1 each by In Vitro route in the morning, at noon, and at bedtime. May substitute to any manufacturer covered by patient's insurance.  Dispense: 100 strip; Refill: 0 - Lancet Device MISC; 1 each by Does not apply route in the morning, at noon, and at bedtime. May substitute to any manufacturer covered by patient's insurance.  Dispense: 1 each; Refill: 0 - Lancets Misc. MISC; 1 each by Does not apply route in the morning, at noon, and at bedtime. May substitute to any manufacturer covered by patient's insurance.  Dispense: 100 each; Refill: 0 - AMB Referral VBCI Care Management  5. Primary osteoarthritis involving multiple joints -  CMP14+EGFR  6. Hyperlipidemia associated with type 2 diabetes mellitus (HCC) - CMP14+EGFR   Labs pending -Dash diet information given -Exercise encouraged - Stress Management  -Continue current meds Health Maintenance reviewed Diet and exercise encouraged  Follow up plan: 3 months   Bari Learn, FNP

## 2023-04-05 NOTE — Patient Instructions (Signed)
 Health Maintenance After Age 65 After age 27, you are at a higher risk for certain long-term diseases and infections as well as injuries from falls. Falls are a major cause of broken bones and head injuries in people who are older than age 73. Getting regular preventive care can help to keep you healthy and well. Preventive care includes getting regular testing and making lifestyle changes as recommended by your health care provider. Talk with your health care provider about: Which screenings and tests you should have. A screening is a test that checks for a disease when you have no symptoms. A diet and exercise plan that is right for you. What should I know about screenings and tests to prevent falls? Screening and testing are the best ways to find a health problem early. Early diagnosis and treatment give you the best chance of managing medical conditions that are common after age 90. Certain conditions and lifestyle choices may make you more likely to have a fall. Your health care provider may recommend: Regular vision checks. Poor vision and conditions such as cataracts can make you more likely to have a fall. If you wear glasses, make sure to get your prescription updated if your vision changes. Medicine review. Work with your health care provider to regularly review all of the medicines you are taking, including over-the-counter medicines. Ask your health care provider about any side effects that may make you more likely to have a fall. Tell your health care provider if any medicines that you take make you feel dizzy or sleepy. Strength and balance checks. Your health care provider may recommend certain tests to check your strength and balance while standing, walking, or changing positions. Foot health exam. Foot pain and numbness, as well as not wearing proper footwear, can make you more likely to have a fall. Screenings, including: Osteoporosis screening. Osteoporosis is a condition that causes  the bones to get weaker and break more easily. Blood pressure screening. Blood pressure changes and medicines to control blood pressure can make you feel dizzy. Depression screening. You may be more likely to have a fall if you have a fear of falling, feel depressed, or feel unable to do activities that you used to do. Alcohol  use screening. Using too much alcohol  can affect your balance and may make you more likely to have a fall. Follow these instructions at home: Lifestyle Do not drink alcohol  if: Your health care provider tells you not to drink. If you drink alcohol : Limit how much you have to: 0-1 drink a day for women. 0-2 drinks a day for men. Know how much alcohol  is in your drink. In the [place] one drink equals one 12 oz bottle of beer (355 mL), one 5 oz glass of wine (148 mL), or one 1 oz glass of hard liquor (44 mL). Do not use any products that contain nicotine or tobacco. These products include cigarettes, chewing tobacco, and vaping devices, such as e-cigarettes. If you need help quitting, ask your health care provider. Activity  Follow a regular exercise program to stay fit. This will help you maintain your balance. Ask your health care provider what types of exercise are appropriate for you. If you need a cane or walker, use it as recommended by your health care provider. Wear supportive shoes that have nonskid soles. Safety  Remove any tripping hazards, such as rugs, cords, and clutter. Install safety equipment such as grab bars in bathrooms and safety rails on stairs. Keep rooms and walkways  well-lit. General instructions Talk with your health care provider about your risks for falling. Tell your health care provider if: You fall. Be sure to tell your health care provider about all falls, even ones that seem minor. You feel dizzy, tiredness (fatigue), or off-balance. Take over-the-counter and prescription medicines only as told by your health care provider. These include  supplements. Eat a healthy diet and maintain a healthy weight. A healthy diet includes low-fat dairy products, low-fat (lean) meats, and fiber from whole grains, beans, and lots of fruits and vegetables. Stay current with your vaccines. Schedule regular health, dental, and eye exams. Summary Having a healthy lifestyle and getting preventive care can help to protect your health and wellness after age 15. Screening and testing are the best way to find a health problem early and help you avoid having a fall. Early diagnosis and treatment give you the best chance for managing medical conditions that are more common for people who are older than age 42. Falls are a major cause of broken bones and head injuries in people who are older than age 64. Take precautions to prevent a fall at home. Work with your health care provider to learn what changes you can make to improve your health and wellness and to prevent falls. This information is not intended to replace advice given to you by your health care provider. Make sure you discuss any questions you have with your health care provider. Document Revised: 07/29/2020 Document Reviewed: 07/29/2020 Elsevier Patient Education  2024 [employer].

## 2023-04-06 LAB — CMP14+EGFR
ALT: 23 [IU]/L (ref 0–32)
AST: 16 [IU]/L (ref 0–40)
Albumin: 4.3 g/dL (ref 3.9–4.9)
Alkaline Phosphatase: 74 [IU]/L (ref 44–121)
BUN/Creatinine Ratio: 17 (ref 12–28)
BUN: 18 mg/dL (ref 8–27)
Bilirubin Total: 0.3 mg/dL (ref 0.0–1.2)
CO2: 23 mmol/L (ref 20–29)
Calcium: 9.9 mg/dL (ref 8.7–10.3)
Chloride: 106 mmol/L (ref 96–106)
Creatinine, Ser: 1.08 mg/dL — ABNORMAL HIGH (ref 0.57–1.00)
Globulin, Total: 2.7 g/dL (ref 1.5–4.5)
Glucose: 104 mg/dL — ABNORMAL HIGH (ref 70–99)
Potassium: 5 mmol/L (ref 3.5–5.2)
Sodium: 142 mmol/L (ref 134–144)
Total Protein: 7 g/dL (ref 6.0–8.5)
eGFR: 57 mL/min/{1.73_m2} — ABNORMAL LOW (ref >59)

## 2023-04-07 ENCOUNTER — Telehealth: Payer: Self-pay

## 2023-04-07 NOTE — Progress Notes (Signed)
 Care Guide Pharmacy Note  04/07/2023 Name: Jackie Ayala MRN: [id number] DOB: 1961-02-24  Referred By: Yevette Hem, FNP Reason for referral: Care Coordination (Outreach to schedule with Pharm d )   Jackie Ayala is a 63 y.o. year old female who is a primary care patient of Yevette Hem, FNP.  Jackie Ayala was referred to the pharmacist for assistance related to: HTN, HLD, and DMII  Successful contact was made with the patient to discuss pharmacy services including being ready for the pharmacist to call at least 5 minutes before the scheduled appointment time and to have medication bottles and any blood pressure readings ready for review. The patient agreed to meet with the pharmacist via telephone visit on (date/time).04/30/2023  Lenton Rail , RMA     [place], [facility] Guide  Direct Dial: [phone]  Website: .[email or web address]

## 2023-04-30 ENCOUNTER — Other Ambulatory Visit: Payer: Medicare Other

## 2023-05-07 ENCOUNTER — Other Ambulatory Visit: Payer: Medicare Other

## 2023-05-09 ENCOUNTER — Other Ambulatory Visit: Payer: Self-pay | Admitting: Family

## 2023-05-09 DIAGNOSIS — E1169 Type 2 diabetes mellitus with other specified complication: Secondary | ICD-10-CM

## 2023-05-18 ENCOUNTER — Ambulatory Visit (INDEPENDENT_AMBULATORY_CARE_PROVIDER_SITE_OTHER): Payer: Medicare Other | Admitting: Pharmacist

## 2023-05-18 DIAGNOSIS — E1169 Type 2 diabetes mellitus with other specified complication: Secondary | ICD-10-CM | POA: Diagnosis not present

## 2023-05-18 DIAGNOSIS — E785 Hyperlipidemia, unspecified: Secondary | ICD-10-CM | POA: Diagnosis not present

## 2023-05-18 NOTE — Progress Notes (Unsigned)
 05/18/2023 Name: Jackie Ayala MRN: [id number] DOB: 01-13-1961  Chief Complaint  Patient presents with   Diabetes   Hyperlipidemia    Jackie Ayala is a 63 y.o. year old female who was referred for medication management by their primary care provider, Jackie Spencer, FNP. They presented for a face to face visit today.   They were referred to the pharmacist by their PCP for assistance in managing diabetes, hypertension, and hyperlipidemia    Subjective:  Care Team: Primary Care Provider: Junie Spencer, FNP ; Next Scheduled Visit: 08/03/2023  Medication Access/Adherence  Current Pharmacy:  [phone] [place] [place] - [address] [place] [address] Phone: [phone] Fax: [phone]   Patient reports affordability concerns with their medications: No  Patient reports access/transportation concerns to their pharmacy: No  Patient reports adherence concerns with their medications:  No     Diabetes:  Current medications: none  Current glucose readings: not checking  Patient denies hypoglycemic s/sx including dizziness, shakiness, sweating. Patient denies hyperglycemic symptoms including polyuria, polydipsia, polyphagia, nocturia, neuropathy, blurred vision.  Current meal patterns:  - Cereal with blueberries and bananas for breakfast - occasional sweets (ice cream) - chicken, pasta - soda, water  Current physical activity: none  Hyperlipidemia/ASCVD Risk Reduction  Current lipid lowering medications: rosuvastatin 5 mg daily   ASCVD History: none Family History: mother- diabetes, HTN; father- heart attack Risk Factors: T2DM, hypertension  Current physical activity: none  The 10-year ASCVD risk score (Arnett DK, et al., 2019) is: 14.9%   Values used to calculate the score:     Age: 32 years     Sex: Female     Is Non-Hispanic African American: No     Diabetic: Yes     Tobacco smoker: No     Systolic Blood  Pressure: 132 mmHg     Is BP treated: Yes     HDL Cholesterol: 49 mg/dL     Total Cholesterol: 185 mg/dL    Objective:  Lab Results  Component Value Date   HGBA1C 5.8 (H) 04/05/2023    Lab Results  Component Value Date   CREATININE 1.08 (H) 04/05/2023   BUN 18 04/05/2023   NA 142 04/05/2023   K 5.0 04/05/2023   CL 106 04/05/2023   CO2 23 04/05/2023    Lab Results  Component Value Date   CHOL 185 05/07/2022   HDL 49 05/07/2022   LDLCALC 101 (H) 05/07/2022   TRIG 203 (H) 05/07/2022   CHOLHDL 3.8 05/07/2022    Medications Reviewed Today     Reviewed by Vela Prose, RPH (Pharmacist) on 05/18/23 at 1156  Med List Status: <None>   Medication Order Taking? Sig Documenting Provider Last Dose Status Informant  ACCU-CHEK GUIDE TEST test strip [id number]  USE TO CHECK SUGAR IN [place], AT NOON, AND AT BEDTIME Jannifer Rodney A, FNP  Active   atenolol (TENORMIN) 50 MG tablet [id number] No TAKE 1 TABLET BY MOUTH EVERY DAY Jannifer Rodney A, FNP Taking Active   Blood Glucose Monitoring Suppl DEVI [id number]  1 each by Does not apply route in the morning, at noon, and at bedtime. May substitute to any manufacturer covered by patient's insurance. Jannifer Rodney A, FNP  Active   lisinopril (ZESTRIL) 20 MG tablet [id number] No Take 1 tablet (20 mg total) by mouth daily. Jackie Spencer, FNP Taking Active   rosuvastatin (CRESTOR) 5 MG tablet [id number] No Take 1 tablet (5 mg total) by  mouth daily. Jackie Spencer, FNP Taking Active               Assessment/Plan:   Diabetes: - Currently controlled based on last A1c 5.8% (A1c 6.5% about 1 year ago). Currently controlled without medications. - Reviewed long term cardiovascular and renal outcomes of uncontrolled blood sugar - Reviewed goal A1c, goal fasting, and goal 2 hour post prandial glucose - Reviewed dietary modifications including my plate method. Extensively counseled on diabetic diet recommendations and provided  educational materials. - Reviewed lifestyle modifications including: increasing physical activity to at least 150 minutes per week - Recommend to check fasting blood glucose every other day. Educated patient on use of Accucheck guide glucometer today.     Hyperlipidemia/ASCVD Risk Reduction: - Currently uncontrolled based on last LDL 101 mg/dL in 09/1060, above goal <69 mg/dL given dx of S8NI and hypertension. Started on rosuvastatin since last lipid panel. - Reviewed long term complications of uncontrolled cholesterol - Recommend to continue rosuvastatin 5 mg daily.  - Will check updated direct LDL today given patient is not fasting   Follow Up Plan: PharmD on 06/22/2023 and PCP on 08/03/2023   Jarrett Ables, PharmD PGY-1 Pharmacy Resident  Kieth Brightly, PharmD, BCACP, CPP Clinical Pharmacist, [facility]

## 2023-05-19 LAB — LDL CHOLESTEROL, DIRECT: LDL Direct: 79 mg/dL (ref 0–99)

## 2023-06-22 ENCOUNTER — Ambulatory Visit: Payer: Medicare Other

## 2023-06-24 ENCOUNTER — Ambulatory Visit: Admitting: Pharmacist

## 2023-06-24 VITALS — BP 127/78 | Wt 207.6 lb

## 2023-06-24 DIAGNOSIS — E1169 Type 2 diabetes mellitus with other specified complication: Secondary | ICD-10-CM | POA: Diagnosis not present

## 2023-06-24 NOTE — Progress Notes (Unsigned)
   06/24/2023 Name: Marquesha Robideau MRN: [id number] DOB: 10-11-1960  No chief complaint on file.   Emanuella Nickle is a 63 y.o. year old female who was referred for medication management by their primary care provider, Yevette Hem, FNP. They presented for a face to face visit today.   They were referred to the pharmacist by their PCP for assistance in managing diabetes    Subjective:  Care Team: Primary Care Provider: Yevette Hem, FNP   Medication Access/Adherence  [facility]  [phone] [place] [place] - [address] [place] [address] Phone: [phone] Fax: [phone]   Patient reports affordability concerns with their medications: No  Patient reports access/transportation concerns to their pharmacy: No  Patient reports adherence concerns with their medications:  No     Diabetes:  Current medications: no medications Medications tried in the past: n/a   Using accuchek GUIDE meter; testing daily, but okay to check 3 times weekly fasting based on stable readings (or if symptomatic 10:48 p 110 12:27pm 108 6:48am 90 8:40am 97  Patient {Actions; denies-reports:120008} hypoglycemic s/sx including ***dizziness, shakiness, sweating. Patient {Actions; denies-reports:120008} hyperglycemic symptoms including ***polyuria, polydipsia, polyphagia, nocturia, neuropathy, blurred vision.  Current meal patterns:  The patient is asked to make an attempt to improve diet and exercise patterns to aid in medical management of this problem. Discussed meal planning options and Plate method for healthy eating Avoid sugary drinks and desserts Incorporate balanced protein, non starchy veggies, 1 serving of carbohydrate with each meal Increase water intake Increase physical activity as able   Current physical activity: ***  Current medication access support: *** 127/78 207.6  Objective:  Lab Results  Component Value Date    HGBA1C 5.8 (H) 04/05/2023    Lab Results  Component Value Date   CREATININE 1.08 (H) 04/05/2023   BUN 18 04/05/2023   NA 142 04/05/2023   K 5.0 04/05/2023   CL 106 04/05/2023   CO2 23 04/05/2023    Lab Results  Component Value Date   CHOL 185 05/07/2022   HDL 49 05/07/2022   LDLCALC 101 (H) 05/07/2022   LDLDIRECT 79 05/18/2023   TRIG 203 (H) 05/07/2022   CHOLHDL 3.8 05/07/2022    Medications Reviewed Today   Medications were not reviewed in this encounter       Assessment/Plan:   {Pharmacy A/P Choices:26421}  Follow Up Plan: ***  ***

## 2023-07-08 ENCOUNTER — Other Ambulatory Visit: Payer: Self-pay | Admitting: Family

## 2023-08-03 ENCOUNTER — Ambulatory Visit: Payer: Medicare Other | Admitting: Family

## 2023-08-03 ENCOUNTER — Ambulatory Visit (INDEPENDENT_AMBULATORY_CARE_PROVIDER_SITE_OTHER)

## 2023-08-03 ENCOUNTER — Encounter: Payer: Self-pay | Admitting: Family

## 2023-08-03 VITALS — BP 138/63 | HR 63 | Temp 99.0°F | Ht 62.0 in | Wt 210.0 lb

## 2023-08-03 DIAGNOSIS — M150 Primary generalized (osteo)arthritis: Secondary | ICD-10-CM | POA: Diagnosis not present

## 2023-08-03 DIAGNOSIS — J209 Acute bronchitis, unspecified: Secondary | ICD-10-CM

## 2023-08-03 DIAGNOSIS — E1169 Type 2 diabetes mellitus with other specified complication: Secondary | ICD-10-CM | POA: Diagnosis not present

## 2023-08-03 DIAGNOSIS — K219 Gastro-esophageal reflux disease without esophagitis: Secondary | ICD-10-CM

## 2023-08-03 DIAGNOSIS — Z0000 Encounter for general adult medical examination without abnormal findings: Secondary | ICD-10-CM

## 2023-08-03 DIAGNOSIS — I10 Essential (primary) hypertension: Secondary | ICD-10-CM

## 2023-08-03 DIAGNOSIS — E785 Hyperlipidemia, unspecified: Secondary | ICD-10-CM

## 2023-08-03 DIAGNOSIS — Z6838 Body mass index (BMI) 38.0-38.9, adult: Secondary | ICD-10-CM

## 2023-08-03 DIAGNOSIS — Z0001 Encounter for general adult medical examination with abnormal findings: Secondary | ICD-10-CM | POA: Diagnosis not present

## 2023-08-03 LAB — BAYER DCA HB A1C WAIVED: HB A1C (BAYER DCA - WAIVED): 5.8 % — ABNORMAL HIGH (ref 4.8–5.6)

## 2023-08-03 LAB — LIPID PANEL

## 2023-08-03 MED ORDER — FLUTICASONE PROPIONATE 50 MCG/ACT NA SUSP: 2.0000 | Freq: Every day | NASAL | 6 refills | Status: DC

## 2023-08-03 MED ORDER — CETIRIZINE HCL 10 MG PO TABS: 10.0000 mg | ORAL_TABLET | Freq: Every day | ORAL | 1 refills | Status: DC

## 2023-08-03 MED ORDER — PREDNISONE 10 MG (21) PO TBPK: ORAL_TABLET | ORAL | 0 refills | Status: DC

## 2023-08-03 NOTE — Patient Instructions (Signed)

## 2023-08-03 NOTE — Progress Notes (Signed)
 Subjective:    Patient ID: Jackie Ayala, female    DOB: 1960-09-02, 63 y.o.   MRN: [id number]  Chief Complaint  Patient presents with   Medical Management of Chronic Issues   Cough    For 2 mths    Pt presents to the office today for CPE and chronic follow up.    She is morbid obese with a BMI of 38 and HTN and Hyperlipemia.    She is the main caregiver for her husband.  Hypertension This is a chronic problem. The current episode started more than 1 year ago. The problem has been resolved since onset. The problem is controlled. Associated symptoms include blurred vision, headaches and malaise/fatigue. Pertinent negatives include no peripheral edema or shortness of breath. Risk factors for coronary artery disease include dyslipidemia, diabetes mellitus, obesity and sedentary lifestyle. The current treatment provides moderate improvement.  Gastroesophageal Reflux She complains of belching, coughing and heartburn. She reports no sore throat or no wheezing. This is a chronic problem. The current episode started more than 1 year ago. The problem occurs occasionally. The symptoms are aggravated by bending and certain foods. Risk factors include obesity. She has tried an antacid and a diet change for the symptoms. The treatment provided moderate relief.  Hyperlipidemia This is a chronic problem. The current episode started more than 1 year ago. The problem is controlled. Recent lipid tests were reviewed and are normal. Exacerbating diseases include obesity. Pertinent negatives include no shortness of breath. Current antihyperlipidemic treatment includes statins. The current treatment provides mild improvement of lipids. Risk factors for coronary artery disease include dyslipidemia, diabetes mellitus, hypertension, a sedentary lifestyle and post-menopausal.  Diabetes She presents for her follow-up diabetic visit. She has type 2 diabetes mellitus. Hypoglycemia symptoms include headaches. Associated  symptoms include blurred vision. Pertinent negatives for diabetes include no foot paresthesias. Symptoms are stable. Risk factors for coronary artery disease include dyslipidemia, diabetes mellitus, hypertension, sedentary lifestyle and post-menopausal. She is following a generally healthy diet. Her overall blood glucose range is 110-130 mg/dl. (Does not check glucose at home regularly ) Eye exam is current.  Arthritis Presents for follow-up visit. She complains of pain and stiffness. Affected locations include the right hip and left shoulder. Her pain is at a severity of 3/10. Pertinent negatives include no fever.  Cough This is a new problem. The current episode started more than 1 month ago. The problem has been waxing and waning. The problem occurs every few minutes. The cough is Productive of sputum. Associated symptoms include headaches, heartburn, nasal congestion, postnasal drip and rhinorrhea. Pertinent negatives include no ear congestion, ear pain, fever, sore throat, shortness of breath or wheezing. She has tried rest for the symptoms. The treatment provided no relief.      Review of Systems  Constitutional:  Positive for malaise/fatigue. Negative for fever.  HENT:  Positive for postnasal drip and rhinorrhea. Negative for ear pain and sore throat.   Eyes:  Positive for blurred vision.  Respiratory:  Positive for cough. Negative for shortness of breath and wheezing.   Gastrointestinal:  Positive for heartburn.  Musculoskeletal:  Positive for stiffness.  Neurological:  Positive for headaches.  All other systems reviewed and are negative.  Family History  Problem Relation Age of Onset   Diabetes Mother    Breast cancer Mother 31   COPD Mother    Hypertension Mother    Heart disease Father        Heart attack   Social History  Socioeconomic History   Marital status: Married    Spouse name: Not on file   Number of children: Not on file   Years of education: Not on file    Highest education level: Not on file  Occupational History   Not on file  Tobacco Use   Smoking status: Never   Smokeless tobacco: Never  Vaping Use   Vaping status: Never Used  Substance and Sexual Activity   Alcohol use: No   Drug use: No   Sexual activity: Not Currently    Birth control/protection: Surgical    Comment: Hyst/ovaries remain  Other Topics Concern   Not on file  Social History Narrative   Not on file   Social Drivers of Health   Financial Resource Strain: Not on file  Food Insecurity: No Food Insecurity (08/03/2023)   Hunger Vital Sign    Worried About Running Out of Food in the Last Year: Never true    Ran Out of Food in the Last Year: Never true  Transportation Needs: Not on file  Physical Activity: Not on file  Stress: Not on file  Social Connections: Not on file       Objective:   Physical Exam Vitals reviewed.  Constitutional:      General: She is not in acute distress.    Appearance: She is well-developed. She is obese.  HENT:     Head: Normocephalic and atraumatic.     Right Ear: Tympanic membrane normal.     Left Ear: Tympanic membrane normal.  Eyes:     Pupils: Pupils are equal, round, and reactive to light.  Neck:     Thyroid : No thyromegaly.  Cardiovascular:     Rate and Rhythm: Normal rate and regular rhythm.     Heart sounds: Normal heart sounds. No murmur heard. Pulmonary:     Effort: Pulmonary effort is normal. No respiratory distress.     Breath sounds: Normal breath sounds. No wheezing.  Abdominal:     General: Bowel sounds are normal. There is no distension.     Palpations: Abdomen is soft.     Tenderness: There is no abdominal tenderness.  Musculoskeletal:        General: No tenderness. Normal range of motion.     Cervical back: Normal range of motion and neck supple.     Right lower leg: No edema.     Left lower leg: Edema (trace) present.  Skin:    General: Skin is warm and dry.  Neurological:     Mental Status: She  is alert and oriented to person, place, and time.     Cranial Nerves: No cranial nerve deficit.     Deep Tendon Reflexes: Reflexes are normal and symmetric.  Psychiatric:        Behavior: Behavior normal.        Thought Content: Thought content normal.        Judgment: Judgment normal.       BP 138/63   Pulse 63   Temp 99 F (37.2 C) (Temporal)   Ht 5\' 2"  (1.575 m)   Wt 210 lb (95.3 kg)   SpO2 94%   BMI 38.41 kg/m      Assessment & Plan:   Jackie Ayala comes in today with chief complaint of Medical Management of Chronic Issues and Cough (For 2 mths )   Diagnosis and orders addressed:  1. Annual physical exam (Primary) - DG [facility] DEXA; Future - CMP14+EGFR - CBC with Differential/Platelet -  Lipid panel - Bayer DCA Hb A1c Waived - TSH  2. Essential hypertension - CMP14+EGFR - CBC with Differential/Platelet - TSH  3. Gastroesophageal reflux disease, unspecified whether esophagitis present - CMP14+EGFR - CBC with Differential/Platelet  4. Morbid obesity (HCC) - CMP14+EGFR - CBC with Differential/Platelet  5. Type 2 diabetes mellitus with other specified complication, without long-term current use of insulin (HCC) - CMP14+EGFR - CBC with Differential/Platelet - Bayer DCA Hb A1c Waived  6. Hyperlipidemia associated with type 2 diabetes mellitus (HCC) - CMP14+EGFR - CBC with Differential/Platelet - Lipid panel  7. Primary osteoarthritis involving multiple joints  - CMP14+EGFR - CBC with Differential/Platelet  8. Acute bronchitis, unspecified organism Start prednisone , zyrtec , and flonase  If cough continues need to change lisinopril    - cetirizine  (ZYRTEC  ALLERGY) 10 MG tablet; Take 1 tablet (10 mg total) by mouth daily.  Dispense: 90 tablet; Refill: 1 - fluticasone  (FLONASE ) 50 MCG/ACT nasal spray; Place 2 sprays into both nostrils daily.  Dispense: 16 g; Refill: 6 - predniSONE  (STERAPRED UNI-PAK 21 TAB) 10 MG (21) TBPK tablet; Use as directed   Dispense: 21 tablet; Refill: 0 - CMP14+EGFR - CBC with Differential/Platelet    Labs pending Dash diet information given Exercise encouraged Stress Management  Continue current meds Health Maintenance reviewed Diet and exercise encouraged  Follow up plan: 3 months   Tommas Fragmin, FNP

## 2023-08-04 DIAGNOSIS — Z780 Asymptomatic menopausal state: Secondary | ICD-10-CM | POA: Diagnosis not present

## 2023-08-04 LAB — CMP14+EGFR
ALT: 21 IU/L (ref 0–32)
AST: 20 IU/L (ref 0–40)
Albumin: 4 g/dL (ref 3.9–4.9)
Alkaline Phosphatase: 86 IU/L (ref 44–121)
BUN/Creatinine Ratio: 17 (ref 12–28)
BUN: 17 mg/dL (ref 8–27)
Bilirubin Total: 0.3 mg/dL (ref 0.0–1.2)
CO2: 22 mmol/L (ref 20–29)
Calcium: 10.1 mg/dL (ref 8.7–10.3)
Chloride: 103 mmol/L (ref 96–106)
Creatinine, Ser: 1.03 mg/dL — ABNORMAL HIGH (ref 0.57–1.00)
Globulin, Total: 3.2 g/dL (ref 1.5–4.5)
Glucose: 115 mg/dL — ABNORMAL HIGH (ref 70–99)
Potassium: 4.9 mmol/L (ref 3.5–5.2)
Sodium: 140 mmol/L (ref 134–144)
Total Protein: 7.2 g/dL (ref 6.0–8.5)
eGFR: 60 mL/min/{1.73_m2} (ref >59)

## 2023-08-04 LAB — LIPID PANEL
Cholesterol, Total: 165 mg/dL (ref 100–199)
HDL: 54 mg/dL (ref >39)
LDL CALC COMMENT:: 3.1 ratio (ref 0.0–4.4)
LDL Chol Calc (NIH): 82 mg/dL (ref 0–99)
Triglycerides: 171 mg/dL — ABNORMAL HIGH (ref 0–149)
VLDL Cholesterol Cal: 29 mg/dL (ref 5–40)

## 2023-08-04 LAB — CBC WITH DIFFERENTIAL/PLATELET
Basophils Absolute: 0.1 10*3/uL (ref 0.0–0.2)
Basos: 1 %
EOS (ABSOLUTE): 0.1 10*3/uL (ref 0.0–0.4)
Eos: 2 %
Hematocrit: 38.1 % (ref 34.0–46.6)
Hemoglobin: 13.1 g/dL (ref 11.1–15.9)
Immature Grans (Abs): 0 10*3/uL (ref 0.0–0.1)
Immature Granulocytes: 0 %
Lymphocytes Absolute: 2.1 10*3/uL (ref 0.7–3.1)
Lymphs: 39 %
MCH: 31.9 pg (ref 26.6–33.0)
MCHC: 34.4 g/dL (ref 31.5–35.7)
MCV: 93 fL (ref 79–97)
Monocytes Absolute: 0.4 10*3/uL (ref 0.1–0.9)
Monocytes: 8 %
Neutrophils Absolute: 2.6 10*3/uL (ref 1.4–7.0)
Neutrophils: 50 %
Platelets: 234 10*3/uL (ref 150–450)
RBC: 4.11 x10E6/uL (ref 3.77–5.28)
RDW: 12.5 % (ref 11.7–15.4)
WBC: 5.2 10*3/uL (ref 3.4–10.8)

## 2023-08-04 LAB — TSH: TSH: 2.59 u[IU]/mL (ref 0.450–4.500)

## 2023-08-05 ENCOUNTER — Telehealth: Payer: Self-pay

## 2023-08-05 ENCOUNTER — Ambulatory Visit: Payer: Self-pay | Admitting: Family

## 2023-08-05 NOTE — Telephone Encounter (Signed)
 Refer to labs

## 2023-08-05 NOTE — Telephone Encounter (Signed)
 Copied from CRM [id number]. Topic: Clinical - Lab/Test Results >> Aug 05, 2023  3:32 PM Jackie Ayala F wrote: Reason for CRM:   Patient called in to the office after missing a call from the clinical staff; Agent relayed lab results to the patient and she has no questions at this time.   Callback Number: [phone]

## 2023-11-19 ENCOUNTER — Ambulatory Visit (INDEPENDENT_AMBULATORY_CARE_PROVIDER_SITE_OTHER): Admitting: Family Medicine

## 2023-11-19 ENCOUNTER — Encounter: Payer: Self-pay | Admitting: Family Medicine

## 2023-11-19 VITALS — BP 158/84 | HR 60 | Temp 98.9°F | Ht 62.0 in | Wt 213.0 lb

## 2023-11-19 DIAGNOSIS — R0982 Postnasal drip: Secondary | ICD-10-CM

## 2023-11-19 DIAGNOSIS — R053 Chronic cough: Secondary | ICD-10-CM | POA: Diagnosis not present

## 2023-11-19 DIAGNOSIS — K219 Gastro-esophageal reflux disease without esophagitis: Secondary | ICD-10-CM | POA: Diagnosis not present

## 2023-11-19 MED ORDER — MONTELUKAST SODIUM 10 MG PO TABS
10.0000 mg | ORAL_TABLET | Freq: Every day | ORAL | 3 refills | Status: DC
Start: 2023-11-19 — End: 2024-02-04

## 2023-11-19 MED ORDER — OMEPRAZOLE 20 MG PO CPDR: 20.0000 mg | DELAYED_RELEASE_CAPSULE | Freq: Every day | ORAL | 3 refills | Status: DC

## 2023-11-19 MED ORDER — BENZONATATE 200 MG PO CAPS: 200.0000 mg | ORAL_CAPSULE | Freq: Two times a day (BID) | ORAL | 0 refills | Status: DC | PRN

## 2023-11-19 NOTE — Progress Notes (Signed)
 Subjective:  Patient ID: Jackie Ayala, female    DOB: October 14, 1960, 63 y.o.   MRN: [id number]  Patient Care Team: Lavell Bari LABOR, FNP as PCP - General (Nurse Practitioner)   Chief Complaint:  Cough (Drainage, mucus/No fever, no sore throat, no headache)   HPI: Jackie Ayala is a 63 y.o. female presenting on 11/19/2023 for Cough (Drainage, mucus/No fever, no sore throat, no headache)   Jackie Ayala is a 63 year old female who presents with a persistent cough.  She has experienced a persistent cough since her last visit in May, which has worsened significantly over the past month. The cough is sometimes productive with small amounts of mucus but often feels dry and itchy. It is more pronounced at night, causing her to sleep sitting up to avoid coughing when lying flat.  She has been using Zyrtec  intermittently and recently restarted Flonase  due to worsening symptoms, although she finds the nasal spray unpleasant and feels it increases drainage. She has a history of acid reflux and has used over-the-counter omeprazole  intermittently. Her reflux symptoms have been on and off.  She has been on lisinopril  for quite some time. No earaches, fever, shortness of breath, chest pain, or swelling. She mentions a little earwax.          Relevant past medical, surgical, family, and social history reviewed and updated as indicated.  Allergies and medications reviewed and updated. Data reviewed: Chart in Epic.   Past Medical History:  Diagnosis Date   Hypertension     Past Surgical History:  Procedure Laterality Date   ABDOMINAL HYSTERECTOMY  2004   ovaries remain   CHOLECYSTECTOMY      Social History   Socioeconomic History   Marital status: Married    Spouse name: Not on file   Number of children: Not on file   Years of education: Not on file   Highest education level: Not on file  Occupational History   Not on file  Tobacco Use   Smoking status: Never   Smokeless  tobacco: Never  Vaping Use   Vaping status: Never Used  Substance and Sexual Activity   Alcohol use: No   Drug use: No   Sexual activity: Not Currently    Birth control/protection: Surgical    Comment: Hyst/ovaries remain  Other Topics Concern   Not on file  Social History Narrative   Not on file   Social Drivers of Health   Financial Resource Strain: Not on file  Food Insecurity: No Food Insecurity (08/03/2023)   Hunger Vital Sign    Worried About Running Out of Food in the Last Year: Never true    Ran Out of Food in the Last Year: Never true  Transportation Needs: Not on file  Physical Activity: Not on file  Stress: Not on file  Social Connections: Not on file  Intimate Partner Violence: Not on file    Outpatient Encounter Medications as of 11/19/2023  Medication Sig   ACCU-CHEK GUIDE TEST test strip USE TO CHECK SUGAR IN MORNING, AT NOON, AND AT BEDTIME   atenolol  (TENORMIN ) 50 MG tablet TAKE 1 TABLET BY MOUTH EVERY DAY   benzonatate  (TESSALON ) 200 MG capsule Take 1 capsule (200 mg total) by mouth 2 (two) times daily as needed for cough.   Blood Glucose Monitoring Suppl DEVI 1 each by Does not apply route in the morning, at noon, and at bedtime. May substitute to any manufacturer covered by patient's insurance.   cetirizine  (  ZYRTEC  ALLERGY) 10 MG tablet Take 1 tablet (10 mg total) by mouth daily.   cholecalciferol (VITAMIN D3) 25 MCG (1000 UNIT) tablet Take 1,000 Units by mouth daily.   fluticasone  (FLONASE ) 50 MCG/ACT nasal spray Place 2 sprays into both nostrils daily.   lisinopril  (ZESTRIL ) 20 MG tablet Take 1 tablet (20 mg total) by mouth daily.   montelukast  (SINGULAIR ) 10 MG tablet Take 1 tablet (10 mg total) by mouth at bedtime.   Omega-3 Fatty Acids (FISH OIL) 1000 MG CAPS Take 1 capsule by mouth.   omeprazole  (PRILOSEC) 20 MG capsule Take 1 capsule (20 mg total) by mouth daily.   rosuvastatin  (CRESTOR ) 5 MG tablet Take 1 tablet (5 mg total) by mouth daily.   zinc  gluconate 50 MG tablet Take 50 mg by mouth daily.   [DISCONTINUED] predniSONE  (STERAPRED UNI-PAK 21 TAB) 10 MG (21) TBPK tablet Use as directed   No facility-administered encounter medications on file as of 11/19/2023.    Allergies  Allergen Reactions   Other Rash   Penicillins    Sulfa Antibiotics     Pertinent ROS per HPI, otherwise unremarkable      Objective:  BP (!) 158/84   Pulse 60   Temp 98.9 F (37.2 C)   Ht 5' 2 (1.575 m)   Wt 213 lb (96.6 kg)   SpO2 96%   BMI 38.96 kg/m    Wt Readings from Last 3 Encounters:  11/19/23 213 lb (96.6 kg)  08/03/23 210 lb (95.3 kg)  07/07/23 207 lb 9.6 oz (94.2 kg)    Physical Exam Vitals and nursing note reviewed.  Constitutional:      General: She is not in acute distress.    Appearance: Normal appearance. She is obese. She is not ill-appearing, toxic-appearing or diaphoretic.  HENT:     Head: Normocephalic and atraumatic.     Right Ear: Tympanic membrane, ear canal and external ear normal.     Left Ear: Tympanic membrane, ear canal and external ear normal.     Nose: Nose normal.     Mouth/Throat:     Mouth: Mucous membranes are moist.     Pharynx: Oropharynx is clear. Postnasal drip present. No posterior oropharyngeal erythema.     Comments: Cobblestoning of posterior oropharynx  Eyes:     Conjunctiva/sclera: Conjunctivae normal.     Pupils: Pupils are equal, round, and reactive to light.  Cardiovascular:     Rate and Rhythm: Normal rate and regular rhythm.     Heart sounds: Normal heart sounds.  Pulmonary:     Effort: Pulmonary effort is normal.     Breath sounds: Normal breath sounds.     Comments: Hacking cough Lymphadenopathy:     Cervical: No cervical adenopathy.  Skin:    General: Skin is warm and dry.     Capillary Refill: Capillary refill takes less than 2 seconds.  Neurological:     General: No focal deficit present.     Mental Status: She is alert and oriented to person, place, and time.   Psychiatric:        Mood and Affect: Mood normal.        Behavior: Behavior normal.        Thought Content: Thought content normal.        Judgment: Judgment normal.      Results for orders placed or performed in visit on 08/03/23  Bayer DCA Hb A1c Waived   Collection Time: 08/03/23  9:38 AM  Result Value Ref Range   HB A1C (BAYER DCA - WAIVED) 5.8 (H) 4.8 - 5.6 %  CMP14+EGFR   Collection Time: 08/03/23  9:44 AM  Result Value Ref Range   Glucose 115 (H) 70 - 99 mg/dL   BUN 17 8 - 27 mg/dL   Creatinine, Ser 8.96 (H) 0.57 - 1.00 mg/dL   eGFR 60 >40 fO/fpw/8.26   BUN/Creatinine Ratio 17 12 - 28   Sodium 140 134 - 144 mmol/L   Potassium 4.9 3.5 - 5.2 mmol/L   Chloride 103 96 - 106 mmol/L   CO2 22 20 - 29 mmol/L   Calcium  10.1 8.7 - 10.3 mg/dL   Total Protein 7.2 6.0 - 8.5 g/dL   Albumin 4.0 3.9 - 4.9 g/dL   Globulin, Total 3.2 1.5 - 4.5 g/dL   Bilirubin Total 0.3 0.0 - 1.2 mg/dL   Alkaline Phosphatase 86 44 - 121 IU/L   AST 20 0 - 40 IU/L   ALT 21 0 - 32 IU/L  CBC with Differential/Platelet   Collection Time: 08/03/23  9:44 AM  Result Value Ref Range   WBC 5.2 3.4 - 10.8 x10E3/uL   RBC 4.11 3.77 - 5.28 x10E6/uL   Hemoglobin 13.1 11.1 - 15.9 g/dL   Hematocrit 61.8 65.9 - 46.6 %   MCV 93 79 - 97 fL   MCH 31.9 26.6 - 33.0 pg   MCHC 34.4 31.5 - 35.7 g/dL   RDW 87.4 88.2 - 84.5 %   Platelets 234 150 - 450 x10E3/uL   Neutrophils 50 Not Estab. %   Lymphs 39 Not Estab. %   Monocytes 8 Not Estab. %   Eos 2 Not Estab. %   Basos 1 Not Estab. %   Neutrophils Absolute 2.6 1.4 - 7.0 x10E3/uL   Lymphocytes Absolute 2.1 0.7 - 3.1 x10E3/uL   Monocytes Absolute 0.4 0.1 - 0.9 x10E3/uL   EOS (ABSOLUTE) 0.1 0.0 - 0.4 x10E3/uL   Basophils Absolute 0.1 0.0 - 0.2 x10E3/uL   Immature Granulocytes 0 Not Estab. %   Immature Grans (Abs) 0.0 0.0 - 0.1 x10E3/uL  Lipid panel   Collection Time: 08/03/23  9:44 AM  Result Value Ref Range   Cholesterol, Total 165 100 - 199 mg/dL    Triglycerides 828 (H) 0 - 149 mg/dL   HDL 54 >60 mg/dL   VLDL Cholesterol Cal 29 5 - 40 mg/dL   LDL Chol Calc (NIH) 82 0 - 99 mg/dL   Chol/HDL Ratio 3.1 0.0 - 4.4 ratio  TSH   Collection Time: 08/03/23  9:44 AM  Result Value Ref Range   TSH 2.590 0.450 - 4.500 uIU/mL       Pertinent labs & imaging results that were available during my care of the patient were reviewed by me and considered in my medical decision making.  Assessment & Plan:  Kennidee was seen today for cough.  Diagnoses and all orders for this visit:  Chronic cough -     montelukast  (SINGULAIR ) 10 MG tablet; Take 1 tablet (10 mg total) by mouth at bedtime. -     benzonatate  (TESSALON ) 200 MG capsule; Take 1 capsule (200 mg total) by mouth 2 (two) times daily as needed for cough.  Post-nasal drainage -     montelukast  (SINGULAIR ) 10 MG tablet; Take 1 tablet (10 mg total) by mouth at bedtime.  Gastroesophageal reflux disease without esophagitis -     omeprazole  (PRILOSEC) 20 MG capsule; Take 1 capsule (20 mg total) by  mouth daily.       Chronic cough due to postnasal drip and gastroesophageal reflux disease Chronic cough persisting since May, exacerbated in the last month, likely due to postnasal drip and uncontrolled GERD. Symptoms include dry, itchy throat, and cough worsens when lying flat. Differential diagnosis includes thyroid  problems, lisinopril -induced cough, and uncontrolled reflux. Examination reveals cobblestoning indicative of postnasal drip. No fever, shortness of breath, or chest pain reported. Omeprazole  previously used intermittently for reflux. - Prescribe omeprazole  daily for at least six weeks, then taper off to prevent rebound reflux. - Prescribe Singulair  (montelukast ) to be taken nightly for at least two weeks to address chronic cough and postnasal drip. Discuss potential side effects including dry mouth, dry skin, headache, and dizziness. - Prescribe Tessalon  as needed for persistent coughing. -  Schedule follow-up appointment with Christy in six to eight weeks to assess treatment efficacy.          Continue all other maintenance medications.  Follow up plan: Return in about 8 weeks (around 01/14/2024).   Continue healthy lifestyle choices, including diet (rich in fruits, vegetables, and lean proteins, and low in salt and simple carbohydrates) and exercise (at least 30 minutes of moderate physical activity daily).  Educational handout given for cough  The above assessment and management plan was discussed with the patient. The patient verbalized understanding of and has agreed to the management plan. Patient is aware to call the clinic if they develop any new symptoms or if symptoms persist or worsen. Patient is aware when to return to the clinic for a follow-up visit. Patient educated on when it is appropriate to go to the emergency department.   Rosaline Bruns, FNP-C [facility] [phone]

## 2023-12-13 LAB — OPHTHALMOLOGY REPORT-SCANNED

## 2024-01-11 NOTE — Progress Notes (Signed)
 Jackie Ayala                                          MRN: [id number]   01/11/2024   The [facility] Quality Team Specialist reviewed this patient medical record for the purposes of chart review for care gap closure. The following were reviewed: abstraction for care gap closure-glycemic status assessment.    [facility] Quality Team

## 2024-01-11 NOTE — Progress Notes (Signed)
 Jackie Ayala                                          MRN: [id number]   01/11/2024   The [facility] Quality Team Specialist reviewed this patient medical record for the purposes of chart review for care gap closure. The following were reviewed: chart review for care gap closure-kidney health evaluation for diabetes:eGFR  and uACR.    [facility] Quality Team

## 2024-01-17 ENCOUNTER — Ambulatory Visit: Admitting: Family

## 2024-01-17 ENCOUNTER — Encounter: Payer: Self-pay | Admitting: Family

## 2024-01-29 ENCOUNTER — Other Ambulatory Visit: Payer: Self-pay | Admitting: *Deleted

## 2024-01-29 DIAGNOSIS — J209 Acute bronchitis, unspecified: Secondary | ICD-10-CM

## 2024-02-04 ENCOUNTER — Ambulatory Visit: Payer: Self-pay | Admitting: Family

## 2024-02-04 ENCOUNTER — Encounter: Payer: Self-pay | Admitting: Family

## 2024-02-04 VITALS — BP 125/76 | Ht 62.0 in | Wt 213.2 lb

## 2024-02-04 DIAGNOSIS — E785 Hyperlipidemia, unspecified: Secondary | ICD-10-CM | POA: Diagnosis not present

## 2024-02-04 DIAGNOSIS — I10 Essential (primary) hypertension: Secondary | ICD-10-CM | POA: Diagnosis not present

## 2024-02-04 DIAGNOSIS — K219 Gastro-esophageal reflux disease without esophagitis: Secondary | ICD-10-CM

## 2024-02-04 DIAGNOSIS — E1169 Type 2 diabetes mellitus with other specified complication: Secondary | ICD-10-CM | POA: Diagnosis not present

## 2024-02-04 DIAGNOSIS — M150 Primary generalized (osteo)arthritis: Secondary | ICD-10-CM | POA: Diagnosis not present

## 2024-02-04 LAB — CMP14+EGFR
ALT: 23 IU/L (ref 0–32)
AST: 20 IU/L (ref 0–40)
Albumin: 4.1 g/dL (ref 3.9–4.9)
Alkaline Phosphatase: 76 IU/L (ref 49–135)
BUN/Creatinine Ratio: 15 (ref 12–28)
BUN: 16 mg/dL (ref 8–27)
Bilirubin Total: 0.5 mg/dL (ref 0.0–1.2)
CO2: 25 mmol/L (ref 20–29)
Calcium: 9.9 mg/dL (ref 8.7–10.3)
Chloride: 103 mmol/L (ref 96–106)
Creatinine, Ser: 1.08 mg/dL — ABNORMAL HIGH (ref 0.57–1.00)
Globulin, Total: 2.9 g/dL (ref 1.5–4.5)
Glucose: 112 mg/dL — ABNORMAL HIGH (ref 70–99)
Potassium: 4.4 mmol/L (ref 3.5–5.2)
Sodium: 140 mmol/L (ref 134–144)
Total Protein: 7 g/dL (ref 6.0–8.5)
eGFR: 57 mL/min/1.73 — ABNORMAL LOW (ref >59)

## 2024-02-04 LAB — BAYER DCA HB A1C WAIVED: HB A1C (BAYER DCA - WAIVED): 5.8 % — ABNORMAL HIGH (ref 4.8–5.6)

## 2024-02-04 MED ORDER — ROSUVASTATIN CALCIUM 5 MG PO TABS: 5.0000 mg | ORAL_TABLET | Freq: Every day | ORAL | 3 refills | Status: AC

## 2024-02-04 MED ORDER — LISINOPRIL 20 MG PO TABS: 20.0000 mg | ORAL_TABLET | Freq: Every day | ORAL | 3 refills | Status: AC

## 2024-02-04 MED ORDER — ATENOLOL 50 MG PO TABS: ORAL_TABLET | ORAL | 3 refills | Status: AC

## 2024-02-04 NOTE — Progress Notes (Signed)
 Subjective:    Patient ID: Jackie Ayala, female    DOB: 09-24-1960, 63 y.o.   MRN: [id number]  Chief Complaint  Patient presents with   Medical Management of Chronic Issues   Pt presents to the office today for chronic follow up.    She is morbid obese with a BMI of 38 and HTN and Hyperlipemia.    She is the main caregiver for her husband.  Hypertension This is a chronic problem. The current episode started more than 1 year ago. The problem has been waxing and waning since onset. The problem is uncontrolled. Associated symptoms include malaise/fatigue. Pertinent negatives include no blurred vision or peripheral edema. Risk factors for coronary artery disease include dyslipidemia, diabetes mellitus, obesity, sedentary lifestyle and post-menopausal state. The current treatment provides moderate improvement.  Gastroesophageal Reflux She complains of belching. This is a chronic problem. The current episode started more than 1 year ago. The problem occurs occasionally. The symptoms are aggravated by bending and certain foods. Risk factors include obesity. She has tried an antacid, a diet change and a PPI for the symptoms. The treatment provided moderate relief.  Hyperlipidemia This is a chronic problem. The current episode started more than 1 year ago. The problem is controlled. Recent lipid tests were reviewed and are normal. Exacerbating diseases include obesity. Current antihyperlipidemic treatment includes statins. The current treatment provides moderate improvement of lipids. Risk factors for coronary artery disease include dyslipidemia, diabetes mellitus, hypertension, a sedentary lifestyle, post-menopausal and obesity.  Diabetes She presents for her follow-up diabetic visit. She has type 2 diabetes mellitus. Pertinent negatives for diabetes include no blurred vision and no foot paresthesias. Symptoms are stable. Risk factors for coronary artery disease include dyslipidemia, diabetes  mellitus, hypertension, sedentary lifestyle and post-menopausal. She is following a generally healthy diet. (Does not check glucose at home regularly ) Eye exam is current.  Arthritis Presents for follow-up visit. She complains of pain and stiffness. Affected locations include the right hip and left shoulder. Her pain is at a severity of 2/10.      Review of Systems  Constitutional:  Positive for malaise/fatigue.  Eyes:  Negative for blurred vision.  Musculoskeletal:  Positive for stiffness.  All other systems reviewed and are negative.  Family History  Problem Relation Age of Onset   Diabetes Mother    Breast cancer Mother 71   COPD Mother    Hypertension Mother    Heart disease Father        Heart attack   Social History   Socioeconomic History   Marital status: Married    Spouse name: Not on file   Number of children: Not on file   Years of education: Not on file   Highest education level: Not on file  Occupational History   Not on file  Tobacco Use   Smoking status: Never   Smokeless tobacco: Never  Vaping Use   Vaping status: Never Used  Substance and Sexual Activity   Alcohol use: No   Drug use: No   Sexual activity: Not Currently    Birth control/protection: Surgical    Comment: Hyst/ovaries remain  Other Topics Concern   Not on file  Social History Narrative   Not on file   Social Drivers of Health   Financial Resource Strain: Not on file  Food Insecurity: No Food Insecurity (08/03/2023)   Hunger Vital Sign    Worried About Running Out of Food in the Last Year: Never true  Ran Out of Food in the Last Year: Never true  Transportation Needs: Not on file  Physical Activity: Not on file  Stress: Not on file  Social Connections: Not on file       Objective:   Physical Exam Vitals reviewed.  Constitutional:      General: She is not in acute distress.    Appearance: She is well-developed. She is obese.  HENT:     Head: Normocephalic and  atraumatic.     Right Ear: Tympanic membrane normal.     Left Ear: Tympanic membrane normal.  Eyes:     Pupils: Pupils are equal, round, and reactive to light.  Neck:     Thyroid : No thyromegaly.  Cardiovascular:     Rate and Rhythm: Normal rate and regular rhythm.     Heart sounds: Normal heart sounds. No murmur heard. Pulmonary:     Effort: Pulmonary effort is normal. No respiratory distress.     Breath sounds: Normal breath sounds. No wheezing.  Abdominal:     General: Bowel sounds are normal. There is no distension.     Palpations: Abdomen is soft.     Tenderness: There is no abdominal tenderness.  Musculoskeletal:        General: No tenderness. Normal range of motion.     Cervical back: Normal range of motion and neck supple.     Right lower leg: No edema.     Left lower leg: Edema (trace) present.  Skin:    General: Skin is warm and dry.  Neurological:     Mental Status: She is alert and oriented to person, place, and time.     Cranial Nerves: No cranial nerve deficit.     Deep Tendon Reflexes: Reflexes are normal and symmetric.  Psychiatric:        Behavior: Behavior normal.        Thought Content: Thought content normal.        Judgment: Judgment normal.       BP (!) 151/75   Ht 5' 2 (1.575 m)   Wt 213 lb 3.2 oz (96.7 kg)   BMI 38.99 kg/m      Assessment & Plan:   Jackie Ayala comes in today with chief complaint of Medical Management of Chronic Issues   Diagnosis and orders addressed:  1. Essential hypertension - atenolol  (TENORMIN ) 50 MG tablet; TAKE 1 TABLET BY MOUTH EVERY DAY  Dispense: 90 tablet; Refill: 3 - lisinopril  (ZESTRIL ) 20 MG tablet; Take 1 tablet (20 mg total) by mouth daily.  Dispense: 90 tablet; Refill: 3 - CMP14+EGFR  2. Hyperlipidemia, unspecified hyperlipidemia type - rosuvastatin  (CRESTOR ) 5 MG tablet; Take 1 tablet (5 mg total) by mouth daily.  Dispense: 90 tablet; Refill: 3 - CMP14+EGFR  3. Primary osteoarthritis involving  multiple joints - CMP14+EGFR  4. Morbid obesity (HCC)  - CMP14+EGFR  5. Hyperlipidemia associated with type 2 diabetes mellitus (HCC) - CMP14+EGFR  6. Gastroesophageal reflux disease without esophagitis - CMP14+EGFR  7. Type 2 diabetes mellitus with other specified complication, without long-term current use of insulin (HCC) (Primary) - Bayer DCA Hb A1c Waived - CMP14+EGFR - Microalbumin / creatinine urine ratio    Labs pending Exercise encouraged Stress Management  Continue current meds Health Maintenance reviewed Diet and exercise encouraged  Follow up plan: 3 months   Bari Learn, FNP

## 2024-02-04 NOTE — Patient Instructions (Signed)
 Health Maintenance After Age 65 After age 27, you are at a higher risk for certain long-term diseases and infections as well as injuries from falls. Falls are a major cause of broken bones and head injuries in people who are older than age 73. Getting regular preventive care can help to keep you healthy and well. Preventive care includes getting regular testing and making lifestyle changes as recommended by your health care provider. Talk with your health care provider about: Which screenings and tests you should have. A screening is a test that checks for a disease when you have no symptoms. A diet and exercise plan that is right for you. What should I know about screenings and tests to prevent falls? Screening and testing are the best ways to find a health problem early. Early diagnosis and treatment give you the best chance of managing medical conditions that are common after age 90. Certain conditions and lifestyle choices may make you more likely to have a fall. Your health care provider may recommend: Regular vision checks. Poor vision and conditions such as cataracts can make you more likely to have a fall. If you wear glasses, make sure to get your prescription updated if your vision changes. Medicine review. Work with your health care provider to regularly review all of the medicines you are taking, including over-the-counter medicines. Ask your health care provider about any side effects that may make you more likely to have a fall. Tell your health care provider if any medicines that you take make you feel dizzy or sleepy. Strength and balance checks. Your health care provider may recommend certain tests to check your strength and balance while standing, walking, or changing positions. Foot health exam. Foot pain and numbness, as well as not wearing proper footwear, can make you more likely to have a fall. Screenings, including: Osteoporosis screening. Osteoporosis is a condition that causes  the bones to get weaker and break more easily. Blood pressure screening. Blood pressure changes and medicines to control blood pressure can make you feel dizzy. Depression screening. You may be more likely to have a fall if you have a fear of falling, feel depressed, or feel unable to do activities that you used to do. Alcohol  use screening. Using too much alcohol  can affect your balance and may make you more likely to have a fall. Follow these instructions at home: Lifestyle Do not drink alcohol  if: Your health care provider tells you not to drink. If you drink alcohol : Limit how much you have to: 0-1 drink a day for women. 0-2 drinks a day for men. Know how much alcohol  is in your drink. In the [place] one drink equals one 12 oz bottle of beer (355 mL), one 5 oz glass of wine (148 mL), or one 1 oz glass of hard liquor (44 mL). Do not use any products that contain nicotine or tobacco. These products include cigarettes, chewing tobacco, and vaping devices, such as e-cigarettes. If you need help quitting, ask your health care provider. Activity  Follow a regular exercise program to stay fit. This will help you maintain your balance. Ask your health care provider what types of exercise are appropriate for you. If you need a cane or walker, use it as recommended by your health care provider. Wear supportive shoes that have nonskid soles. Safety  Remove any tripping hazards, such as rugs, cords, and clutter. Install safety equipment such as grab bars in bathrooms and safety rails on stairs. Keep rooms and walkways  well-lit. General instructions Talk with your health care provider about your risks for falling. Tell your health care provider if: You fall. Be sure to tell your health care provider about all falls, even ones that seem minor. You feel dizzy, tiredness (fatigue), or off-balance. Take over-the-counter and prescription medicines only as told by your health care provider. These include  supplements. Eat a healthy diet and maintain a healthy weight. A healthy diet includes low-fat dairy products, low-fat (lean) meats, and fiber from whole grains, beans, and lots of fruits and vegetables. Stay current with your vaccines. Schedule regular health, dental, and eye exams. Summary Having a healthy lifestyle and getting preventive care can help to protect your health and wellness after age 15. Screening and testing are the best way to find a health problem early and help you avoid having a fall. Early diagnosis and treatment give you the best chance for managing medical conditions that are more common for people who are older than age 42. Falls are a major cause of broken bones and head injuries in people who are older than age 64. Take precautions to prevent a fall at home. Work with your health care provider to learn what changes you can make to improve your health and wellness and to prevent falls. This information is not intended to replace advice given to you by your health care provider. Make sure you discuss any questions you have with your health care provider. Document Revised: 07/29/2020 Document Reviewed: 07/29/2020 Elsevier Patient Education  2024 [employer].

## 2024-02-05 LAB — MICROALBUMIN / CREATININE URINE RATIO
Creatinine, Urine: 220 mg/dL
Microalb/Creat Ratio: 3 mg/g{creat} (ref 0–29)
Microalbumin, Urine: 6.7 ug/mL

## 2024-02-07 ENCOUNTER — Ambulatory Visit: Payer: Self-pay | Admitting: Family

## 2024-02-09 ENCOUNTER — Other Ambulatory Visit: Payer: Self-pay | Admitting: Family Medicine

## 2024-02-09 DIAGNOSIS — K219 Gastro-esophageal reflux disease without esophagitis: Secondary | ICD-10-CM

## 2024-02-12 ENCOUNTER — Other Ambulatory Visit: Payer: Self-pay | Admitting: Family

## 2024-02-12 DIAGNOSIS — R053 Chronic cough: Secondary | ICD-10-CM

## 2024-02-12 DIAGNOSIS — R0982 Postnasal drip: Secondary | ICD-10-CM

## 2024-02-14 ENCOUNTER — Telehealth: Payer: Self-pay | Admitting: Family Medicine

## 2024-02-14 NOTE — Telephone Encounter (Signed)
 Copied from CRM [id number]. Topic: Clinical - Prescription Issue >> Feb 14, 2024 10:38 AM Zane F wrote: Reason for CRM:   Prescription in Question: atenolol  (TENORMIN ) 50 MG tablet  Patient is calling in to get an update on why the listed prescription was denied a refill. Patient was informed that our end it doesn't appear the prescription was not denied but confirmed received by the patient's preferred pharmacy on 02/04/2024.   Please look into the matter for the patient and call her back with an update.   Preferred Pharmacy: [phone] - [place], [place] - [address] [place], [place] [address] Phone: [phone]  Fax: [phone]   Callback Number: [phone]

## 2024-02-14 NOTE — Telephone Encounter (Signed)
 Called and spoke with [facility] and patient just picked up on 11/6 and they put the other on file for patient. Called and spoke with patient and they care aware.

## 2024-03-24 ENCOUNTER — Ambulatory Visit: Payer: Self-pay

## 2024-04-07 ENCOUNTER — Ambulatory Visit (INDEPENDENT_AMBULATORY_CARE_PROVIDER_SITE_OTHER)

## 2024-04-07 VITALS — BP 137/81 | HR 55 | Temp 98.0°F | Ht 62.0 in | Wt 215.6 lb

## 2024-04-07 DIAGNOSIS — Z0000 Encounter for general adult medical examination without abnormal findings: Secondary | ICD-10-CM

## 2024-04-07 NOTE — Progress Notes (Signed)
 "  Chief Complaint  Patient presents with   Medicare Wellness     Subjective:   Jackie Ayala is a 64 y.o. female who presents for a Medicare Annual Wellness Visit.  Visit info / Clinical Intake: Medicare Wellness Visit Type:: Initial Annual Wellness Visit Persons participating in visit and providing information:: patient Medicare Wellness Visit Mode:: In-person (required for WTM) Interpreter Needed?: No Pre-visit prep was completed: yes AWV questionnaire completed by patient prior to visit?: no Living arrangements:: lives with spouse/significant other Patient's Overall Health Status Rating: very good Typical amount of pain: none Does pain affect daily life?: no Are you currently prescribed opioids?: no  Dietary Habits and Nutritional Risks How many meals a day?: 3 Eats fruit and vegetables daily?: yes Most meals are obtained by: preparing own meals In the last 2 weeks, have you had any of the following?: none Diabetic:: (!) yes Any non-healing wounds?: no Would you like to be referred to a Nutritionist or for Diabetic Management? : no  Functional Status Activities of Daily Living (to include ambulation/medication): Independent Ambulation: Independent Medication Administration: Independent Home Management (perform basic housework or laundry): Independent Manage your own finances?: yes Primary transportation is: driving Concerns about vision?: no *vision screening is required for WTM* (last ov 2025) Concerns about hearing?: no  Fall Screening Falls in the past year?: 0 Number of falls in past year: 0 Was there an injury with Fall?: 0 Fall Risk Category Calculator: 0 Patient Fall Risk Level: Low Fall Risk  Fall Risk Patient at Risk for Falls Due to: No Fall Risks Fall risk Follow up: Falls evaluation completed; Education provided  Home and Transportation Safety: All rugs have non-skid backing?: yes All stairs or steps have railings?: yes Grab bars in the bathtub  or shower?: yes Have non-skid surface in bathtub or shower?: yes Good home lighting?: yes Regular seat belt use?: yes Hospital stays in the last year:: no  Cognitive Assessment Difficulty concentrating, remembering, or making decisions? : no Will 6CIT or Mini Cog be Completed: yes What year is it?: 0 points What month is it?: 0 points Give patient an address phrase to remember (5 components): [address] [place] About what time is it?: 0 points Count backwards from 20 to 1: 0 points Say the months of the year in reverse: 0 points Repeat the address phrase from earlier: 0 points 6 CIT Score: 0 points  Advance Directives (For Healthcare) Does Patient Have a Medical Advance Directive?: Yes Type of Advance Directive: Healthcare Power of [place]; Living will Copy of Healthcare Power of Attorney in Chart?: Yes - validated most recent copy scanned in chart (See row information) Copy of Living Will in Chart?: Yes - validated most recent copy scanned in chart (See row information)  Reviewed/Updated  Reviewed/Updated: Reviewed All (Medical, Surgical, Family, Medications, Allergies, Care Teams, Patient Goals); Medical History; Surgical History; Family History; Medications; Allergies; Care Teams; Patient Goals    Allergies (verified) Other, Penicillins, and Sulfa antibiotics   Current Medications (verified) Outpatient Encounter Medications as of 04/07/2024  Medication Sig   ACCU-CHEK GUIDE TEST test strip USE TO CHECK SUGAR IN MORNING, AT NOON, AND AT BEDTIME   atenolol  (TENORMIN ) 50 MG tablet TAKE 1 TABLET BY MOUTH EVERY DAY   Blood Glucose Monitoring Suppl DEVI 1 each by Does not apply route in the morning, at noon, and at bedtime. May substitute to any manufacturer covered by patient's insurance.   cetirizine  (ZYRTEC ) 10 MG tablet TAKE 1 TABLET BY MOUTH  EVERY DAY   cholecalciferol (VITAMIN D3) 25 MCG (1000 UNIT) tablet Take 1,000 Units by mouth daily.   lisinopril  (ZESTRIL ) 20 MG  tablet Take 1 tablet (20 mg total) by mouth daily.   Omega-3 Fatty Acids (FISH OIL) 1000 MG CAPS Take 1 capsule by mouth.   omeprazole  (PRILOSEC) 20 MG capsule TAKE 1 CAPSULE BY MOUTH EVERY DAY   rosuvastatin  (CRESTOR ) 5 MG tablet Take 1 tablet (5 mg total) by mouth daily.   zinc gluconate 50 MG tablet Take 50 mg by mouth daily.   No facility-administered encounter medications on file as of 04/07/2024.    History: Past Medical History:  Diagnosis Date   Hypertension    Past Surgical History:  Procedure Laterality Date   ABDOMINAL HYSTERECTOMY  2004   ovaries remain   CHOLECYSTECTOMY     Family History  Problem Relation Age of Onset   Diabetes Mother    Breast cancer Mother 50   COPD Mother    Hypertension Mother    Heart disease Father        Heart attack   Social History   Occupational History   Not on file  Tobacco Use   Smoking status: Never   Smokeless tobacco: Never  Vaping Use   Vaping status: Never Used  Substance and Sexual Activity   Alcohol use: No   Drug use: No   Sexual activity: Not Currently    Birth control/protection: Surgical    Comment: Hyst/ovaries remain   Tobacco Counseling Counseling given: Not Answered  SDOH Screenings   Food Insecurity: No Food Insecurity (04/07/2024)  Housing: Unknown (04/07/2024)  Transportation Needs: No Transportation Needs (04/07/2024)  Utilities: Not At Risk (04/07/2024)  Depression (PHQ2-9): Low Risk (04/07/2024)  Physical Activity: Insufficiently Active (04/07/2024)  Social Connections: Socially Integrated (04/07/2024)  Stress: No Stress Concern Present (04/07/2024)  Tobacco Use: Low Risk (04/07/2024)  Health Literacy: Adequate Health Literacy (04/07/2024)   See flowsheets for full screening details  Depression Screen PHQ 2 & 9 Depression Scale- Over the past 2 weeks, how often have you been bothered by any of the following problems? Little interest or pleasure in doing things: 0 Feeling down, depressed, or  hopeless (PHQ Adolescent also includes...irritable): 0 PHQ-2 Total Score: 0 Trouble falling or staying asleep, or sleeping too much: 0 Feeling tired or having little energy: 0 Poor appetite or overeating (PHQ Adolescent also includes...weight loss): 0 Feeling bad about yourself - or that you are a failure or have let yourself or your family down: 0 Trouble concentrating on things, such as reading the newspaper or watching television (PHQ Adolescent also includes...like school work): 0 Moving or speaking so slowly that other people could have noticed. Or the opposite - being so fidgety or restless that you have been moving around a lot more than usual: 0 Thoughts that you would be better off dead, or of hurting yourself in some way: 0 PHQ-9 Total Score: 0 If you checked off any problems, how difficult have these problems made it for you to do your work, take care of things at home, or get along with other people?: Not difficult at all     Goals Addressed             This Visit's Progress    Stay Active and Independent               Objective:    There were no vitals filed for this visit. There is no height or weight on  file to calculate BMI.  Hearing/Vision screen No results found. Immunizations and Health Maintenance Health Maintenance  Topic Date Due   Pneumococcal Vaccine: 50+ Years (1 of 2 - PCV) Never done   COVID-19 Vaccine (1 - 2025-26 season) Never done   OPHTHALMOLOGY EXAM  12/08/2023   Zoster Vaccines- Shingrix (1 of 2) 05/06/2024 (Originally 03/27/2008)   Influenza Vaccine  06/20/2024 (Originally 10/22/2023)   Mammogram  02/03/2025 (Originally 09/19/2021)   Diabetic kidney evaluation - Urine ACR  08/03/2024   HEMOGLOBIN A1C  08/03/2024   Fecal DNA (Cologuard)  09/18/2024   Diabetic kidney evaluation - eGFR measurement  02/03/2025   FOOT EXAM  02/03/2025   Medicare Annual Wellness (AWV)  04/07/2025   Bone Density Scan  08/03/2025   DTaP/Tdap/Td (2 - Td or Tdap)  07/06/2027   Hepatitis C Screening  Completed   Meningococcal B Vaccine  Aged Out        Assessment/Plan:  This is a routine wellness examination for Enrique.  Patient Care Team: Lavell Bari LABOR, FNP as PCP - General (Nurse Practitioner) Vicci Mcardle, OD (Optometry)  I have personally reviewed and noted the following in the patients chart:   Medical and social history Use of alcohol, tobacco or illicit drugs  Current medications and supplements including opioid prescriptions. Functional ability and status Nutritional status Physical activity Advanced directives List of other physicians Hospitalizations, surgeries, and ER visits in previous 12 months Vitals Screenings to include cognitive, depression, and falls Referrals and appointments  No orders of the defined types were placed in this encounter.  In addition, I have reviewed and discussed with patient certain preventive protocols, quality metrics, and best practice recommendations. A written personalized care plan for preventive services as well as general preventive health recommendations were provided to patient.   Ozie Ned, CMA   04/07/2024   Return in 1 year (on 04/07/2025).  After Visit Summary: (In Person-Printed) AVS printed and given to the patient  Nurse Notes: Pt is aware and due the following: per pt declined getting any vaccines, pt had eye appt done incl diabetic eye exam--will request for report from [facility] Dr.  VERL

## 2024-04-07 NOTE — Patient Instructions (Signed)
 Jackie Ayala,  Thank you for taking the time for your Medicare Wellness Visit. I appreciate your continued commitment to your health goals. Please review the care plan we discussed, and feel free to reach out if I can assist you further.  Please note that Annual Wellness Visits do not include a physical exam. Some assessments may be limited, especially if the visit was conducted virtually. If needed, we may recommend an in-person follow-up with your provider.  Ongoing Care Seeing your primary care provider every 3 to 6 months helps us  monitor your health and provide consistent, personalized care.   Referrals If a referral was made during today's visit and you haven't received any updates within two weeks, please contact the referred provider directly to check on the status.  Recommended Screenings:  Health Maintenance  Topic Date Due   Medicare Annual Wellness Visit  Never done   Pneumococcal Vaccine for age over 31 (1 of 2 - PCV) Never done   COVID-19 Vaccine (1 - 2025-26 season) Never done   Eye exam for diabetics  12/08/2023   Zoster (Shingles) Vaccine (1 of 2) 05/06/2024*   Flu Shot  06/20/2024*   Breast Cancer Screening  02/03/2025*   Kidney health urinalysis for diabetes  08/03/2024   Hemoglobin A1C  08/03/2024   Cologuard (Stool DNA test)  09/18/2024   Yearly kidney function blood test for diabetes  02/03/2025   Complete foot exam   02/03/2025   Osteoporosis screening with Bone Density Scan  08/03/2025   DTaP/Tdap/Td vaccine (2 - Td or Tdap) 07/06/2027   Hepatitis C Screening  Completed   Meningitis B Vaccine  Aged Out  *Topic was postponed. The date shown is not the original due date.       04/07/2024    9:45 AM  Advanced Directives  Does Patient Have a Medical Advance Directive? Yes  Type of Estate Agent of [place];Living will  Copy of Healthcare Power of Attorney in Chart? Yes - validated most recent copy scanned in chart (See row information)     Vision: Annual vision screenings are recommended for early detection of glaucoma, cataracts, and diabetic retinopathy. These exams can also reveal signs of chronic conditions such as diabetes and high blood pressure.  Dental: Annual dental screenings help detect early signs of oral cancer, gum disease, and other conditions linked to overall health, including heart disease and diabetes.  Please see the attached documents for additional preventive care recommendations.

## 2024-08-04 ENCOUNTER — Ambulatory Visit: Admitting: Family

## 2025-04-09 ENCOUNTER — Ambulatory Visit

## 2025-04-10 ENCOUNTER — Ambulatory Visit
